# Patient Record
Sex: Male | Born: 1990
Health system: Southern US, Community
[De-identification: ages and names within clinical notes are randomized; demographics above are authoritative.]

## PROBLEM LIST (undated history)

## (undated) DIAGNOSIS — G8929 Other chronic pain: Secondary | ICD-10-CM

## (undated) DIAGNOSIS — F32A Depression, unspecified: Secondary | ICD-10-CM

## (undated) DIAGNOSIS — D696 Thrombocytopenia, unspecified: Secondary | ICD-10-CM

## (undated) DIAGNOSIS — G43909 Migraine, unspecified, not intractable, without status migrainosus: Secondary | ICD-10-CM

## (undated) DIAGNOSIS — D72819 Decreased white blood cell count, unspecified: Secondary | ICD-10-CM

## (undated) DIAGNOSIS — I10 Essential (primary) hypertension: Secondary | ICD-10-CM

## (undated) DIAGNOSIS — F419 Anxiety disorder, unspecified: Secondary | ICD-10-CM

## (undated) DIAGNOSIS — J45909 Unspecified asthma, uncomplicated: Secondary | ICD-10-CM

## (undated) DIAGNOSIS — IMO0002 Reserved for concepts with insufficient information to code with codable children: Secondary | ICD-10-CM

## (undated) DIAGNOSIS — M329 Systemic lupus erythematosus, unspecified: Secondary | ICD-10-CM

## (undated) DIAGNOSIS — M549 Dorsalgia, unspecified: Secondary | ICD-10-CM

## (undated) DIAGNOSIS — M069 Rheumatoid arthritis, unspecified: Secondary | ICD-10-CM

## (undated) DIAGNOSIS — R2990 Unspecified symptoms and signs involving the nervous system: Secondary | ICD-10-CM

## (undated) DIAGNOSIS — F329 Major depressive disorder, single episode, unspecified: Secondary | ICD-10-CM

## (undated) HISTORY — DX: Thrombocytopenia, unspecified: D69.6

## (undated) HISTORY — DX: Decreased white blood cell count, unspecified: D72.819

---
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
## 2013-09-04 ENCOUNTER — Telehealth: Payer: Self-pay | Admitting: Hematology & Oncology

## 2013-09-04 NOTE — Telephone Encounter (Signed)
I tried to call NEW PATIENT today to remind them of their appointment with Dr. Myna Hidalgo. Also, advised them to bring all medication bottles and insurance card information.  However, the number listed on file is disconnected at this time.

## 2013-09-07 ENCOUNTER — Encounter: Payer: Self-pay | Admitting: Hematology & Oncology

## 2013-09-07 ENCOUNTER — Ambulatory Visit (HOSPITAL_BASED_OUTPATIENT_CLINIC_OR_DEPARTMENT_OTHER): Payer: BC Managed Care – PPO | Admitting: Hematology & Oncology

## 2013-09-07 ENCOUNTER — Ambulatory Visit (HOSPITAL_BASED_OUTPATIENT_CLINIC_OR_DEPARTMENT_OTHER): Payer: BC Managed Care – PPO

## 2013-09-07 ENCOUNTER — Other Ambulatory Visit (HOSPITAL_BASED_OUTPATIENT_CLINIC_OR_DEPARTMENT_OTHER): Payer: BC Managed Care – PPO | Admitting: Lab

## 2013-09-07 VITALS — BP 126/64 | HR 61 | Temp 98.4°F | Resp 16 | Ht 64.0 in | Wt 191.0 lb

## 2013-09-07 DIAGNOSIS — D696 Thrombocytopenia, unspecified: Secondary | ICD-10-CM

## 2013-09-07 DIAGNOSIS — M329 Systemic lupus erythematosus, unspecified: Secondary | ICD-10-CM

## 2013-09-07 DIAGNOSIS — D72819 Decreased white blood cell count, unspecified: Secondary | ICD-10-CM

## 2013-09-07 HISTORY — DX: Decreased white blood cell count, unspecified: D72.819

## 2013-09-07 HISTORY — DX: Thrombocytopenia, unspecified: D69.6

## 2013-09-07 LAB — CBC WITH DIFFERENTIAL (CANCER CENTER ONLY)
BASO#: 0 10*3/uL (ref 0.0–0.2)
BASO%: 0 % (ref 0.0–2.0)
EOS%: 0.4 % (ref 0.0–7.0)
Eosinophils Absolute: 0 10*3/uL (ref 0.0–0.5)
HCT: 42.4 % (ref 38.7–49.9)
HGB: 15.3 g/dL (ref 13.0–17.1)
LYMPH#: 0.8 10*3/uL — ABNORMAL LOW (ref 0.9–3.3)
LYMPH%: 34.8 % (ref 14.0–48.0)
MCH: 30.8 pg (ref 28.0–33.4)
MCHC: 36.1 g/dL — ABNORMAL HIGH (ref 32.0–35.9)
MCV: 85 fL (ref 82–98)
MONO#: 0.2 10*3/uL (ref 0.1–0.9)
MONO%: 10.4 % (ref 0.0–13.0)
NEUT#: 1.3 10*3/uL — ABNORMAL LOW (ref 1.5–6.5)
NEUT%: 54.4 % (ref 40.0–80.0)
RBC: 4.97 10*6/uL (ref 4.20–5.70)
RDW: 11.4 % (ref 11.1–15.7)
WBC: 2.3 10*3/uL — AB (ref 4.0–10.0)

## 2013-09-07 LAB — CHCC SATELLITE - SMEAR

## 2013-09-07 LAB — TECHNOLOGIST REVIEW CHCC SATELLITE

## 2013-09-07 NOTE — Progress Notes (Signed)
Referral MD  Reason for Referral: Leukopenia and thrombocytopenia   Chief Complaint  Patient presents with  . NEW PATIENT  : I am here to see if I have lupus  HPI: [name] is a very nice 23 year old Afro-American gentleman. He apparently has a diagnosis of lupus. He is on Plaquenil and prednisone. He's been on these for about a year.  He has been noted to have some leukopenia and thrombocytopenia. He is followed by [facility]. He had some lab work done back in October and his white cell count was 2.4. Hemoglobin 14.2 and platelet count was 82. MCV was 86. He had a normal  white cell differential.  I don't have any additional lab work on him.  However, he was referred to [place] because of the blood counts and need out of [place] out of these.  He's had a problem infections. He's had no bleeding. He's had no bruises. He's had no change in medications.  He has been tested for HIV.  There is no on his blood disorder problems in the family. He does not know his father. He says mother was a foster child.  He's had no rashes. I think that the e lupus may have been diagnosed with he did have a facial rash.  Is no weight loss or weight gain. Is not a vegetarian. He's had no swollen lymph glands. He's had no cough. He does smoke. He does drink. He's had no leg swelling.   No past medical history on file.:  No past surgical history on file.:  Current outpatient prescriptions:HYDROXYCHLOROQUINE SULFATE PO, Take 200 mg by mouth every morning. 2 tabs in am = 400 mg, Disp: , Rfl: ;  prednisoLONE 5 MG TABS tablet, Take 5 mg by mouth daily., Disp: , Rfl: :  :  Allergies  Allergen Reactions  . Codeine Nausea And Vomiting    Pt thinks he is allergic to codeine--  :  No family history on file.:  History   Social History  . Marital Status: Single    Spouse Name: N/A    Number of Children: N/A  . Years of Education: N/A   Occupational History  . Not on file.    Social History Main Topics  . Smoking status: Current Every Day Smoker -- 2.00 packs/day for 3 years    Types: Cigars, Cigarettes    Start date: 04/09/2010  . Smokeless tobacco: Never Used     Comment: smokes 2 cigars daily  . Alcohol Use: Not on file  . Drug Use: Not on file  . Sexual Activity: Not on file   Other Topics Concern  . Not on file   Social History Narrative  . No narrative on file  :  Pertinent items are noted in HPI.  Exam: @IPVITALS @  well-developed and well-nourished Afro-American gentleman. His vital signs show a temperature of 98.4. Pulse 61. Blood pressure 126/64. Weight is 191 pounds. Head and neck exam shows Dr. oral lesions. Is no scleral icterus. There is no adenopathy in the neck. Lungs are clear. Cardiac exam regular in rhythm with no murmurs rubs or bruits. Abdomen is soft. Has good bowel sounds. There is no fluid wave. No palpable abdominal mass. There is no palpable liver or spleen tip. Back exam no tenderness over the spine ribs or hips. Extremities shows no clubbing cyanosis or edema. His good range of motion of his joints. Skin exam no rashes ecchymoses or petechia. Neurological exam shows no focal neurological deficits.  Recent Labs  09/07/13 1459  WBC 2.3*  HGB 15.3  HCT 42.4  PLT 113 Platelet count consistent in citrate*   No results found for this basename: NA, K, CL, CO2, GLUCOSE, BUN, CREATININE, CALCIUM,  in the last 72 hours  Blood smear review: Normochromic and normocytic red blood cells. There are no nucleated red cells. I see no rouleau formation. He has no target cells. I see no teardrop cells. There are no schistocytes or spherocytes. White cells are decreased in number. He has mature polys. I see no hypersegmented polys. There is no blasts. I see a couple atypical lymphocytes. Plans are mildly decreased in number. He has several large platelets. Platelets are well- granulated  Pathology: No data     Assessment and Plan: Mr.  Dan Adams is a very nice 23 year old African gentleman. He has lupus. He's on Plaquenil and prednisone.  His blood smear and is a pretty much unremarkable. He does have a leukopenia. I suspect that the leukopenia probably is more so related to his ethnicity. 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 This is a true hematologic disorder seen in about 20-25% of African Americans. It is not any type of precursor for any additional problems.  The thrombocytopenia and certainly could be from a collagen vascular disorder. By his blood smear, [place] think that he has a destructive or consumptive process. He has some large platelets. They are normal in appearance.  For now, he is a symptomatic. I don't see that his blood counts are going to be a factor. I don't believe that he would ever be syndromatic from the low white cells or low platelets.  He did not get a bone marrow test. I would not do any type of radiological studies. His exam premixes on reviewing.  I do want to see him back in about 3 months or so. I think if his blood counts are okay at that point on, and then we can probably let him go from the clinic.  I spent a good 45 minutes with him. I reviewed his lab work with him. I answered all his questions.

## 2013-12-09 ENCOUNTER — Other Ambulatory Visit: Payer: BC Managed Care – PPO | Admitting: Lab

## 2013-12-09 ENCOUNTER — Ambulatory Visit: Payer: BC Managed Care – PPO | Admitting: Hematology & Oncology

## 2014-08-09 ENCOUNTER — Telehealth: Payer: Self-pay | Admitting: Internal Medicine

## 2014-08-09 NOTE — Telephone Encounter (Signed)
OK. Thx

## 2014-08-09 NOTE — Telephone Encounter (Signed)
Patient's mother is a patient of yours and she would like to schedule her son with you as a new patient. He has been diagnosed with Lupus and needs a PCP.

## 2014-08-13 ENCOUNTER — Ambulatory Visit (INDEPENDENT_AMBULATORY_CARE_PROVIDER_SITE_OTHER): Payer: 59 | Admitting: Internal Medicine

## 2014-08-13 ENCOUNTER — Encounter: Payer: Self-pay | Admitting: Internal Medicine

## 2014-08-13 ENCOUNTER — Other Ambulatory Visit (INDEPENDENT_AMBULATORY_CARE_PROVIDER_SITE_OTHER): Payer: 59

## 2014-08-13 VITALS — BP 130/82 | HR 109 | Temp 98.9°F | Ht 64.0 in | Wt 186.0 lb

## 2014-08-13 DIAGNOSIS — E538 Deficiency of other specified B group vitamins: Secondary | ICD-10-CM

## 2014-08-13 DIAGNOSIS — M329 Systemic lupus erythematosus, unspecified: Secondary | ICD-10-CM

## 2014-08-13 DIAGNOSIS — E559 Vitamin D deficiency, unspecified: Secondary | ICD-10-CM | POA: Diagnosis not present

## 2014-08-13 LAB — URINALYSIS, ROUTINE W REFLEX MICROSCOPIC
Bilirubin Urine: NEGATIVE
Ketones, ur: NEGATIVE
Leukocytes, UA: NEGATIVE
Nitrite: NEGATIVE
PH: 6 (ref 5.0–8.0)
Specific Gravity, Urine: >=1.03 — AB (ref 1.000–1.030)
TOTAL PROTEIN, URINE-UPE24: 30 — AB
URINE GLUCOSE: NEGATIVE
Urobilinogen, UA: 0.2 (ref 0.0–1.0)

## 2014-08-13 LAB — HEPATIC FUNCTION PANEL
ALK PHOS: 45 U/L (ref 39–117)
ALT: 21 U/L (ref 0–53)
AST: 20 U/L (ref 0–37)
Albumin: 3.9 g/dL (ref 3.5–5.2)
Bilirubin, Direct: 0.1 mg/dL (ref 0.0–0.3)
TOTAL PROTEIN: 7.5 g/dL (ref 6.0–8.3)
Total Bilirubin: 0.6 mg/dL (ref 0.2–1.2)

## 2014-08-13 LAB — CBC WITH DIFFERENTIAL/PLATELET
BASOS PCT: 0.2 % (ref 0.0–3.0)
Basophils Absolute: 0 10*3/uL (ref 0.0–0.1)
EOS ABS: 0 10*3/uL (ref 0.0–0.7)
Eosinophils Relative: 0.1 % (ref 0.0–5.0)
HEMATOCRIT: 40 % (ref 39.0–52.0)
HEMOGLOBIN: 13.8 g/dL (ref 13.0–17.0)
LYMPHS ABS: 0.3 10*3/uL — AB (ref 0.7–4.0)
Lymphocytes Relative: 11.3 % — ABNORMAL LOW (ref 12.0–46.0)
MCHC: 34.5 g/dL (ref 30.0–36.0)
MCV: 85.5 fl (ref 78.0–100.0)
MONO ABS: 0.2 10*3/uL (ref 0.1–1.0)
Monocytes Relative: 5.7 % (ref 3.0–12.0)
Neutro Abs: 2.5 10*3/uL (ref 1.4–7.7)
Neutrophils Relative %: 82.7 % — ABNORMAL HIGH (ref 43.0–77.0)
Platelets: 263 10*3/uL (ref 150.0–400.0)
RBC: 4.67 Mil/uL (ref 4.22–5.81)
RDW: 12.8 % (ref 11.5–15.5)
WBC: 3 10*3/uL — ABNORMAL LOW (ref 4.0–10.5)

## 2014-08-13 LAB — VITAMIN D 25 HYDROXY (VIT D DEFICIENCY, FRACTURES): VITD: 13.3 ng/mL — ABNORMAL LOW (ref 30.00–100.00)

## 2014-08-13 LAB — BASIC METABOLIC PANEL
BUN: 18 mg/dL (ref 6–23)
CO2: 26 meq/L (ref 19–32)
CREATININE: 1.02 mg/dL (ref 0.40–1.50)
Calcium: 9.3 mg/dL (ref 8.4–10.5)
Chloride: 104 mEq/L (ref 96–112)
GFR: 114.62 mL/min (ref >60.00)
GLUCOSE: 102 mg/dL — AB (ref 70–99)
Potassium: 3.6 mEq/L (ref 3.5–5.1)
SODIUM: 139 meq/L (ref 135–145)

## 2014-08-13 LAB — VITAMIN B12: Vitamin B-12: 163 pg/mL — ABNORMAL LOW (ref 211–911)

## 2014-08-13 LAB — SEDIMENTATION RATE: Sed Rate: 41 mm/hr — ABNORMAL HIGH (ref 0–22)

## 2014-08-13 LAB — TSH: TSH: 1.13 u[IU]/mL (ref 0.35–4.50)

## 2014-08-13 NOTE — Progress Notes (Signed)
Pre visit review using our clinic review tool, if applicable. No additional management support is needed unless otherwise documented below in the visit note. 

## 2014-08-13 NOTE — Assessment & Plan Note (Addendum)
Lupus was dx'd in 2014. His rheumatologist is Dr Sharmon Revere in [facility]  Labs CXR EKG

## 2014-08-13 NOTE — Progress Notes (Signed)
Subjective:    Patient ID: Dan Adams, male    DOB: 05/21/1990, 24 y.o.   MRN: [id number]  HPI  New pt C/o "I have lupus" - needs a PCP (mom - Valerie)  Hx: Pt was loosing hair and had a face rash. He had skin bx by his dermatologist - lupus was dx'd in 2014. Pt developed itchy skin rash 2 wks ago on B arms - he needs an IV med;  He needs a PCP to monitor side effect. His rheumatologist is Dr Sharmon Revere in [facility]  C/o blurry vision x 1 wk - saw an eye dr - Dr Mardene Sayer in [place] - repeat exam on 5/25  Review of Systems  Constitutional: Positive for fatigue. Negative for chills, appetite change and unexpected weight change.  HENT: Negative for congestion, nosebleeds, sneezing, sore throat, trouble swallowing and voice change.   Eyes: Negative for itching and visual disturbance.  Respiratory: Negative for cough.   Cardiovascular: Negative for chest pain, palpitations and leg swelling.  Gastrointestinal: Negative for nausea, diarrhea, blood in stool and abdominal distention.  Genitourinary: Negative for frequency and hematuria.  Musculoskeletal: Positive for back pain, joint swelling and arthralgias. Negative for gait problem and neck pain.  Skin: Positive for rash.  Neurological: Negative for dizziness, tremors, speech difficulty and weakness.  Psychiatric/Behavioral: Negative for suicidal ideas, sleep disturbance, dysphoric mood and agitation. The patient is not nervous/anxious.    BP 130/82 mmHg  Pulse 109  Temp(Src) 98.9 F (37.2 C) (Oral)  Ht 5\' 4"  (1.626 m)  Wt 186 lb (84.369 kg)  BMI 31.91 kg/m2  SpO2 98%     Objective:   Physical Exam  Constitutional: He is oriented to person, place, and time. He appears well-developed. No distress.  NAD Obese a little  HENT:  Mouth/Throat: Oropharynx is clear and moist.  Eyes: Conjunctivae are normal. Pupils are equal, round, and reactive to light.  Neck: Normal range of motion. No JVD present. No thyromegaly present.    Cardiovascular: Normal rate, regular rhythm, normal heart sounds and intact distal pulses.  Exam reveals no gallop and no friction rub.   No murmur heard. Pulmonary/Chest: Effort normal and breath sounds normal. No respiratory distress. He has no wheezes. He has no rales. He exhibits no tenderness.  Abdominal: Soft. Bowel sounds are normal. He exhibits no distension and no mass. There is no tenderness. There is no rebound and no guarding.  Musculoskeletal: Normal range of motion. He exhibits tenderness. He exhibits no edema.  Lymphadenopathy:    He has no cervical adenopathy.  Neurological: He is alert and oriented to person, place, and time. He has normal reflexes. No cranial nerve deficit. He exhibits normal muscle tone. He displays a negative Romberg sign. Coordination and gait normal.  Skin: Skin is warm and dry. Rash noted.  Psychiatric: He has a normal mood and affect. His behavior is normal. Judgment and thought content normal.  There is papular-pustular rash on arms and forearms Multi-joints are tender LS is tender Nails discolored   Lab Results  Component Value Date   WBC 3.0* 08/13/2014   HGB 13.8 08/13/2014   HCT 40.0 08/13/2014   PLT 263.0 08/13/2014   GLUCOSE 102* 08/13/2014   ALT 21 08/13/2014   AST 20 08/13/2014   NA 139 08/13/2014   K 3.6 08/13/2014   CL 104 08/13/2014   CREATININE 1.02 08/13/2014   BUN 18 08/13/2014   CO2 26 08/13/2014   TSH 1.13 08/13/2014   Vit B12 163  Vit D 13.30  A complex case I spent >45 minutes with the patient and more than 50% of time was spent in counseling and coordination of care      Assessment & Plan:

## 2014-08-16 ENCOUNTER — Other Ambulatory Visit: Payer: Self-pay | Admitting: Internal Medicine

## 2014-08-16 MED ORDER — VITAMIN D 1000 UNITS PO TABS: 1000.0000 [IU] | ORAL_TABLET | Freq: Every day | ORAL | Status: DC

## 2014-08-16 MED ORDER — ERGOCALCIFEROL 1.25 MG (50000 UT) PO CAPS: 50000.0000 [IU] | ORAL_CAPSULE | ORAL | Status: DC

## 2014-08-16 MED ORDER — VITAMIN B-12 1000 MCG SL SUBL: 1.0000 | SUBLINGUAL_TABLET | Freq: Every day | SUBLINGUAL | Status: DC

## 2014-08-17 ENCOUNTER — Encounter: Payer: Self-pay | Admitting: Internal Medicine

## 2014-08-17 DIAGNOSIS — E559 Vitamin D deficiency, unspecified: Secondary | ICD-10-CM | POA: Insufficient documentation

## 2014-08-17 DIAGNOSIS — E538 Deficiency of other specified B group vitamins: Secondary | ICD-10-CM | POA: Insufficient documentation

## 2014-08-17 NOTE — Assessment & Plan Note (Signed)
5/16 new Start Vit B12 SL

## 2014-09-27 ENCOUNTER — Ambulatory Visit: Payer: 59 | Admitting: Internal Medicine

## 2014-10-13 ENCOUNTER — Ambulatory Visit (INDEPENDENT_AMBULATORY_CARE_PROVIDER_SITE_OTHER): Payer: 59 | Admitting: Internal Medicine

## 2014-10-13 ENCOUNTER — Encounter (INDEPENDENT_AMBULATORY_CARE_PROVIDER_SITE_OTHER): Payer: Self-pay

## 2014-10-13 ENCOUNTER — Encounter: Payer: Self-pay | Admitting: Internal Medicine

## 2014-10-13 VITALS — BP 152/94 | HR 83 | Temp 98.1°F | Resp 16 | Ht 65.0 in | Wt 191.1 lb

## 2014-10-13 DIAGNOSIS — E559 Vitamin D deficiency, unspecified: Secondary | ICD-10-CM | POA: Diagnosis not present

## 2014-10-13 DIAGNOSIS — E538 Deficiency of other specified B group vitamins: Secondary | ICD-10-CM

## 2014-10-13 DIAGNOSIS — I159 Secondary hypertension, unspecified: Secondary | ICD-10-CM | POA: Diagnosis not present

## 2014-10-13 MED ORDER — VITAMIN D3 50 MCG (2000 UT) PO CAPS: 2000.0000 [IU] | ORAL_CAPSULE | Freq: Every day | ORAL | Status: DC

## 2014-10-13 MED ORDER — LOSARTAN POTASSIUM 100 MG PO TABS: 100.0000 mg | ORAL_TABLET | Freq: Every day | ORAL | Status: DC

## 2014-10-13 MED ORDER — VITAMIN B-12 1000 MCG SL SUBL: 1.0000 | SUBLINGUAL_TABLET | Freq: Every day | SUBLINGUAL | Status: DC

## 2014-10-13 NOTE — Patient Instructions (Signed)

## 2014-10-13 NOTE — Progress Notes (Signed)
Subjective:  Patient ID: Dan Adams, male    DOB: 09/22/1990  Age: 24 y.o. MRN: [id number]  CC: No chief complaint on file.   HPI Durrell Sheley presents for elev SBP 160 earlier today while having a "lupus infusion". Pt was given Clonidine. Pt ran out of Vitamins...  Outpatient Prescriptions Prior to Visit  Medication Sig Dispense Refill  . HYDROXYCHLOROQUINE SULFATE PO Take 200 mg by mouth every morning. 2 tabs in am = 400 mg    . prednisoLONE 5 MG TABS tablet Take 5 mg by mouth daily.    . ergocalciferol (VITAMIN D2) 50000 UNITS capsule Take 1 capsule (50,000 Units total) by mouth once a week. (Patient not taking: Reported on 10/13/2014) 6 capsule 0  . cholecalciferol (VITAMIN D) 1000 UNITS tablet Take 1 tablet (1,000 Units total) by mouth daily. (Patient not taking: Reported on 10/13/2014) 100 tablet 3  . Cyanocobalamin (VITAMIN B-12) 1000 MCG SUBL Place 1 tablet (1,000 mcg total) under the tongue daily. (Patient not taking: Reported on 10/13/2014) 100 tablet 3   No facility-administered medications prior to visit.    ROS Review of Systems  Constitutional: Positive for fatigue. Negative for appetite change and unexpected weight change.  HENT: Negative for congestion, nosebleeds, sneezing, sore throat and trouble swallowing.   Eyes: Negative for itching and visual disturbance.  Respiratory: Negative for cough.   Cardiovascular: Negative for chest pain, palpitations and leg swelling.  Gastrointestinal: Negative for nausea, diarrhea, blood in stool and abdominal distention.  Genitourinary: Negative for frequency and hematuria.  Musculoskeletal: Negative for back pain, joint swelling, gait problem and neck pain.  Skin: Negative for rash.  Neurological: Negative for dizziness, tremors, speech difficulty and weakness.  Psychiatric/Behavioral: Negative for suicidal ideas, sleep disturbance, dysphoric mood and agitation. The patient is not nervous/anxious.     Objective:  BP  152/94 mmHg  Pulse 83  Temp(Src) 98.1 F (36.7 C) (Oral)  Resp 16  Ht 5\' 5"  (1.651 m)  Wt 191 lb 1.3 oz (86.673 kg)  BMI 31.80 kg/m2  SpO2 99%  BP Readings from Last 3 Encounters:  10/13/14 152/94  08/13/14 130/82  09/07/13 126/64    Wt Readings from Last 3 Encounters:  10/13/14 191 lb 1.3 oz (86.673 kg)  08/13/14 186 lb (84.369 kg)  09/07/13 191 lb (86.637 kg)    Physical Exam  Constitutional: He is oriented to person, place, and time. He appears well-developed. No distress.  Obese NAD  HENT:  Mouth/Throat: Oropharynx is clear and moist.  Eyes: Conjunctivae are normal. Pupils are equal, round, and reactive to light.  Neck: Normal range of motion. No JVD present. No thyromegaly present.  Cardiovascular: Normal rate, regular rhythm, normal heart sounds and intact distal pulses.  Exam reveals no gallop and no friction rub.   No murmur heard. Pulmonary/Chest: Effort normal and breath sounds normal. No respiratory distress. He has no wheezes. He has no rales. He exhibits no tenderness.  Abdominal: Soft. Bowel sounds are normal. He exhibits no distension and no mass. There is no tenderness. There is no rebound and no guarding.  Musculoskeletal: Normal range of motion. He exhibits no edema or tenderness.  Lymphadenopathy:    He has no cervical adenopathy.  Neurological: He is alert and oriented to person, place, and time. He has normal reflexes. No cranial nerve deficit. He exhibits normal muscle tone. He displays a negative Romberg sign. Coordination and gait normal.  Skin: Skin is warm and dry. No rash noted.  Psychiatric: He has  a normal mood and affect. His behavior is normal. Judgment and thought content normal.    Lab Results  Component Value Date   WBC 3.0* 08/13/2014   HGB 13.8 08/13/2014   HCT 40.0 08/13/2014   PLT 263.0 08/13/2014   GLUCOSE 102* 08/13/2014   ALT 21 08/13/2014   AST 20 08/13/2014   NA 139 08/13/2014   K 3.6 08/13/2014   CL 104 08/13/2014    CREATININE 1.02 08/13/2014   BUN 18 08/13/2014   CO2 26 08/13/2014   TSH 1.13 08/13/2014    No results found.  Assessment & Plan:   Diagnoses and all orders for this visit:  Vitamin B12 deficiency  Vitamin D deficiency  Hypertension, secondary  Other orders -     losartan (COZAAR) 100 MG tablet; Take 1 tablet (100 mg total) by mouth daily. -     Cyanocobalamin (VITAMIN B-12) 1000 MCG SUBL; Place 1 tablet (1,000 mcg total) under the tongue daily. -     Cholecalciferol (VITAMIN D3) 2000 UNITS capsule; Take 1 capsule (2,000 Units total) by mouth daily.  I have discontinued Mr. Macneal's cholecalciferol. I am also having him start on losartan and Vitamin D3. Additionally, I am having him maintain his prednisoLONE, HYDROXYCHLOROQUINE SULFATE PO, ergocalciferol, and Vitamin B-12.  Meds ordered this encounter  Medications  . losartan (COZAAR) 100 MG tablet    Sig: Take 1 tablet (100 mg total) by mouth daily.    Dispense:  30 tablet    Refill:  11  . Cyanocobalamin (VITAMIN B-12) 1000 MCG SUBL    Sig: Place 1 tablet (1,000 mcg total) under the tongue daily.    Dispense:  100 tablet    Refill:  3  . Cholecalciferol (VITAMIN D3) 2000 UNITS capsule    Sig: Take 1 capsule (2,000 Units total) by mouth daily.    Dispense:  100 capsule    Refill:  3     Follow-up: No Follow-up on file.  Sonda Primes, MD

## 2014-10-13 NOTE — Assessment & Plan Note (Signed)
Losartan 100 mg/d Labs in 2 wks

## 2014-10-13 NOTE — Assessment & Plan Note (Signed)
over-the-counter Vit D 2000 iu daily. Risks associated with treatment noncompliance were discussed. Compliance was encouraged.

## 2014-10-13 NOTE — Progress Notes (Signed)
Pre visit review using our clinic review tool, if applicable. No additional management support is needed unless otherwise documented below in the visit note. 

## 2014-10-13 NOTE — Assessment & Plan Note (Signed)
Risks associated with treatment noncompliance were discussed. Compliance was encouraged. 

## 2014-10-15 ENCOUNTER — Ambulatory Visit: Payer: 59 | Admitting: Internal Medicine

## 2014-10-27 ENCOUNTER — Ambulatory Visit (INDEPENDENT_AMBULATORY_CARE_PROVIDER_SITE_OTHER): Payer: 59 | Admitting: Internal Medicine

## 2014-10-27 ENCOUNTER — Encounter: Payer: Self-pay | Admitting: Internal Medicine

## 2014-10-27 ENCOUNTER — Other Ambulatory Visit (INDEPENDENT_AMBULATORY_CARE_PROVIDER_SITE_OTHER): Payer: 59

## 2014-10-27 VITALS — BP 164/92 | HR 95 | Temp 99.0°F | Resp 16 | Ht 64.0 in | Wt 187.0 lb

## 2014-10-27 DIAGNOSIS — M329 Systemic lupus erythematosus, unspecified: Secondary | ICD-10-CM

## 2014-10-27 DIAGNOSIS — E538 Deficiency of other specified B group vitamins: Secondary | ICD-10-CM

## 2014-10-27 DIAGNOSIS — I159 Secondary hypertension, unspecified: Secondary | ICD-10-CM

## 2014-10-27 DIAGNOSIS — E559 Vitamin D deficiency, unspecified: Secondary | ICD-10-CM

## 2014-10-27 LAB — URINALYSIS, ROUTINE W REFLEX MICROSCOPIC
BILIRUBIN URINE: NEGATIVE
Ketones, ur: NEGATIVE
LEUKOCYTES UA: NEGATIVE
Nitrite: NEGATIVE
Total Protein, Urine: 100 — AB
URINE GLUCOSE: NEGATIVE
UROBILINOGEN UA: 0.2 (ref 0.0–1.0)
pH: 6 (ref 5.0–8.0)

## 2014-10-27 MED ORDER — AMLODIPINE BESYLATE 5 MG PO TABS: 5.0000 mg | ORAL_TABLET | Freq: Every day | ORAL | Status: DC

## 2014-10-27 MED ORDER — VITAMIN B-12 1000 MCG SL SUBL: 1.0000 | SUBLINGUAL_TABLET | Freq: Every day | SUBLINGUAL | Status: DC

## 2014-10-27 MED ORDER — VITAMIN D3 50 MCG (2000 UT) PO CAPS: 2000.0000 [IU] | ORAL_CAPSULE | Freq: Every day | ORAL | Status: DC

## 2014-10-27 NOTE — Assessment & Plan Note (Signed)
Re- start Vit B12 °

## 2014-10-27 NOTE — Progress Notes (Signed)
Pre visit review using our clinic review tool, if applicable. No additional management support is needed unless otherwise documented below in the visit note. 

## 2014-10-27 NOTE — Assessment & Plan Note (Signed)
Tapering off Prednisone

## 2014-10-27 NOTE — Assessment & Plan Note (Signed)
Re-start Vit D 

## 2014-10-27 NOTE — Progress Notes (Signed)
Subjective:  Patient ID: Dan Adams, male    DOB: May 18, 1990  Age: 24 y.o. MRN: [id number]  CC: No chief complaint on file.   HPI Durrell Benecke presents for HTN. Prednisone is making BP go up. He is on 1 a day - planning to stop it per Rheumatology. C/o side effects w/Losartan - HA.  Pt is taking Zpac for bronchitis. He is concerned about having a UTI Pt is not taking Vit D, B12 for ?reason  Outpatient Prescriptions Prior to Visit  Medication Sig Dispense Refill  . HYDROXYCHLOROQUINE SULFATE PO Take 200 mg by mouth every morning. 2 tabs in am = 400 mg    . losartan (COZAAR) 100 MG tablet Take 1 tablet (100 mg total) by mouth daily. 30 tablet 11  . prednisoLONE 5 MG TABS tablet Take 5 mg by mouth daily.    . ergocalciferol (VITAMIN D2) 50000 UNITS capsule Take 1 capsule (50,000 Units total) by mouth once a week. (Patient not taking: Reported on 10/13/2014) 6 capsule 0  . Cholecalciferol (VITAMIN D3) 2000 UNITS capsule Take 1 capsule (2,000 Units total) by mouth daily. (Patient not taking: Reported on 10/27/2014) 100 capsule 3  . Cyanocobalamin (VITAMIN B-12) 1000 MCG SUBL Place 1 tablet (1,000 mcg total) under the tongue daily. (Patient not taking: Reported on 10/27/2014) 100 tablet 3   No facility-administered medications prior to visit.    ROS Review of Systems  Constitutional: Negative for appetite change, fatigue and unexpected weight change.  HENT: Negative for congestion, nosebleeds, sneezing, sore throat and trouble swallowing.   Eyes: Negative for itching and visual disturbance.  Respiratory: Positive for cough.   Cardiovascular: Negative for chest pain, palpitations and leg swelling.  Gastrointestinal: Negative for nausea, diarrhea, blood in stool and abdominal distention.  Genitourinary: Negative for frequency and hematuria.  Musculoskeletal: Negative for back pain, joint swelling, gait problem and neck pain.  Skin: Negative for rash.  Neurological: Negative for  dizziness, tremors, speech difficulty and weakness.  Psychiatric/Behavioral: Negative for sleep disturbance, dysphoric mood and agitation. The patient is not nervous/anxious.     Objective:  BP 164/92 mmHg  Pulse 95  Temp(Src) 99 F (37.2 C) (Oral)  Resp 16  Ht 5\' 4"  (1.626 m)  Wt 187 lb (84.823 kg)  BMI 32.08 kg/m2  SpO2 98%  BP Readings from Last 3 Encounters:  10/27/14 164/92  10/13/14 152/94  08/13/14 130/82    Wt Readings from Last 3 Encounters:  10/27/14 187 lb (84.823 kg)  10/13/14 191 lb 1.3 oz (86.673 kg)  08/13/14 186 lb (84.369 kg)    Physical Exam  Constitutional: He is oriented to person, place, and time. He appears well-developed. No distress.  NAD  HENT:  Mouth/Throat: Oropharynx is clear and moist. No oropharyngeal exudate.  Eyes: Conjunctivae are normal. Pupils are equal, round, and reactive to light.  Neck: Normal range of motion. No JVD present. No thyromegaly present.  Cardiovascular: Normal rate, regular rhythm, normal heart sounds and intact distal pulses.  Exam reveals no gallop and no friction rub.   No murmur heard. Pulmonary/Chest: Effort normal and breath sounds normal. No respiratory distress. He has no wheezes. He has no rales. He exhibits no tenderness.  Abdominal: Soft. Bowel sounds are normal. He exhibits no distension and no mass. There is no tenderness. There is no rebound and no guarding.  Musculoskeletal: Normal range of motion. He exhibits no edema or tenderness.  Lymphadenopathy:    He has no cervical adenopathy.  Neurological: He is  alert and oriented to person, place, and time. He has normal reflexes. No cranial nerve deficit. He exhibits normal muscle tone. He displays a negative Romberg sign. Coordination and gait normal.  Skin: Skin is warm and dry. Rash noted.  Psychiatric: He has a normal mood and affect. His behavior is normal. Judgment and thought content normal.  dry skin  Lab Results  Component Value Date   WBC 3.0*  08/13/2014   HGB 13.8 08/13/2014   HCT 40.0 08/13/2014   PLT 263.0 08/13/2014   GLUCOSE 102* 08/13/2014   ALT 21 08/13/2014   AST 20 08/13/2014   NA 139 08/13/2014   K 3.6 08/13/2014   CL 104 08/13/2014   CREATININE 1.02 08/13/2014   BUN 18 08/13/2014   CO2 26 08/13/2014   TSH 1.13 08/13/2014    No results found.  Assessment & Plan:   Diagnoses and all orders for this visit:  Hypertension, secondary Orders: -     Urinalysis; Future  Lupus (systemic lupus erythematosus)  Vitamin D deficiency  Vitamin B12 deficiency  Other orders -     amLODipine (NORVASC) 5 MG tablet; Take 1 tablet (5 mg total) by mouth daily. -     Cyanocobalamin (VITAMIN B-12) 1000 MCG SUBL; Place 1 tablet (1,000 mcg total) under the tongue daily. -     Cholecalciferol (VITAMIN D3) 2000 UNITS capsule; Take 1 capsule (2,000 Units total) by mouth daily.  I have discontinued Mr. Frieling's prednisoLONE and losartan. I am also having him start on amLODipine. Additionally, I am having him maintain his HYDROXYCHLOROQUINE SULFATE PO, ergocalciferol, azithromycin, HYDROcodone-homatropine, Vitamin B-12, and Vitamin D3.  Meds ordered this encounter  Medications  . azithromycin (ZITHROMAX Z-PAK) 250 MG tablet    Sig: Take 250 mg by mouth.  Marland Kitchen HYDROcodone-homatropine (HYCODAN) 5-1.5 MG/5ML syrup    Sig: Take 5 mLs by mouth every 4 (four) hours as needed.   Marland Kitchen amLODipine (NORVASC) 5 MG tablet    Sig: Take 1 tablet (5 mg total) by mouth daily.    Dispense:  30 tablet    Refill:  11  . Cyanocobalamin (VITAMIN B-12) 1000 MCG SUBL    Sig: Place 1 tablet (1,000 mcg total) under the tongue daily.    Dispense:  100 tablet    Refill:  3  . Cholecalciferol (VITAMIN D3) 2000 UNITS capsule    Sig: Take 1 capsule (2,000 Units total) by mouth daily.    Dispense:  100 capsule    Refill:  3     Follow-up: Return in about 2 months (around 12/28/2014) for a follow-up visit.  Sonda Primes, MD

## 2014-10-27 NOTE — Assessment & Plan Note (Signed)
D/c losartan Start Norvasc UA

## 2014-11-02 ENCOUNTER — Ambulatory Visit: Payer: 59 | Admitting: Internal Medicine

## 2014-12-26 ENCOUNTER — Emergency Department (HOSPITAL_BASED_OUTPATIENT_CLINIC_OR_DEPARTMENT_OTHER)
Admission: EM | Admit: 2014-12-26 | Discharge: 2014-12-26 | Disposition: A | Discharge status: Home / Self Care | Payer: 59 | Attending: Emergency Medicine | Admitting: Emergency Medicine

## 2014-12-26 ENCOUNTER — Encounter (HOSPITAL_BASED_OUTPATIENT_CLINIC_OR_DEPARTMENT_OTHER): Payer: Self-pay | Admitting: Emergency Medicine

## 2014-12-26 DIAGNOSIS — Z862 Personal history of diseases of the blood and blood-forming organs and certain disorders involving the immune mechanism: Secondary | ICD-10-CM | POA: Diagnosis not present

## 2014-12-26 DIAGNOSIS — R21 Rash and other nonspecific skin eruption: Secondary | ICD-10-CM | POA: Diagnosis not present

## 2014-12-26 DIAGNOSIS — I10 Essential (primary) hypertension: Secondary | ICD-10-CM | POA: Diagnosis not present

## 2014-12-26 DIAGNOSIS — Z87891 Personal history of nicotine dependence: Secondary | ICD-10-CM | POA: Diagnosis not present

## 2014-12-26 DIAGNOSIS — M329 Systemic lupus erythematosus, unspecified: Secondary | ICD-10-CM

## 2014-12-26 DIAGNOSIS — K121 Other forms of stomatitis: Secondary | ICD-10-CM | POA: Insufficient documentation

## 2014-12-26 DIAGNOSIS — Z79899 Other long term (current) drug therapy: Secondary | ICD-10-CM | POA: Insufficient documentation

## 2014-12-26 DIAGNOSIS — Z7952 Long term (current) use of systemic steroids: Secondary | ICD-10-CM | POA: Diagnosis not present

## 2014-12-26 HISTORY — DX: Reserved for concepts with insufficient information to code with codable children: IMO0002

## 2014-12-26 HISTORY — DX: Essential (primary) hypertension: I10

## 2014-12-26 HISTORY — DX: Systemic lupus erythematosus, unspecified: M32.9

## 2014-12-26 LAB — CBC WITH DIFFERENTIAL/PLATELET
Band Neutrophils: 3 %
Basophils Absolute: 0 10*3/uL (ref 0.0–0.1)
Basophils Relative: 0 %
Blasts: 0 %
Eosinophils Absolute: 0 10*3/uL (ref 0.0–0.7)
Eosinophils Relative: 0 %
HCT: 37.9 % — ABNORMAL LOW (ref 39.0–52.0)
Hemoglobin: 12.5 g/dL — ABNORMAL LOW (ref 13.0–17.0)
Lymphocytes Relative: 6 %
Lymphs Abs: 0.2 10*3/uL — ABNORMAL LOW (ref 0.7–4.0)
MCH: 28.2 pg (ref 26.0–34.0)
MCHC: 33 g/dL (ref 30.0–36.0)
MCV: 85.6 fL (ref 78.0–100.0)
Metamyelocytes Relative: 1 %
Monocytes Absolute: 0.1 10*3/uL (ref 0.1–1.0)
Monocytes Relative: 2 %
Myelocytes: 2 %
Neutro Abs: 2.8 10*3/uL (ref 1.7–7.7)
Neutrophils Relative %: 86 %
Platelets: 188 10*3/uL (ref 150–400)
Promyelocytes Absolute: 0 %
RBC: 4.43 MIL/uL (ref 4.22–5.81)
RDW: 11.9 % (ref 11.5–15.5)
WBC: 3.1 10*3/uL — ABNORMAL LOW (ref 4.0–10.5)
nRBC: 0 /100 WBC

## 2014-12-26 LAB — BASIC METABOLIC PANEL
ANION GAP: 10 (ref 5–15)
BUN: 18 mg/dL (ref 6–20)
CHLORIDE: 101 mmol/L (ref 101–111)
CO2: 26 mmol/L (ref 22–32)
Calcium: 8.9 mg/dL (ref 8.9–10.3)
Creatinine, Ser: 0.9 mg/dL (ref 0.61–1.24)
GFR calc Af Amer: >60 mL/min (ref >60)
GFR calc non Af Amer: >60 mL/min (ref >60)
GLUCOSE: 104 mg/dL — AB (ref 65–99)
POTASSIUM: 4.1 mmol/L (ref 3.5–5.1)
Sodium: 137 mmol/L (ref 135–145)

## 2014-12-26 LAB — CK: Total CK: 148 U/L (ref 49–397)

## 2014-12-26 LAB — SEDIMENTATION RATE: Sed Rate: 58 mm/hr — ABNORMAL HIGH (ref 0–16)

## 2014-12-26 MED ORDER — PREDNISONE 50 MG PO TABS: 50.0000 mg | ORAL_TABLET | Freq: Every day | ORAL | Status: DC

## 2014-12-26 NOTE — ED Notes (Signed)
Pt reports weight loss of 30 lb in last 3 weeks

## 2014-12-26 NOTE — ED Provider Notes (Signed)
CSN: [id number]     Arrival date & time 12/26/14  1321 History   First MD Initiated Contact with Patient 12/26/14 1403     Chief Complaint  Patient presents with  . Rash     (Consider location/radiation/quality/duration/timing/severity/associated sxs/prior Treatment) HPI Dan Adams is a 24 y.o. male with history of lupus, thrombocytopenia, leukopenia, hypertension, presents to emergency department complaining of swelling to the right face, paresthesia to the right arm, rash. Patient states that he has poorly controlled lupus, his followed by rheumatologist and primary care doctor. He's currently taking hydroxychloroquine and prednisone. Patient states he developed worsening rash to his skin and his lips about a week and a half ago. He was seen by his rheumatologist and was told to discontinue his medications. He was given some ointment to put on his lips but states it did not help. Mother states she has been putting some bacitracin to the lips. States it is improving. The reason they came to the ER is because patient woke up from a nap and they noticed he had swelling to the right cheek and tingling to the right arm. Patient states symptoms have now resolved. Episode started 2 hours ago. He currently is asymptomatic other than this rash that he has had for several weeks. Patient denies any fever or chills. He states he has had no appetite and lost 40 pounds in last 2 weeks. His mother states that she is looking for other rheumatologist and has done some online research and is looking into fetal stem cell transplant. I explained to her I am not familiar with those treatments.   Past Medical History  Diagnosis Date  . Leukopenia 09/07/2013  . Thrombocytopenia, unspecified 09/07/2013  . Lupus   . Hypertension    Past Surgical History  Procedure Laterality Date  . Tonsillectomy and adenoidectomy  2007   History reviewed. No pertinent family history. Social History  Substance Use Topics  .  Smoking status: Former Smoker -- 2.00 packs/day for 3 years    Types: Cigars, Cigarettes    Start date: 04/09/2010  . Smokeless tobacco: Never Used     Comment: smokes 2 cigars daily  . Alcohol Use: No    Review of Systems  Constitutional: Negative for fever and chills.  HENT: Positive for facial swelling.   Respiratory: Negative for cough, chest tightness and shortness of breath.   Cardiovascular: Negative for chest pain, palpitations and leg swelling.  Gastrointestinal: Negative for nausea, vomiting, abdominal pain, diarrhea and abdominal distention.  Musculoskeletal: Negative for myalgias, arthralgias, neck pain and neck stiffness.  Skin: Positive for rash.  Allergic/Immunologic: Negative for immunocompromised state.  Neurological: Positive for numbness. Negative for dizziness, weakness, light-headedness and headaches.  All other systems reviewed and are negative.     Allergies  Codeine; Losartan; and Promethazine  Home Medications   Prior to Admission medications   Medication Sig Start Date End Date Taking? Authorizing Provider  prednisoLONE 5 MG TABS tablet Take by mouth.   Yes Historical Provider, MD  amLODipine (NORVASC) 5 MG tablet Take 1 tablet (5 mg total) by mouth daily. 10/27/14   Aleksei Plotnikov V, MD  Cholecalciferol (VITAMIN D3) 2000 UNITS capsule Take 1 capsule (2,000 Units total) by mouth daily. 10/27/14   Aleksei Plotnikov V, MD  Cyanocobalamin (VITAMIN B-12) 1000 MCG SUBL Place 1 tablet (1,000 mcg total) under the tongue daily. 10/27/14   Aleksei Plotnikov V, MD  ergocalciferol (VITAMIN D2) 50000 UNITS capsule Take 1 capsule (50,000 Units total) by mouth  once a week. Patient not taking: Reported on 10/13/2014 08/16/14   Tresa Garter, MD  HYDROXYCHLOROQUINE SULFATE PO Take 200 mg by mouth every morning. 2 tabs in am = 400 mg    Historical Provider, MD   BP 158/99 mmHg  Pulse 92  Temp(Src) 98.6 F (37 C) (Oral)  Resp 18  Ht 5\' 5"  (1.651 m)  Wt 163 lb  (73.936 kg)  BMI 27.12 kg/m2  SpO2 100% Physical Exam  Constitutional: He is oriented to person, place, and time. He appears well-developed and well-nourished. No distress.  HENT:  Head: Normocephalic and atraumatic.  Right Ear: External ear normal.  Left Ear: External ear normal.  Nose: Nose normal.  Mouth/Throat: Oropharynx is clear and moist.  Bloody ulcerations to bilateral lips, ulcers noted to the right cheek. No facial swelling.  Eyes: Conjunctivae and EOM are normal. Pupils are equal, round, and reactive to light.  Neck: Neck supple.  Cardiovascular: Normal rate, regular rhythm and normal heart sounds.   Pulmonary/Chest: Effort normal. No respiratory distress. He has no wheezes. He has no rales.  Abdominal: Soft. Bowel sounds are normal. He exhibits no distension. There is no tenderness. There is no rebound.  Musculoskeletal: He exhibits no edema.  Full range of motion of all extremities.  Neurological: He is alert and oriented to person, place, and time.  Phone 5 and equal strength in bilateral upper lobe series. Sensation is intact in all dermatomes of the bilateral upper extremities. Pt is able to make fists, spread all fingers, oppose thumb to every other finger  Skin: Skin is warm and dry.  Diffuse erythematous papular rash.  Nursing note and vitals reviewed.   ED Course  Procedures (including critical care time) Labs Review Labs Reviewed  CBC WITH DIFFERENTIAL/PLATELET - Abnormal; Notable for the following:    WBC 3.1 (*)    Hemoglobin 12.5 (*)    HCT 37.9 (*)    Lymphs Abs 0.2 (*)    All other components within normal limits  BASIC METABOLIC PANEL - Abnormal; Notable for the following:    Glucose, Bld 104 (*)    All other components within normal limits  SEDIMENTATION RATE - Abnormal; Notable for the following:    Sed Rate 58 (*)    All other components within normal limits  CK    Imaging Review No results found. I have personally reviewed and evaluated  these images and lab results as part of my medical decision-making.   EKG Interpretation None      MDM   Final diagnoses:  Rash  Lupus  Stomatitis    Patient with right facial swelling and right arm paresthesias that started 2 hours prior to coming in, resolved upon presentation. Patient also complaining of rash that is part of his lupus exacerbation. Patient is followed by rheumatologist, however mother expressed that she may go somewhere else for second opinion. At this time patient does not appear to be toxic, he is afebrile, he is in no distress, he has no complaints other than rash and blisters to his lips which is a recurrent symptom for him. There has not been any new medications. I do not think this is a reaction to medications or Johnson's. Patient states he has had similar blisters in the past. He has no respiratory problems. His neurological exam is unremarkable. I discussed with Dr. Viviann Spare who has seen patient as well. We will get labs including sedimentation rate and CK level.  5:31 PM Patient's labs  show a slightly low white count of 3.1, he has history of the same. His sedimentation rate is slightly elevated at 58. Otherwise labs are unremarkable. I will start him on higher dose of prednisone burst, for 5 days, then continue regular prednisone dosage. Also advised to follow-up with her rheumatologist. Mother voiced understanding, patient was understanding as well.  Filed Vitals:   12/26/14 1327 12/26/14 1730  BP: 158/99 161/102  Pulse: 92 84  Temp: 98.6 F (37 C)   TempSrc: Oral   Resp: 18 18  Height: 5\' 5"  (1.651 m)   Weight: 163 lb (73.936 kg)   SpO2: 100% 99%        Jaynie Crumble, PA-C 12/26/14 1732  Rolland Porter, MD 12/29/14 1512

## 2014-12-26 NOTE — ED Notes (Signed)
Pt reports itching to right arm, face tingling and swelling, pt with hx of lupus, multiple raised areas to bilateral arms with hive like areas, pt states they have been present for days

## 2014-12-26 NOTE — ED Provider Notes (Signed)
Patient seen and evaluated. Nonspecific ulcerations. Cracked and bleeding on the lips. Submucosal intraoral. No demonstrable weakness. No demonstrable swelling. No neurological findings or symptoms. Plan will be CPK ,sedimentation rate, lab evaluation.  Rolland Porter, MD 12/26/14 1520

## 2014-12-26 NOTE — Discharge Instructions (Signed)
Take high-dose prednisone for the next 5 days as prescribed. Continue your regular prednisone dose afterwards. Follow-up with rheumatologist  Lupus Lupus (also called systemic lupus erythematosus, SLE) is a disorder of the body's natural defense system (immune system). In lupus, the immune system attacks various areas of the body (autoimmune disease). CAUSES The cause is unknown. However, lupus runs in families. Certain genes can make you more likely to develop lupus. It is 10 times more common in women than in men. Lupus is also more common in African Americans and Asians. Other factors also play a role, such as viruses (Epstein-Barr virus, EBV), stress, hormones, cigarette smoke, and certain drugs. SYMPTOMS Lupus can affect many parts of the body, including the joints, skin, kidneys, lungs, heart, nervous system, and blood vessels. The signs and symptoms of lupus differ from person to person. The disease can range from mild to life-threatening. Typical features of lupus include:  Butterfly-shaped rash over the face.  Arthritis involving one or more joints.  Kidney disease.  Fever, weight loss, hair loss, fatigue.  Poor circulation in the fingers and toes (Raynaud's disease).  Chest pain when taking deep breaths. Abdominal pain may also occur.  Skin rash in areas exposed to the sun.  Sores in the mouth and nose. DIAGNOSIS Diagnosing lupus can take a long time and is often difficult. An exam and an accurate account of your symptoms and health problems is very important. Blood tests are necessary, though no single test can confirm or rule out lupus. Most people with lupus test positive for antinuclear antibodies (ANA) on a blood test. Additional blood tests, a urine test (urinalysis), and sometimes a kidney or skin tissue sample (biopsy) can help to confirm or rule out lupus. TREATMENT There is no cure for lupus. Your caregiver will develop a treatment plan based on your age, sex, health,  symptoms, and lifestyle. The goals are to prevent flares, to treat them when they do occur, and to minimize organ damage and complications. How the disease may affect each person varies widely. Most people with lupus can live normal lives, but this disorder must be carefully monitored. Treatment must be adjusted as necessary to prevent serious complications. Medicines used for treatment:  Nonsteroidal anti-inflammatory drugs (NSAIDs) decrease inflammation and can help with chest pain, joint pain, and fevers. Examples include ibuprofen and naproxen.  Antimalarial drugs were designed to treat malaria. They also treat fatigue, joint pain, skin rashes, and inflammation of the lungs in patients with lupus.  Corticosteroids are powerful hormones that rapidly suppress inflammation. The lowest dose with the highest benefit will be chosen. They can be given by cream, pills, injections, and through the vein (intravenously).  Immunosuppressive drugs block the making of immune cells. They may be used for kidney or nerve disease. HOME CARE INSTRUCTIONS  Exercise. Low-impact activities can usually help keep joints flexible without being too strenuous.  Rest after periods of exercise.  Avoid excessive sun exposure.  Follow proper nutrition and take supplements as recommended by your caregiver.  Stress management can be helpful. SEEK MEDICAL CARE IF:  You have increased fatigue.  You develop pain.  You develop a rash.  You have an oral temperature above 102 F (38.9 C).  You develop abdominal discomfort.  You develop a headache.  You experience dizziness. FOR MORE INFORMATION National Institute of Neurological Disorders and Stroke: [employer] of Rheumatology: www.rheumatology.[facility] of Arthritis and Musculoskeletal and Skin Diseases: www.niams.http://www.myers.net/ Document Released: 03/16/2002 Document Revised: 06/18/2011 Document Reviewed: 07/07/2009  [employer]®  Patient Information ©2015 [employer]. This information is not intended to replace advice given to you by your health care provider. Make sure you discuss any questions you have with your health care provider. ° °

## 2014-12-28 ENCOUNTER — Ambulatory Visit (INDEPENDENT_AMBULATORY_CARE_PROVIDER_SITE_OTHER): Payer: 59 | Admitting: Internal Medicine

## 2014-12-28 ENCOUNTER — Encounter: Payer: Self-pay | Admitting: Internal Medicine

## 2014-12-28 VITALS — BP 150/98 | HR 88 | Wt 166.0 lb

## 2014-12-28 DIAGNOSIS — Z9119 Patient's noncompliance with other medical treatment and regimen: Secondary | ICD-10-CM | POA: Diagnosis not present

## 2014-12-28 DIAGNOSIS — E559 Vitamin D deficiency, unspecified: Secondary | ICD-10-CM

## 2014-12-28 DIAGNOSIS — M329 Systemic lupus erythematosus, unspecified: Secondary | ICD-10-CM

## 2014-12-28 DIAGNOSIS — Z91199 Patient's noncompliance with other medical treatment and regimen due to unspecified reason: Secondary | ICD-10-CM | POA: Insufficient documentation

## 2014-12-28 DIAGNOSIS — K121 Other forms of stomatitis: Secondary | ICD-10-CM | POA: Insufficient documentation

## 2014-12-28 DIAGNOSIS — I159 Secondary hypertension, unspecified: Secondary | ICD-10-CM

## 2014-12-28 MED ORDER — PREDNISONE 10 MG PO TABS: ORAL_TABLET | ORAL | Status: DC

## 2014-12-28 MED ORDER — AMLODIPINE BESYLATE 5 MG PO TABS: 5.0000 mg | ORAL_TABLET | Freq: Every day | ORAL | Status: DC

## 2014-12-28 MED ORDER — ACYCLOVIR 400 MG PO TABS: 400.0000 mg | ORAL_TABLET | Freq: Three times a day (TID) | ORAL | Status: DC

## 2014-12-28 MED ORDER — TRIAMCINOLONE ACETONIDE 0.1 % EX OINT: 1.0000 "application " | TOPICAL_OINTMENT | Freq: Two times a day (BID) | CUTANEOUS | Status: DC

## 2014-12-28 MED ORDER — VITAMIN D3 50 MCG (2000 UT) PO CAPS: 2000.0000 [IU] | ORAL_CAPSULE | Freq: Every day | ORAL | Status: DC

## 2014-12-28 MED ORDER — VITAMIN B-12 1000 MCG SL SUBL: 1.0000 | SUBLINGUAL_TABLET | Freq: Every day | SUBLINGUAL | Status: DC

## 2014-12-28 NOTE — Assessment & Plan Note (Addendum)
9/16 B multiple ?lupus related vs other. Empiric acyclovir Kenalog oint qid

## 2014-12-28 NOTE — Progress Notes (Signed)
Subjective:  Patient ID: Dan Adams, male    DOB: 02-Apr-1991  Age: 24 y.o. MRN: [id number]  CC: No chief complaint on file.   HPI Dan Adams presents for ER visit f/u for paresthesia on 9/18: "Dan Adams is a 24 y.o. male with history of lupus, thrombocytopenia, leukopenia, hypertension, presents to emergency department complaining of swelling to the right face, paresthesia to the right arm, rash. Patient states that he has poorly controlled lupus, his followed by rheumatologist and primary care doctor. He's currently taking hydroxychloroquine and prednisone. Patient states he developed worsening rash to his skin and his lips about a week and a half ago. He was seen by his rheumatologist and was told to discontinue his medications. He was given some ointment to put on his lips but states it did not help. Mother states she has been putting some bacitracin to the lips. States it is improving. The reason they came to the ER is because patient woke up from a nap and they noticed he had swelling to the right cheek and tingling to the right arm. Patient states symptoms have now resolved. Episode started 2 hours ago. He currently is asymptomatic other than this rash that he has had for several weeks. Patient denies any fever or chills. He states he has had no appetite and lost 40 pounds in last 2 weeks. His mother states that she is looking for other rheumatologist and has done some online research and is looking into fetal stem cell transplant. I explained to her I am not familiar with those treatments." " Rash  Lupus  Stomatitis    Patient with right facial swelling and right arm paresthesias that started 2 hours prior to coming in, resolved upon presentation. Patient also complaining of rash that is part of his lupus exacerbation. Patient is followed by rheumatologist, however mother expressed that she may go somewhere else for second opinion. At this time patient does not appear to  be toxic, he is afebrile, he is in no distress, he has no complaints other than rash and blisters to his lips which is a recurrent symptom for him. There has not been any new medications. I do not think this is a reaction to medications or Dan Adams. Patient states he has had similar blisters in the past. He has no respiratory problems. His neurological exam is unremarkable. I discussed with Dr. Fayrene Fearing who has seen patient as well. We will get labs including sedimentation rate and CK level.  5:31 PM Patient's labs show a slightly low white count of 3.1, he has history of the same. His sedimentation rate is slightly elevated at 58. Otherwise labs are unremarkable. I will start him on higher dose of prednisone burst, for 5 days, then continue regular prednisone dosage. Also advised to follow-up with her rheumatologist. Mother voiced understanding, patient was understanding as well.''     Dan Adams ran out of Norvasc 1 mo ago,  Not taking B12. C/o mouth sores x 1 week - B lips  Outpatient Prescriptions Prior to Visit  Medication Sig Dispense Refill  . HYDROXYCHLOROQUINE SULFATE PO Take 200 mg by mouth every morning. 2 tabs in am = 400 mg    . amLODipine (NORVASC) 5 MG tablet Take 1 tablet (5 mg total) by mouth daily. 30 tablet 11  . Cholecalciferol (VITAMIN D3) 2000 UNITS capsule Take 1 capsule (2,000 Units total) by mouth daily. 100 capsule 3  . Cyanocobalamin (VITAMIN B-12) 1000 MCG SUBL Place 1 tablet (1,000 mcg total)  under the tongue daily. 100 tablet 3  . ergocalciferol (VITAMIN D2) 50000 UNITS capsule Take 1 capsule (50,000 Units total) by mouth once a week. 6 capsule 0  . prednisoLONE 5 MG TABS tablet Take by mouth.    . predniSONE (DELTASONE) 50 MG tablet Take 1 tablet (50 mg total) by mouth daily. 5 tablet 0   No facility-administered medications prior to visit.    ROS Review of Systems  Constitutional: Negative for appetite change, fatigue and unexpected weight change.  HENT:  Negative for congestion, nosebleeds, sneezing, sore throat and trouble swallowing.   Eyes: Negative for itching and visual disturbance.  Respiratory: Negative for cough.   Cardiovascular: Negative for chest pain, palpitations and leg swelling.  Gastrointestinal: Negative for nausea, diarrhea, blood in stool and abdominal distention.  Genitourinary: Negative for frequency and hematuria.  Musculoskeletal: Negative for back pain, joint swelling, gait problem and neck pain.  Skin: Positive for rash.  Neurological: Positive for numbness. Negative for dizziness, tremors, speech difficulty and weakness.  Psychiatric/Behavioral: Negative for sleep disturbance, dysphoric mood and agitation. The patient is not nervous/anxious.     Objective:  BP 150/98 mmHg  Pulse 88  Wt 166 lb (75.297 kg)  SpO2 98%  BP Readings from Last 3 Encounters:  12/28/14 150/98  12/26/14 161/102  10/27/14 164/92    Wt Readings from Last 3 Encounters:  12/28/14 166 lb (75.297 kg)  12/26/14 163 lb (73.936 kg)  10/27/14 187 lb (84.823 kg)    Physical Exam  Constitutional: He is oriented to person, place, and time. He appears well-developed. No distress.  NAD  HENT:  Mouth/Throat: Oropharyngeal exudate present.  Eyes: Conjunctivae are normal. Pupils are equal, round, and reactive to light.  Neck: Normal range of motion. No JVD present. No thyromegaly present.  Cardiovascular: Normal rate, regular rhythm, normal heart sounds and intact distal pulses.  Exam reveals no gallop and no friction rub.   No murmur heard. Pulmonary/Chest: Effort normal and breath sounds normal. No respiratory distress. He has no wheezes. He has no rales. He exhibits no tenderness.  Abdominal: Soft. Bowel sounds are normal. He exhibits no distension and no mass. There is no tenderness. There is no rebound and no guarding.  Musculoskeletal: Normal range of motion. He exhibits no edema or tenderness.  Lymphadenopathy:    He has no cervical  adenopathy.  Neurological: He is alert and oriented to person, place, and time. He has normal reflexes. No cranial nerve deficit. He exhibits normal muscle tone. He displays a negative Romberg sign. Coordination and gait normal.  Skin: Skin is warm and dry. No rash noted.  Psychiatric: He has a normal mood and affect. His behavior is normal. Judgment and thought content normal.  bloody crusts on B inner lips and B inner cheeks erosions MS OK  Lab Results  Component Value Date   WBC 3.1* 12/26/2014   HGB 12.5* 12/26/2014   HCT 37.9* 12/26/2014   PLT 188 12/26/2014   GLUCOSE 104* 12/26/2014   ALT 21 08/13/2014   AST 20 08/13/2014   NA 137 12/26/2014   K 4.1 12/26/2014   CL 101 12/26/2014   CREATININE 0.90 12/26/2014   BUN 18 12/26/2014   CO2 26 12/26/2014   TSH 1.13 08/13/2014   A complex case  No results found.  Assessment & Plan:   Diagnoses and all orders for this visit:  Mouth ulcers  Non compliance with medical treatment  Lupus (systemic lupus erythematosus) -     Ambulatory  referral to Rheumatology  Vitamin D deficiency  Hypertension, secondary  Other orders -     predniSONE (DELTASONE) 10 MG tablet; Prednisone 10 mg: take 4 tabs a day x 3 days; then 3 tabs a day x 4 days; then 2 tabs a day x 4 days, then 1 tab a day. Take pc. -     triamcinolone ointment (KENALOG) 0.1 %; Apply 1 application topically 2 (two) times daily. To lips qid -     Cyanocobalamin (VITAMIN B-12) 1000 MCG SUBL; Place 1 tablet (1,000 mcg total) under the tongue daily. -     Cholecalciferol (VITAMIN D3) 2000 UNITS capsule; Take 1 capsule (2,000 Units total) by mouth daily. -     amLODipine (NORVASC) 5 MG tablet; Take 1 tablet (5 mg total) by mouth daily. -     acyclovir (ZOVIRAX) 400 MG tablet; Take 1 tablet (400 mg total) by mouth 3 (three) times daily.  I have discontinued Dan Adams's ergocalciferol, prednisoLONE, and predniSONE. I am also having him start on predniSONE, triamcinolone  ointment, and acyclovir. Additionally, I am having him maintain his HYDROXYCHLOROQUINE SULFATE PO, Vitamin B-12, Vitamin D3, and amLODipine.  Meds ordered this encounter  Medications  . predniSONE (DELTASONE) 10 MG tablet    Sig: Prednisone 10 mg: take 4 tabs a day x 3 days; then 3 tabs a day x 4 days; then 2 tabs a day x 4 days, then 1 tab a day. Take pc.    Dispense:  120 tablet    Refill:  1  . triamcinolone ointment (KENALOG) 0.1 %    Sig: Apply 1 application topically 2 (two) times daily. To lips qid    Dispense:  60 g    Refill:  1  . Cyanocobalamin (VITAMIN B-12) 1000 MCG SUBL    Sig: Place 1 tablet (1,000 mcg total) under the tongue daily.    Dispense:  100 tablet    Refill:  3  . Cholecalciferol (VITAMIN D3) 2000 UNITS capsule    Sig: Take 1 capsule (2,000 Units total) by mouth daily.    Dispense:  100 capsule    Refill:  3  . amLODipine (NORVASC) 5 MG tablet    Sig: Take 1 tablet (5 mg total) by mouth daily.    Dispense:  30 tablet    Refill:  11  . acyclovir (ZOVIRAX) 400 MG tablet    Sig: Take 1 tablet (400 mg total) by mouth 3 (three) times daily.    Dispense:  21 tablet    Refill:  3     Follow-up: Return in about 4 weeks (around 01/25/2015) for a follow-up visit.  Sonda Primes, MD

## 2014-12-28 NOTE — Progress Notes (Signed)
Pre visit review using our clinic review tool, if applicable. No additional management support is needed unless otherwise documented below in the visit note. 

## 2014-12-28 NOTE — Assessment & Plan Note (Addendum)
Pt asked to be refered to Dan Adams Will taper Predn from 50 mg/d to 10 mg/d pc

## 2014-12-28 NOTE — Assessment & Plan Note (Signed)
Re-start Amlodipine Risks associated with treatment noncompliance were discussed. Compliance was encouraged. 

## 2014-12-28 NOTE — Assessment & Plan Note (Signed)
Dan Adams ran out of Norvasc 1 mo ago,  Not taking B12. Risks associated with treatment noncompliance were discussed. Compliance was encouraged.

## 2014-12-28 NOTE — Assessment & Plan Note (Signed)
Vit D 

## 2015-01-20 ENCOUNTER — Ambulatory Visit: Payer: 59 | Admitting: Internal Medicine

## 2015-01-20 DIAGNOSIS — Z0289 Encounter for other administrative examinations: Secondary | ICD-10-CM

## 2015-01-28 ENCOUNTER — Ambulatory Visit: Payer: 59 | Admitting: Internal Medicine

## 2015-01-28 DIAGNOSIS — Z0289 Encounter for other administrative examinations: Secondary | ICD-10-CM

## 2015-02-03 ENCOUNTER — Ambulatory Visit (INDEPENDENT_AMBULATORY_CARE_PROVIDER_SITE_OTHER): Payer: 59 | Admitting: Internal Medicine

## 2015-02-03 ENCOUNTER — Encounter: Payer: Self-pay | Admitting: Internal Medicine

## 2015-02-03 VITALS — BP 140/88 | HR 95 | Wt 163.0 lb

## 2015-02-03 DIAGNOSIS — Z9119 Patient's noncompliance with other medical treatment and regimen: Secondary | ICD-10-CM | POA: Diagnosis not present

## 2015-02-03 DIAGNOSIS — E559 Vitamin D deficiency, unspecified: Secondary | ICD-10-CM

## 2015-02-03 DIAGNOSIS — E538 Deficiency of other specified B group vitamins: Secondary | ICD-10-CM

## 2015-02-03 DIAGNOSIS — I159 Secondary hypertension, unspecified: Secondary | ICD-10-CM | POA: Diagnosis not present

## 2015-02-03 DIAGNOSIS — M329 Systemic lupus erythematosus, unspecified: Secondary | ICD-10-CM

## 2015-02-03 DIAGNOSIS — Z91199 Patient's noncompliance with other medical treatment and regimen due to unspecified reason: Secondary | ICD-10-CM

## 2015-02-03 MED ORDER — PREDNISONE 10 MG PO TABS: ORAL_TABLET | ORAL | Status: DC

## 2015-02-03 MED ORDER — LOSARTAN POTASSIUM 100 MG PO TABS: 100.0000 mg | ORAL_TABLET | Freq: Every day | ORAL | Status: DC

## 2015-02-03 NOTE — Assessment & Plan Note (Signed)
Re-start B12 

## 2015-02-03 NOTE — Patient Instructions (Signed)
PEASE TAKE YOUR MEDS AS DIRECTED!!!!

## 2015-02-03 NOTE — Progress Notes (Signed)
Subjective:  Patient ID: Dan Adams, male    DOB: 05-27-1990  Age: 23 y.o. MRN: [id number]  CC: No chief complaint on file.   HPI Dan Adams presents for ER in [place] visit last Fi - he had jitters, dizziness, BP was 200/100. He was not taking his BP meds...  Outpatient Prescriptions Prior to Visit  Medication Sig Dispense Refill  . Cholecalciferol (VITAMIN D3) 2000 UNITS capsule Take 1 capsule (2,000 Units total) by mouth daily. 100 capsule 3  . Cyanocobalamin (VITAMIN B-12) 1000 MCG SUBL Place 1 tablet (1,000 mcg total) under the tongue daily. 100 tablet 3  . HYDROXYCHLOROQUINE SULFATE PO Take 200 mg by mouth every morning. 2 tabs in am = 400 mg    . triamcinolone ointment (KENALOG) 0.1 % Apply 1 application topically 2 (two) times daily. To lips qid 60 g 1  . acyclovir (ZOVIRAX) 400 MG tablet Take 1 tablet (400 mg total) by mouth 3 (three) times daily. 21 tablet 3  . amLODipine (NORVASC) 5 MG tablet Take 1 tablet (5 mg total) by mouth daily. 30 tablet 11  . predniSONE (DELTASONE) 10 MG tablet Prednisone 10 mg: take 4 tabs a day x 3 days; then 3 tabs a day x 4 days; then 2 tabs a day x 4 days, then 1 tab a day. Take pc. 120 tablet 1   No facility-administered medications prior to visit.    ROS Review of Systems  Constitutional: Negative for appetite change, fatigue and unexpected weight change.  HENT: Negative for congestion, nosebleeds, sneezing, sore throat and trouble swallowing.   Eyes: Negative for itching and visual disturbance.  Respiratory: Negative for cough.   Cardiovascular: Negative for chest pain, palpitations and leg swelling.  Gastrointestinal: Negative for nausea, diarrhea, blood in stool and abdominal distention.  Genitourinary: Negative for frequency and hematuria.  Musculoskeletal: Positive for arthralgias. Negative for back pain, joint swelling, gait problem and neck pain.  Skin: Positive for rash.  Neurological: Positive for light-headedness  and headaches. Negative for dizziness, tremors, speech difficulty and weakness.  Psychiatric/Behavioral: Negative for sleep disturbance, dysphoric mood and agitation. The patient is not nervous/anxious.     Objective:  BP 140/88 mmHg  Pulse 95  Wt 163 lb (73.936 kg)  SpO2 98%  BP Readings from Last 3 Encounters:  02/03/15 140/88  12/28/14 150/98  12/26/14 161/102    Wt Readings from Last 3 Encounters:  02/03/15 163 lb (73.936 kg)  12/28/14 166 lb (75.297 kg)  12/26/14 163 lb (73.936 kg)    Physical Exam  Constitutional: He is oriented to person, place, and time. He appears well-developed. No distress.  NAD  HENT:  Mouth/Throat: Oropharynx is clear and moist.  Eyes: Conjunctivae are normal. Pupils are equal, round, and reactive to light.  Neck: Normal range of motion. No JVD present. No thyromegaly present.  Cardiovascular: Normal rate, regular rhythm, normal heart sounds and intact distal pulses.  Exam reveals no gallop and no friction rub.   No murmur heard. Pulmonary/Chest: Effort normal and breath sounds normal. No respiratory distress. He has no wheezes. He has no rales. He exhibits no tenderness.  Abdominal: Soft. Bowel sounds are normal. He exhibits no distension and no mass. There is no tenderness. There is no rebound and no guarding.  Musculoskeletal: Normal range of motion. He exhibits no edema or tenderness.  Lymphadenopathy:    He has no cervical adenopathy.  Neurological: He is alert and oriented to person, place, and time. He has normal reflexes. No  cranial nerve deficit. He exhibits normal muscle tone. He displays a negative Romberg sign. Coordination and gait normal.  Skin: Skin is warm and dry. No rash noted.  Psychiatric: He has a normal mood and affect. His behavior is normal. Judgment and thought content normal.    Lab Results  Component Value Date   WBC 3.1* 12/26/2014   HGB 12.5* 12/26/2014   HCT 37.9* 12/26/2014   PLT 188 12/26/2014   GLUCOSE 104*  12/26/2014   ALT 21 08/13/2014   AST 20 08/13/2014   NA 137 12/26/2014   K 4.1 12/26/2014   CL 101 12/26/2014   CREATININE 0.90 12/26/2014   BUN 18 12/26/2014   CO2 26 12/26/2014   TSH 1.13 08/13/2014    No results found.  Assessment & Plan:   Diagnoses and all orders for this visit:  Vitamin B12 deficiency  Non compliance with medical treatment  Vitamin D deficiency  Hypertension, secondary  Lupus (systemic lupus erythematosus) (HCC)  Other orders -     predniSONE (DELTASONE) 10 MG tablet; 5 mg po qd pc -     losartan (COZAAR) 100 MG tablet; Take 1 tablet (100 mg total) by mouth daily. -     POCT glucose (manual entry)  I have discontinued Mr. Solinger's amLODipine and acyclovir. I have also changed his predniSONE and losartan. Additionally, I am having him maintain his HYDROXYCHLOROQUINE SULFATE PO, triamcinolone ointment, Vitamin B-12, and Vitamin D3.  Meds ordered this encounter  Medications  . DISCONTD: losartan (COZAAR) 100 MG tablet    Sig: Take 1 tablet by mouth daily.  . predniSONE (DELTASONE) 10 MG tablet    Sig: 5 mg po qd pc    Dispense:  120 tablet    Refill:  1  . losartan (COZAAR) 100 MG tablet    Sig: Take 1 tablet (100 mg total) by mouth daily.    Dispense:  30 tablet    Refill:  11     Follow-up: Return in about 2 months (around 04/05/2015) for a follow-up visit.  Sonda Primes, MD

## 2015-02-03 NOTE — Assessment & Plan Note (Signed)
Risks associated with treatment noncompliance were discussed again. Compliance was encouraged.  

## 2015-02-03 NOTE — Assessment & Plan Note (Signed)
Risks associated with treatment noncompliance were discussed again. Compliance was encouraged.

## 2015-02-03 NOTE — Assessment & Plan Note (Signed)
Pneumovax Pt declined a flu shot

## 2015-02-03 NOTE — Progress Notes (Signed)
Pre visit review using our clinic review tool, if applicable. No additional management support is needed unless otherwise documented below in the visit note. 

## 2015-02-03 NOTE — Assessment & Plan Note (Signed)
Amlodipine, Losartan - no side effects. Re-started Risks associated with treatment noncompliance were discussed. Compliance was encouraged.

## 2015-02-04 LAB — GLUCOSE, POCT (MANUAL RESULT ENTRY): POC GLUCOSE: 127 mg/dL — AB (ref 70–99)

## 2015-02-10 ENCOUNTER — Emergency Department (HOSPITAL_COMMUNITY): Payer: 59

## 2015-02-10 ENCOUNTER — Observation Stay (HOSPITAL_COMMUNITY): Payer: 59

## 2015-02-10 ENCOUNTER — Telehealth: Payer: Self-pay | Admitting: Internal Medicine

## 2015-02-10 ENCOUNTER — Encounter (HOSPITAL_BASED_OUTPATIENT_CLINIC_OR_DEPARTMENT_OTHER): Payer: Self-pay | Admitting: Emergency Medicine

## 2015-02-10 ENCOUNTER — Emergency Department (HOSPITAL_BASED_OUTPATIENT_CLINIC_OR_DEPARTMENT_OTHER): Payer: 59

## 2015-02-10 ENCOUNTER — Ambulatory Visit: Payer: Self-pay | Admitting: Internal Medicine

## 2015-02-10 ENCOUNTER — Observation Stay (HOSPITAL_BASED_OUTPATIENT_CLINIC_OR_DEPARTMENT_OTHER)
Admission: EM | Admit: 2015-02-10 | Discharge: 2015-02-12 | Disposition: A | Discharge status: Home / Self Care | Payer: 59 | Attending: Internal Medicine | Admitting: Internal Medicine

## 2015-02-10 DIAGNOSIS — I159 Secondary hypertension, unspecified: Secondary | ICD-10-CM | POA: Diagnosis not present

## 2015-02-10 DIAGNOSIS — R4781 Slurred speech: Secondary | ICD-10-CM | POA: Diagnosis present

## 2015-02-10 DIAGNOSIS — D72819 Decreased white blood cell count, unspecified: Secondary | ICD-10-CM | POA: Insufficient documentation

## 2015-02-10 DIAGNOSIS — G459 Transient cerebral ischemic attack, unspecified: Secondary | ICD-10-CM | POA: Diagnosis present

## 2015-02-10 DIAGNOSIS — I10 Essential (primary) hypertension: Secondary | ICD-10-CM | POA: Insufficient documentation

## 2015-02-10 DIAGNOSIS — D696 Thrombocytopenia, unspecified: Secondary | ICD-10-CM | POA: Insufficient documentation

## 2015-02-10 DIAGNOSIS — I639 Cerebral infarction, unspecified: Secondary | ICD-10-CM

## 2015-02-10 DIAGNOSIS — M069 Rheumatoid arthritis, unspecified: Secondary | ICD-10-CM | POA: Diagnosis not present

## 2015-02-10 DIAGNOSIS — M329 Systemic lupus erythematosus, unspecified: Secondary | ICD-10-CM | POA: Diagnosis not present

## 2015-02-10 DIAGNOSIS — R2990 Unspecified symptoms and signs involving the nervous system: Secondary | ICD-10-CM | POA: Diagnosis not present

## 2015-02-10 DIAGNOSIS — Z79899 Other long term (current) drug therapy: Secondary | ICD-10-CM | POA: Insufficient documentation

## 2015-02-10 DIAGNOSIS — G458 Other transient cerebral ischemic attacks and related syndromes: Secondary | ICD-10-CM | POA: Diagnosis not present

## 2015-02-10 DIAGNOSIS — Z87891 Personal history of nicotine dependence: Secondary | ICD-10-CM | POA: Insufficient documentation

## 2015-02-10 DIAGNOSIS — E785 Hyperlipidemia, unspecified: Secondary | ICD-10-CM | POA: Diagnosis not present

## 2015-02-10 DIAGNOSIS — G988 Other disorders of nervous system: Secondary | ICD-10-CM | POA: Diagnosis not present

## 2015-02-10 HISTORY — DX: Unspecified asthma, uncomplicated: J45.909

## 2015-02-10 HISTORY — DX: Other chronic pain: G89.29

## 2015-02-10 HISTORY — DX: Depression, unspecified: F32.A

## 2015-02-10 HISTORY — DX: Rheumatoid arthritis, unspecified: M06.9

## 2015-02-10 HISTORY — DX: Unspecified symptoms and signs involving the nervous system: R29.90

## 2015-02-10 HISTORY — DX: Systemic lupus erythematosus, unspecified: M32.9

## 2015-02-10 HISTORY — DX: Dorsalgia, unspecified: M54.9

## 2015-02-10 HISTORY — DX: Anxiety disorder, unspecified: F41.9

## 2015-02-10 HISTORY — DX: Major depressive disorder, single episode, unspecified: F32.9

## 2015-02-10 HISTORY — DX: Migraine, unspecified, not intractable, without status migrainosus: G43.909

## 2015-02-10 LAB — URINALYSIS, ROUTINE W REFLEX MICROSCOPIC
Bilirubin Urine: NEGATIVE
GLUCOSE, UA: NEGATIVE mg/dL
KETONES UR: NEGATIVE mg/dL
LEUKOCYTES UA: NEGATIVE
Nitrite: NEGATIVE
PH: 8 (ref 5.0–8.0)
Protein, ur: NEGATIVE mg/dL
Specific Gravity, Urine: 1.006 (ref 1.005–1.030)
Urobilinogen, UA: 0.2 mg/dL (ref 0.0–1.0)

## 2015-02-10 LAB — BASIC METABOLIC PANEL
Anion gap: 11 (ref 5–15)
BUN: 8 mg/dL (ref 6–20)
CO2: 26 mmol/L (ref 22–32)
Calcium: 9 mg/dL (ref 8.9–10.3)
Chloride: 101 mmol/L (ref 101–111)
Creatinine, Ser: 1.03 mg/dL (ref 0.61–1.24)
GLUCOSE: 87 mg/dL (ref 65–99)
POTASSIUM: 4 mmol/L (ref 3.5–5.1)
SODIUM: 138 mmol/L (ref 135–145)

## 2015-02-10 LAB — CBC WITH DIFFERENTIAL/PLATELET
BASOS ABS: 0 10*3/uL (ref 0.0–0.1)
BASOS PCT: 0 %
EOS ABS: 0 10*3/uL (ref 0.0–0.7)
Eosinophils Relative: 0 %
HEMATOCRIT: 34.8 % — AB (ref 39.0–52.0)
HEMOGLOBIN: 11.1 g/dL — AB (ref 13.0–17.0)
Lymphocytes Relative: 23 %
Lymphs Abs: 0.5 10*3/uL — ABNORMAL LOW (ref 0.7–4.0)
MCH: 27.7 pg (ref 26.0–34.0)
MCHC: 31.9 g/dL (ref 30.0–36.0)
MCV: 86.8 fL (ref 78.0–100.0)
Monocytes Absolute: 0.2 10*3/uL (ref 0.1–1.0)
Monocytes Relative: 8 %
NEUTROS ABS: 1.6 10*3/uL — AB (ref 1.7–7.7)
NEUTROS PCT: 69 %
Platelets: 181 10*3/uL (ref 150–400)
RBC: 4.01 MIL/uL — ABNORMAL LOW (ref 4.22–5.81)
RDW: 13.2 % (ref 11.5–15.5)
WBC: 2.4 10*3/uL — ABNORMAL LOW (ref 4.0–10.5)

## 2015-02-10 LAB — LIPID PANEL
CHOLESTEROL: 158 mg/dL (ref 0–200)
HDL: 25 mg/dL — ABNORMAL LOW (ref >40)
LDL Cholesterol: 103 mg/dL — ABNORMAL HIGH (ref 0–99)
Total CHOL/HDL Ratio: 6.3 RATIO
Triglycerides: 148 mg/dL (ref <150)
VLDL: 30 mg/dL (ref 0–40)

## 2015-02-10 LAB — URINE MICROSCOPIC-ADD ON

## 2015-02-10 MED ORDER — PREDNISONE 5 MG PO TABS: 5.0000 mg | ORAL_TABLET | Freq: Every day | ORAL | Status: DC

## 2015-02-10 MED ORDER — ACETAMINOPHEN 650 MG RE SUPP: 650.0000 mg | RECTAL | Status: DC | PRN

## 2015-02-10 MED ORDER — HYDROXYCHLOROQUINE SULFATE 200 MG PO TABS
400.0000 mg | ORAL_TABLET | Freq: Every morning | ORAL | Status: DC
Start: 2015-02-10 — End: 2015-02-12
  Administered 2015-02-10 – 2015-02-12 (×3): 400 mg via ORAL
  Filled 2015-02-10 (×3): qty 2

## 2015-02-10 MED ORDER — ENSURE ENLIVE PO LIQD
237.0000 mL | Freq: Two times a day (BID) | ORAL | Status: DC
  Administered 2015-02-11 – 2015-02-12 (×3): 237 mL via ORAL
  Filled 2015-02-10 (×6): qty 237

## 2015-02-10 MED ORDER — HYDROXYCHLOROQUINE SULFATE 200 MG PO TABS
200.0000 mg | ORAL_TABLET | Freq: Every morning | ORAL | Status: DC
Start: 2015-02-11 — End: 2015-02-10

## 2015-02-10 MED ORDER — VITAMIN D 1000 UNITS PO TABS
2000.0000 [IU] | ORAL_TABLET | Freq: Every day | ORAL | Status: DC
  Administered 2015-02-10 – 2015-02-12 (×3): 2000 [IU] via ORAL
  Filled 2015-02-10 (×2): qty 2

## 2015-02-10 MED ORDER — IOHEXOL 350 MG/ML SOLN
50.0000 mL | Freq: Once | INTRAVENOUS | Status: AC | PRN
  Administered 2015-02-10: 50 mL via INTRAVENOUS

## 2015-02-10 MED ORDER — ENOXAPARIN SODIUM 30 MG/0.3ML ~~LOC~~ SOLN
30.0000 mg | SUBCUTANEOUS | Status: DC
  Administered 2015-02-10: 30 mg via SUBCUTANEOUS
  Filled 2015-02-10: qty 0.3

## 2015-02-10 MED ORDER — SENNOSIDES-DOCUSATE SODIUM 8.6-50 MG PO TABS: 1.0000 | ORAL_TABLET | Freq: Every evening | ORAL | Status: DC | PRN

## 2015-02-10 MED ORDER — HYDROXYCHLOROQUINE SULFATE 200 MG PO TABS: 400.0000 mg | ORAL_TABLET | Freq: Every morning | ORAL | Status: DC

## 2015-02-10 MED ORDER — STROKE: EARLY STAGES OF RECOVERY BOOK
Freq: Once | Status: AC
  Administered 2015-02-11: 03:00:00
  Filled 2015-02-10: qty 1

## 2015-02-10 MED ORDER — PREDNISONE 5 MG PO TABS
5.0000 mg | ORAL_TABLET | Freq: Every day | ORAL | Status: DC
Start: 2015-02-10 — End: 2015-02-12
  Administered 2015-02-10 – 2015-02-12 (×3): 5 mg via ORAL
  Filled 2015-02-10 (×3): qty 1

## 2015-02-10 MED ORDER — VITAMIN B-12 1000 MCG PO TABS
1000.0000 ug | ORAL_TABLET | Freq: Every day | ORAL | Status: DC
  Administered 2015-02-10 – 2015-02-12 (×3): 1000 ug via ORAL
  Filled 2015-02-10 (×2): qty 1

## 2015-02-10 MED ORDER — VITAMIN B-12 1000 MCG SL SUBL: 1.0000 | SUBLINGUAL_TABLET | Freq: Every day | SUBLINGUAL | Status: DC

## 2015-02-10 MED ORDER — ASPIRIN 300 MG RE SUPP: 300.0000 mg | Freq: Every day | RECTAL | Status: DC

## 2015-02-10 MED ORDER — ACETAMINOPHEN 325 MG PO TABS: 650.0000 mg | ORAL_TABLET | ORAL | Status: DC | PRN

## 2015-02-10 MED ORDER — ASPIRIN 325 MG PO TABS
325.0000 mg | ORAL_TABLET | Freq: Every day | ORAL | Status: DC
  Administered 2015-02-10: 325 mg via ORAL

## 2015-02-10 NOTE — Consult Note (Signed)
Neurology Consultation Reason for Consult: Numbness Referring Physician: Colin Rhein, M.  CC: Numbness  History is obtained from: Patient  HPI: Dan Adams is a 24 y.o. male with a history of lupus, hypertension who presents with 3 episodes over the past 24 hours of numbness/weakness. Yesterday he had right arm and leg and face numbness that lasted for approximately 20 minutes and he went to [facility] and was discharged home. Today, he has had 2 episodes concerning. He initially had difficulty holding things with his left hand and slight slurred speech that lasted for approximately an hour. This resolved, however then later he had recurrence of symptoms lasting for approximately 20 minutes but there are more severe. He had slurred speech and weakness/numbness of his arm and leg.  He has had worsening headaches over the past 1.5 weeks. He does have a history of headaches, but the ones over the past 1.5 weeks have been increasing in severity. He does not have any headache today, and did not have a headache with these events.   LKW: Yesterday tpa given?: no, resolve symptoms  ROS: A 14 point ROS was performed and is negative except as noted in the HPI.   Past Medical History  Diagnosis Date  . Leukopenia 09/07/2013  . Thrombocytopenia, unspecified ([place]) 09/07/2013  . Lupus ([place])   . Hypertension      Family history: No history of clotting disorders   Social History:  reports that he has quit smoking. His smoking use included Cigars and Cigarettes. He started smoking about 4 years ago. He has a 6 pack-year smoking history. He has never used smokeless tobacco. He reports that he does not drink alcohol or use illicit drugs.   Exam: Current vital signs: BP 159/98 mmHg  Pulse 84  Temp(Src) 98.3 F (36.8 C) (Oral)  Resp 18  Ht 5' 5"  (1.651 m)  Wt 73.936 kg (163 lb)  BMI 27.12 kg/m2  SpO2 99% Vital signs in last 24 hours: Temp:  [98.3 F (36.8 C)] 98.3 F (36.8 C) (11/03  1011) Pulse Rate:  [80-99] 84 (11/03 1445) Resp:  [16-18] 18 (11/03 1051) BP: (148-166)/(98-104) 159/98 mmHg (11/03 1445) SpO2:  [98 %-100 %] 99 % (11/03 1445) Weight:  [73.936 kg (163 lb)] 73.936 kg (163 lb) (11/03 1011)   Physical Exam  Constitutional: Appears well-developed and well-nourished.  Psych: Affect appropriate to situation Eyes: No scleral injection HENT: No OP obstrucion Head: Normocephalic.  Cardiovascular: Normal rate and regular rhythm.  Respiratory: Effort normal and breath sounds normal to anterior ascultation GI: Soft.  No distension. There is no tenderness.  Skin: Malar rash  Neuro: Mental Status: Patient is awake, alert, oriented to person, place, month, year, and situation. Patient is able to give a clear and coherent history. No signs of aphasia or neglect Cranial Nerves: II: Visual Fields are full. Pupils are equal, round, and reactive to light.   III,IV, VI: EOMI without ptosis or diploplia.  V: Facial sensation is symmetric to temperature VII: Facial movement is symmetric.  VIII: hearing is intact to voice X: Uvula elevates symmetrically XI: Shoulder shrug is symmetric. XII: tongue is midline without atrophy or fasciculations.  Motor: Tone is normal. Bulk is normal. 5/5 strength was present in all four extremities.  Sensory: Sensation is symmetric to light touch and temperature in the arms and legs. Deep Tendon Reflexes: 2+ and symmetric in the biceps and patellae.  Plantars: Toes are downgoing bilaterally.  Cerebellar: FNF intact bilaterally    I have reviewed  labs in epic and the results pertinent to this consultation are: ESR from 9/18 - 58   I have reviewed the images obtained: MRI brain-negative  Impression: 24 year old male now with 3 episodes of transient neurological symptoms localizable to the brain. Given his history of lupus, I do think that more workup needs to be done to evaluate for possible hypercoagulable states or  potential sources for emboli. Also possible would be CNS vasculitis related to his lupus. Finally, the possibility of complex migraine would be in consideration but especially with his lack of headache today after having 2 episodes I think that other possibilities need to be considered further workup is needed.  Of note a CTA may be slightly more sensitive than MRA looking for median vessel vasculitis therefore I would prefer this to MRA.  Recommendations: 1. HgbA1c, fasting lipid panel 2. CT angiogram head and neck if his creatinine is within normal limits  3. Frequent neuro checks 4. Echocardiogram 5. Carotid dopplers would not be needed if the CTA is done  6. Prophylactic therapy-Antiplatelet med: Aspirin - dose 367m PO or 3077mPR 7. Risk factor modification 8. Telemetry monitoring 9. I will check anti-cardiolipin antibodies   McRoland RackMD [facility] [phone] 7pm- 7am, please page neurology on call as [place]

## 2015-02-10 NOTE — ED Notes (Signed)
Patient going to [facility] ED by POV

## 2015-02-10 NOTE — Telephone Encounter (Signed)
Patient Name: Dan Adams DOB: [birth year] Initial Comment Caller states son needs to be seen; left thigh was numb; could not talk at first; BP is good; Nurse Assessment Nurse: Vickey Sages, RN, [place] Date/Time (Eastern Time): 02/10/2015 9:34:40 AM Confirm and document reason for call. If symptomatic, describe symptoms. ---Caller states son needs to be seen; left thigh was numb; could not talk at first; BP is good; - SYMPTOMS RESOLVED NOW Has the patient traveled out of the country within the last 30 days? ---Not Applicable Does the patient have any new or worsening symptoms? ---Yes Will a triage be completed? ---Yes Related visit to physician within the last 2 weeks? ---No Does the PT have any chronic conditions? (i.e. diabetes, asthma, etc.) ---Yes List chronic conditions. ---Lupus Guidelines Guideline Title Affirmed Question Affirmed Notes Neurologic Deficit [1] Weakness (i.e., paralysis, loss of muscle strength) of the face, arm / hand, or leg / foot on one side of the body AND [2] sudden onset AND [3] brief (now gone) Final Disposition User Go to ED Now (or PCP triage) Vickey Sages, RN, [place] Referrals REFERRED TO PCP OFFICE Disagree/Comply: Comply

## 2015-02-10 NOTE — ED Provider Notes (Signed)
CSN: [id number]     Arrival date & time 02/10/15  1001 History   First MD Initiated Contact with Patient 02/10/15 1045     Chief Complaint  Patient presents with  . Numbness  . Dysphagia     (Consider location/radiation/quality/duration/timing/severity/associated sxs/prior Treatment) HPI Patient reported he had tingling of the entire right side of his body, from head to toe onset yesterday 4:30 PM. He was seen at an urgent care center, subsequently transferred to [facility] where he had an evaluation in the emergency department, was treated and released. This morning he awakened with blurred vision in his left eye which lasted for 1 hour and resolved spontaneously. At 8:30 AM he had slurred speech which lasted for 1 hour. Speech has resolved and patient is presently asymptomatic. No other associated symptoms. No treatment prior to coming here. Patient did not take his blood pressure medication (losartan ) this morning  Past Medical History  Diagnosis Date  . Leukopenia 09/07/2013  . Thrombocytopenia, unspecified (HCC) 09/07/2013  . Lupus (HCC)   . Hypertension    Past Surgical History  Procedure Laterality Date  . Tonsillectomy and adenoidectomy  2007   No family history on file. Social History  Substance Use Topics  . Smoking status: Former Smoker -- 2.00 packs/day for 3 years    Types: Cigars, Cigarettes    Start date: 04/09/2010  . Smokeless tobacco: Never Used     Comment: smokes 2 cigars daily  . Alcohol Use: No    Review of Systems  Eyes: Positive for visual disturbance.  Skin: Positive for rash.       Rash from lupus  Allergic/Immunologic: Positive for immunocompromised state.       Chronically on prednisone  Neurological: Positive for speech difficulty and numbness.  All other systems reviewed and are negative.    Results for orders placed or performed in visit on 02/03/15  POCT glucose (manual entry)  Result Value Ref Range   POC Glucose 127 (A) 70 - 99  mg/dl   Ct Head Wo Contrast  02/10/2015  CLINICAL DATA:  Weakness and numbness on left side which began this morning. History of lupus EXAM: CT HEAD WITHOUT CONTRAST TECHNIQUE: Contiguous axial images were obtained from the base of the skull through the vertex without intravenous contrast. COMPARISON:  None. FINDINGS: No acute cortical infarct, hemorrhage, or mass lesion ispresent. Ventricles are of normal size. No significant extra-axial fluid collection is present. The paranasal sinuses andmastoid air cells are clear. The osseous skull is intact. IMPRESSION: Negative exam. Electronically Signed   By: Signa Kell M.D.   On: 02/10/2015 10:34    Allergies  Codeine and Promethazine  Home Medications   Prior to Admission medications   Medication Sig Start Date End Date Taking? Authorizing Provider  Cholecalciferol (VITAMIN D3) 2000 UNITS capsule Take 1 capsule (2,000 Units total) by mouth daily. 12/28/14   Aleksei Plotnikov V, MD  Cyanocobalamin (VITAMIN B-12) 1000 MCG SUBL Place 1 tablet (1,000 mcg total) under the tongue daily. 12/28/14   Aleksei Plotnikov V, MD  HYDROXYCHLOROQUINE SULFATE PO Take 200 mg by mouth every morning. 2 tabs in am = 400 mg    Historical Provider, MD  losartan (COZAAR) 100 MG tablet Take 1 tablet (100 mg total) by mouth daily. 02/03/15   Aleksei Plotnikov V, MD  predniSONE (DELTASONE) 10 MG tablet 5 mg po qd pc 02/03/15   Aleksei Plotnikov V, MD   BP 161/102 mmHg  Pulse 99  Temp(Src) 98.3  F (36.8 C) (Oral)  Resp 16  Ht 5\' 5"  (1.651 m)  Wt 163 lb (73.936 kg)  BMI 27.12 kg/m2  SpO2 100% Physical Exam  Constitutional: He is oriented to person, place, and time. He appears well-developed and well-nourished.  HENT:  Head: Normocephalic and atraumatic.  Eyes: Conjunctivae are normal. Pupils are equal, round, and reactive to light.  Neck: Neck supple. No tracheal deviation present. No thyromegaly present.  Cardiovascular: Normal rate and regular rhythm.   No  murmur heard. Pulmonary/Chest: Effort normal and breath sounds normal.  Abdominal: Soft. Bowel sounds are normal. He exhibits no distension. There is no tenderness.  Musculoskeletal: Normal range of motion. He exhibits no edema or tenderness.  Neurological: He is alert and oriented to person, place, and time. No cranial nerve deficit. Coordination normal.  Gait normal Romberg normal pronator drift normal finger to nose normal motor strength 5 over 5 overall. DTRs symmetric bilaterally at knee jerk and ankle jerk and biceps toes downward going bilaterally  Skin: Skin is warm and dry. Rash noted.  Butterfly rash on face typical of lupus  Psychiatric: He has a normal mood and affect.  Nursing note and vitals reviewed.   ED Course  Procedures (including critical care time) Labs Review Labs Reviewed - No data to display  Imaging Review Ct Head Wo Contrast  02/10/2015  CLINICAL DATA:  Weakness and numbness on left side which began this morning. History of lupus EXAM: CT HEAD WITHOUT CONTRAST TECHNIQUE: Contiguous axial images were obtained from the base of the skull through the vertex without intravenous contrast. COMPARISON:  None. FINDINGS: No acute cortical infarct, hemorrhage, or mass lesion ispresent. Ventricles are of normal size. No significant extra-axial fluid collection is present. The paranasal sinuses andmastoid air cells are clear. The osseous skull is intact. IMPRESSION: Negative exam. Electronically Signed   By: 13/06/2014 M.D.   On: 02/10/2015 10:34   I have personally reviewed and evaluated these images and lab results as part of my medical decision-making.   EKG Interpretation None      labwork form yesterday at [facility] reviewed MDM  Patient  Should be evaluated with MRI of brain. Consider neurology consult I spoken with Dr[facility]anour [facility], emergency department who will accept patient in transfer. Diagnosis neurologic disorder, not otherwise specified Final diagnoses:   None        [facility], MD 02/10/15 1121

## 2015-02-10 NOTE — H&P (Signed)
[facility] History and Physical  Dan Adams DHR:[id number] DOB: 1990/08/15 DOA: 02/10/2015  Referring physician: Mirian Mo PCP: Sonda Primes, MD   Chief Complaint: intermittent numbness/tingling  HPI: Dan Adams is a very pleasant 24 y.o. male with a past medical history that includes lupus, hypertension, rheumatoid arthritis, thrombocytopenia, Leukopenia presents with the chief complaint of 3 episodes over the past 24 hours of numbness/weakness. Was evaluated by neurology in the emergency department who recommended admission for TIA workup.  Information is obtained from the patient and his mother who is at the bedside. He reports that yesterday he had right arm and leg and right sided face numbness with some tingling. He reports that lasted about 20 minutes. He went to urgent care Center and it was discovered at that time that his blood pressure was uncontrolled so he was transported to [facility]. He reports they got his blood pressure under control and he was discharged to home. His mother reports this morning he awakened and he had slurred speech with left-handed weakness and this lasted about an hour. Associated symptoms include intermittent headache that is worse than his usual headaches with intermittent blurred vision in his left eye. He denies and unsteady gait or any falling. He denies difficulty swallowing. He denies chest pain or palpitation shortness of breath. He denies any recent fever chills or sick contacts.  Workup in the emergency department reveals basic metabolic panel that is unremarkable. Complete blood count significant for WBCs of 2.4 hemoglobin 11.1. Urinalysis unremarkable. ET of the head unremarkable, RI the brain unremarkable. EKG was sinus rhythm.  In the emergency department he is afebrile somewhat hypertensive and hemodynamically stable not hypoxic  Review of Systems:  10 point review of systems complete and all systems are negative except  as indicated in the history of present illness  Past Medical History  Diagnosis Date  . Leukopenia 09/07/2013  . Thrombocytopenia, unspecified (HCC) 09/07/2013  . Lupus (HCC)   . Hypertension   . RA (rheumatoid arthritis) [facility])    Past Surgical History  Procedure Laterality Date  . Tonsillectomy and adenoidectomy  2007   Social History:  reports that he has quit smoking. His smoking use included Cigars and Cigarettes. He started smoking about 4 years ago. He has a 6 pack-year smoking history. He has never used smokeless tobacco. He reports that he does not drink alcohol or use illicit drugs. Lives at home with his mother he is currently unemployed and not in school he is independent with ADLs Allergies  Allergen Reactions  . Codeine Nausea And Vomiting    Pt thinks he is allergic to codeine--  . Promethazine     No family history on file. mother is alive and in bedside. Remarkable past medical history. No siblings  Prior to Admission medications   Medication Sig Start Date End Date Taking? Authorizing Provider  Cholecalciferol (VITAMIN D3) 2000 UNITS capsule Take 1 capsule (2,000 Units total) by mouth daily. 12/28/14  Yes Aleksei Plotnikov V, MD  Cyanocobalamin (VITAMIN B-12) 1000 MCG SUBL Place 1 tablet (1,000 mcg total) under the tongue daily. 12/28/14  Yes Aleksei Plotnikov V, MD  HYDROXYCHLOROQUINE SULFATE PO Take 200 mg by mouth every morning. 2 tabs in am = 400 mg   Yes Historical Provider, MD  losartan (COZAAR) 100 MG tablet Take 1 tablet (100 mg total) by mouth daily. 02/03/15  Yes Aleksei Plotnikov V, MD  predniSONE (DELTASONE) 10 MG tablet 5 mg po qd pc 02/03/15  Yes Aleksei Plotnikov V,  MD   Physical Exam: Filed Vitals:   02/10/15 1011 02/10/15 1051 02/10/15 1430 02/10/15 1445  BP: 161/102 148/104 166/98 159/98  Pulse: 99 80 84 84  Temp: 98.3 F (36.8 C)     TempSrc: Oral     Resp: 16 18    Height: 5\' 5"  (1.651 m)     Weight: 73.936 kg (163 lb)     SpO2: 100% 100% 98%  99%    Wt Readings from Last 3 Encounters:  02/10/15 73.936 kg (163 lb)  02/03/15 73.936 kg (163 lb)  12/28/14 75.297 kg (166 lb)    General:  Appears calm and comfortable  Eyes: PERRL, normal lids, irises & conjunctiva ENT: grossly normal hearing, lips & tongue Neck: no LAD, masses or thyromegaly Cardiovascular: RRR, no m/r/g. Trace LE edema.  Respiratory: CTA bilaterally, no w/r/r. Normal respiratory effort. Question slightly blue nailbeds Abdomen: soft, ntnd Ozzie bowel sounds no guarding or rebounding Skin: Somewhat flushed appearance cheek and nose for head Musculoskeletal: grossly normal tone BUE/BLE Psychiatric: grossly normal mood and affect, speech fluent and appropriate Neurologic: grossly non-focal. Speech clear facial symmetry cranial nerves II through XII grossly intact           Labs on Admission:  Basic Metabolic Panel: No results for input(s): NA, K, CL, CO2, GLUCOSE, BUN, CREATININE, CALCIUM, MG, PHOS in the last 168 hours. Liver Function Tests: No results for input(s): AST, ALT, ALKPHOS, BILITOT, PROT, ALBUMIN in the last 168 hours. No results for input(s): LIPASE, AMYLASE in the last 168 hours. No results for input(s): AMMONIA in the last 168 hours. CBC:  Recent Labs Lab 02/10/15 1635  WBC 2.4*  NEUTROABS 1.6*  HGB 11.1*  HCT 34.8*  MCV 86.8  PLT 181   Cardiac Enzymes: No results for input(s): CKTOTAL, CKMB, CKMBINDEX, TROPONINI in the last 168 hours.  BNP (last 3 results) No results for input(s): BNP in the last 8760 hours.  ProBNP (last 3 results) No results for input(s): PROBNP in the last 8760 hours.  CBG: No results for input(s): GLUCAP in the last 168 hours.  Radiological Exams on Admission: Ct Head Wo Contrast  02/10/2015  CLINICAL DATA:  Weakness and numbness on left side which began this morning. History of lupus EXAM: CT HEAD WITHOUT CONTRAST TECHNIQUE: Contiguous axial images were obtained from the base of the skull through the  vertex without intravenous contrast. COMPARISON:  None. FINDINGS: No acute cortical infarct, hemorrhage, or mass lesion ispresent. Ventricles are of normal size. No significant extra-axial fluid collection is present. The paranasal sinuses andmastoid air cells are clear. The osseous skull is intact. IMPRESSION: Negative exam. Electronically Signed   By: 13/06/2014 M.D.   On: 02/10/2015 10:34   Mr Brain Wo Contrast  02/10/2015  CLINICAL DATA:  24 year old male with acute onset of right side body tingling, paresthesia yesterday at 1630 hours. Treated and released at [facility]. Today new onset blurred vision in the left eye, slurred speech. Initial encounter. EXAM: MRI HEAD WITHOUT CONTRAST TECHNIQUE: Multiplanar, multiecho pulse sequences of the brain and surrounding structures were obtained without intravenous contrast. COMPARISON:  Head CT without contrast 1032 hours today. FINDINGS: Cerebral volume is normal. No restricted diffusion to suggest acute infarction. No midline shift, mass effect, evidence of mass lesion, ventriculomegaly, extra-axial collection or acute intracranial hemorrhage. Cervicomedullary junction and pituitary are within normal limits. Major intracranial vascular flow voids are within normal limits. [facility] and white matter signal is within normal limits throughout the brain.  No chronic cerebral blood products. Visible internal auditory structures appear normal. Mastoids are clear. Paranasal sinuses are clear. Orbit and scalp soft tissues appear normal. Negative visualized cervical spine. Visualized bone marrow signal is within normal limits. IMPRESSION: Normal noncontrast MRI appearance of the brain. Electronically Signed   By: Odessa Fleming M.D.   On: 02/10/2015 13:51    EKG: Independently reviewed as noted above  Assessment/Plan Principal Problem:   Stroke-like symptoms Active Problems:   Leukopenia   Lupus (systemic lupus erythematosus) (HCC)   Hypertension, secondary    TIA (transient ischemic attack)  #1. Strokelike symptoms in patient with a history of lupus and hypertension. Symptoms resolved at the time of my exam. He was evaluated by neurology in the emergency department who opined given his lupus further workup required including possible hypercoagulable state or potential sources of emboli. He also opines it's possible could be CNS vasculitis related to his lupus or less likely complex migraine. Neuro recommended admission for TIA workup. CT and MR as noted above will obtain an CT angio of the head and neck per neuro request, carotid Dopplers, 2-D echo, lipid panel and hemoglobin A1c. Will provide aspirin and obtain frequent neuro checks. Appreciate Dr. Alene Mires assistant. Anti-Cardiolite been antibodies pending.  #2 hypertension. Blood pressure on the higher end of normal. Home indications include losartan. I will hold this for now monitor closely  #3. Lupus. Home medications include hydroxychloroquine as well as prednisone. will continue.  Code Status: full DVT Prophylaxis: Family Communication: mother at bedside Disposition Plan: home when ready hopefully 24 hours  Time spent: 55 minutes  [facility] M [facility]

## 2015-02-10 NOTE — Progress Notes (Signed)
Report received from ED at 1730 and pt arrived to the unit via stretcher with belongings and family to the side at 1810. Pt A&O x4; VSS; telemetry applied and verified; pt oriented to the unit and room; pt educated on fall precaution and prevention; pt agrees to call when assistance needed. Neuro check wnl; skin dry and intact; no wound or pressure ulcer noted. Pt in bed comfortably with call light within reach and family at side. Reported off to oncoming RN. PLeotis Pain Lashona Schaaf Rn

## 2015-02-10 NOTE — ED Provider Notes (Signed)
Pt transferred POV from Med center.  Seen yesterday at urgent care for tingling and sent to [facility] with HTN.    MRI here unremarkable.  Pt with tingling of LUE and LLE with ? LLE weakness.  Consulted neurology who obtained additional history concerning for TIA.  Admitted in stable condition  Mirian Mo, MD 02/11/15 [phone]

## 2015-02-10 NOTE — ED Notes (Signed)
Pt given urinal for for specimen

## 2015-02-10 NOTE — ED Notes (Signed)
IV team at bedside 

## 2015-02-10 NOTE — ED Notes (Addendum)
Pt having slurred speech, blurry vision, difficulty swallowing, and left sided numbness since this 0830 this am.  Last known normal 1030 pm last night.  Pt has lupus.  Grips equal.  No facial droop.  Decreased feeling to face arm and legs.

## 2015-02-10 NOTE — ED Notes (Signed)
Patient transported to CT 

## 2015-02-10 NOTE — ED Notes (Signed)
Pt and his mother updated on his ct results, mom states he was seen at [facility] last night with "a ton of blood work that was all ok".

## 2015-02-11 ENCOUNTER — Observation Stay (HOSPITAL_BASED_OUTPATIENT_CLINIC_OR_DEPARTMENT_OTHER): Payer: 59

## 2015-02-11 DIAGNOSIS — R2990 Unspecified symptoms and signs involving the nervous system: Secondary | ICD-10-CM | POA: Diagnosis not present

## 2015-02-11 DIAGNOSIS — G459 Transient cerebral ischemic attack, unspecified: Secondary | ICD-10-CM | POA: Diagnosis not present

## 2015-02-11 LAB — HEMOGLOBIN A1C
HEMOGLOBIN A1C: 5.2 % (ref 4.8–5.6)
MEAN PLASMA GLUCOSE: 103 mg/dL

## 2015-02-11 LAB — VITAMIN B12: VITAMIN B 12: 210 pg/mL (ref 180–914)

## 2015-02-11 MED ORDER — ASPIRIN EC 81 MG PO TBEC
81.0000 mg | DELAYED_RELEASE_TABLET | Freq: Every day | ORAL | Status: DC
  Administered 2015-02-11 – 2015-02-12 (×2): 81 mg via ORAL
  Filled 2015-02-11 (×2): qty 1

## 2015-02-11 MED ORDER — LOSARTAN POTASSIUM 50 MG PO TABS
100.0000 mg | ORAL_TABLET | Freq: Every day | ORAL | Status: DC
  Administered 2015-02-11 – 2015-02-12 (×2): 100 mg via ORAL
  Filled 2015-02-11 (×2): qty 2

## 2015-02-11 MED ORDER — ENOXAPARIN SODIUM 40 MG/0.4ML ~~LOC~~ SOLN
40.0000 mg | SUBCUTANEOUS | Status: DC
  Administered 2015-02-11: 40 mg via SUBCUTANEOUS
  Filled 2015-02-11: qty 0.4

## 2015-02-11 MED ORDER — ATORVASTATIN CALCIUM 40 MG PO TABS
40.0000 mg | ORAL_TABLET | Freq: Every day | ORAL | Status: DC
  Administered 2015-02-11: 40 mg via ORAL
  Filled 2015-02-11: qty 1

## 2015-02-11 NOTE — Evaluation (Signed)
Occupational Therapy Evaluation Patient Details Name: Dan Adams MRN: [id number] DOB: [birth year] Today's Date: 02/11/2015    History of Present Illness Adm after several days of intermittent numbness and weakness of extremities (at times Rt leg and arm, then Lt); MRI negative  PMHx- migraines, lupus, recent loss of job, home, girlfriend   Clinical Impression   Pt reports sharp pain in B hands with extension of elbows, function is fully intact when pain subsides. Pt is performing ADL and ADL transfers independently. No further OT needs.    Follow Up Recommendations  No OT follow up    Equipment Recommendations  None recommended by OT    Recommendations for Other Services       Precautions / Restrictions Precautions Precautions: None      Mobility Bed Mobility Overal bed mobility: Independent                Transfers Overall transfer level: Independent Equipment used: None                  Balance                                            ADL Overall ADL's : Independent                                       General ADL Comments: No difficulty using B hands for fasteners, utensils, opening containers.     Vision     Perception     Praxis      Pertinent Vitals/Pain Pain Assessment: Faces Faces Pain Scale: Hurts a little bit Pain Location: feet--chronic Pain Descriptors / Indicators: Aching Pain Intervention(s): Limited activity within patient's tolerance;Monitored during session;Repositioned     Hand Dominance Right   Extremity/Trunk Assessment Upper Extremity Assessment Upper Extremity Assessment: Overall WFL for tasks assessed (reports sharp pain when extending elbow B)   Lower Extremity Assessment Lower Extremity Assessment: Defer to PT evaluation   Cervical / Trunk Assessment Cervical / Trunk Assessment: Normal   Communication Communication Communication: No difficulties   Cognition  Arousal/Alertness: Awake/alert Behavior During Therapy: WFL for tasks assessed/performed Overall Cognitive Status: Within Functional Limits for tasks assessed                     General Comments       Exercises       Shoulder Instructions      Home Living Family/patient expects to be discharged to:: Private residence Living Arrangements: Parent;Non-relatives/Friends;Other relatives                           Home Equipment: None          Prior Functioning/Environment Level of Independence: Independent             OT Diagnosis: Acute pain   OT Problem List:     OT Treatment/Interventions:      OT Goals(Current goals can be found in the care plan section)    OT Frequency:     Barriers to D/C:            Co-evaluation              End of Session    Activity Tolerance: Patient  tolerated treatment well Patient left: in bed;with call bell/phone within reach;with family/visitor present   Time: 1435-1451 OT Time Calculation (min): 16 min Charges:  OT General Charges $OT Visit: 1 Procedure OT Evaluation $Initial OT Evaluation Tier I: 1 Procedure G-Codes: OT G-codes **NOT FOR INPATIENT CLASS** Functional Assessment Tool Used: clinical judgement Functional Limitation: Self care Self Care Current Status (A1287): 0 percent impaired, limited or restricted Self Care Discharge Status (O6767): 0 percent impaired, limited or restricted  Evern Bio 02/11/2015, 2:52 PM  [phone]

## 2015-02-11 NOTE — Progress Notes (Signed)
Patient Demographics:    Dan Adams, is a 24 y.o. male, DOB - 12/26/1990, IRC:[id number]  Admit date - 02/10/2015   Admitting Physician Ozella Rocks, MD  Outpatient Primary MD for the patient is Sonda Primes, MD  LOS -    Chief Complaint  Patient presents with  . Numbness  . Dysphagia        Subjective:    Dan Adams today has, No headache, No chest pain, No abdominal pain - No Nausea, No new weakness tingling or numbness, No Cough - SOB.    Assessment  & Plan :     1. Bilateral neurological symptoms suspicious for embolic TIA. Neurology on board, symptoms currently resolved, so far MRI brain, CT angiogram of the head and neck nonacute. Echogram pending. PT, OT and speech evaluation pending. On aspirin for secondary prevention continue. Will place on statin.  Lab Results  Component Value Date   HGBA1C 5.2 02/10/2015    Lab Results  Component Value Date   CHOL 158 02/10/2015   HDL 25* 02/10/2015   LDLCALC 103* 02/10/2015   TRIG 148 02/10/2015   CHOLHDL 6.3 02/10/2015     2. History of lupus. Continue home medications unchanged. Includes Actonel along with prednisone. Outpatient ophthalmology follow-up.   3. Dyslipidemia. Placed on statin.   4. Central hypertension. Resume home ARB.   5. History of low B-12. On supplementation continue. Recheck B-12 level.    Code Status : Full  Family Communication  : Family bedside  Disposition Plan  : Home 1-2 days  Consults  :  Neurology  Procedures  :   CT, CTA head and neck, MRI brain. All nonacute  Echogram  DVT Prophylaxis  :  Lovenox   Lab Results  Component Value Date   PLT 181 02/10/2015    Inpatient Medications  Scheduled Meds: . aspirin EC  81 mg Oral Daily  . cholecalciferol  2,000 Units Oral  Daily  . enoxaparin (LOVENOX) injection  40 mg Subcutaneous Q24H  . feeding supplement (ENSURE ENLIVE)  237 mL Oral BID BM  . hydroxychloroquine  400 mg Oral q morning - 10a  . predniSONE  5 mg Oral Q breakfast  . vitamin B-12  1,000 mcg Oral Daily   Continuous Infusions:  PRN Meds:.acetaminophen **OR** acetaminophen, senna-docusate  Antibiotics  :     Anti-infectives    Start     Dose/Rate Route Frequency Ordered Stop   02/11/15 1000  hydroxychloroquine (PLAQUENIL) tablet 200 mg  Status:  Discontinued     200 mg Oral  Every morning - 10a 02/10/15 1818 02/10/15 1823   02/11/15 1000  hydroxychloroquine (PLAQUENIL) tablet 400 mg  Status:  Discontinued     400 mg Oral  Every morning - 10a 02/10/15 1823 02/10/15 2025   02/10/15 2100  hydroxychloroquine (PLAQUENIL) tablet 400 mg     400 mg Oral  Every morning - 10a 02/10/15 2025          Objective:   Filed Vitals:   02/11/15 0200 02/11/15 0400 02/11/15 0600 02/11/15 1000  BP: 158/101 134/89 152/95 157/86  Pulse: 78 74 81 96  Temp:   98.6 F (37 C) 98.9 F (37.2 C)  TempSrc:   Oral Oral  Resp:  18 16 18 18   Height:      Weight:      SpO2: 99% 99% 100% 100%    Wt Readings from Last 3 Encounters:  02/10/15 73.936 kg (163 lb)  02/03/15 73.936 kg (163 lb)  12/28/14 75.297 kg (166 lb)     Intake/Output Summary (Last 24 hours) at 02/11/15 1105 Last data filed at 02/11/15 0900  Gross per 24 hour  Intake    360 ml  Output    300 ml  Net     60 ml   Physical Exam  Awake Alert, Oriented X 3, No new F.N deficits, Normal affect [place].AT,PERRAL Supple Neck,No JVD, No cervical lymphadenopathy appriciated.  Symmetrical Chest wall movement, Good air movement bilaterally, CTAB RRR,No Gallops,Rubs or new Murmurs, No Parasternal Heave +ve B.Sounds, Abd Soft, No tenderness, No organomegaly appriciated, No rebound - guarding or rigidity. No Cyanosis, Clubbing or edema, No new Rash or bruise      Data Review:   Micro Results No  results found for this or any previous visit (from the past 240 hour(s)).  Radiology Reports Ct Angio Head W/cm &/or Wo Cm  02/10/2015  CLINICAL DATA:  Bilateral upper and lower extremity weakness, fever, and neck pain. Dizziness. EXAM: CT ANGIOGRAPHY HEAD AND NECK TECHNIQUE: Multidetector CT imaging of the head and neck was performed using the standard protocol during bolus administration of intravenous contrast. Multiplanar CT image reconstructions and MIPs were obtained to evaluate the vascular anatomy. Carotid stenosis measurements (when applicable) are obtained utilizing NASCET criteria, using the distal internal carotid diameter as the denominator. CONTRAST:  86mL OMNIPAQUE IOHEXOL 350 MG/ML SOLN COMPARISON:  MRI brain and CT head without contrast from the same day. FINDINGS: CT HEAD Brain: No acute infarct, hemorrhage, or mass lesion is present. The ventricles are of normal size. No significant extraaxial fluid collection is present. Calvarium and skull base: Within normal limits Paranasal sinuses: Clear Orbits: Negative CTA NECK Aortic arch: The left vertebral artery originates from the aortic arch. A 4 vessel arch is present. There is no significant atherosclerotic disease or stenosis. The upper mediastinum is unremarkable. Right carotid system: The right common carotid artery is within normal limits. The bifurcation is within normal limits. The cervical right ICA is normal. There is mild tortuosity of the high cervical right ICA just below the skullbase without significant stenosis. Left carotid system: The left common carotid artery is within normal limits. The bifurcation is unremarkable. More prominent tortuosity is noted in the distal cervical left ICA without significant stenosis or evidence for FMD. Vertebral arteries:The right vertebral artery originates from the subclavian artery. There is some tortuosity of the right vertebral artery proximally and also at C5-6 on the right. There is no focal  stenosis or evidence for vascular injury within either vertebral artery in the neck. Skeleton: Reversal of the normal cervical lordosis is likely positional. No focal lytic or blastic lesions are present. The mandible is intact. Other neck: No focal mucosal or submucosal lesions are present. The thyroid is within normal limits. Reactive type lymph nodes are evident bilaterally. The most prominent lymph nodes are some a DVA there nodes bilaterally and. The salivary glands are unremarkable. CTA HEAD Anterior circulation: The internal carotid arteries are within normal limits from the high cervical segments through the ICA termini. The A1 and M1 segments are normal. Anterior communicating artery is patent. The MCA bifurcations are intact. The ACA and MCA branch vessels are within normal limits. Posterior circulation: The PICA origins  are visualized and normal. The basilar artery is normal. A posterior cerebral arteries originate from the basilar tip. There is contribution from a left posterior communicating artery as well. The PCA branch vessels are within normal limits. Venous sinuses: The dural sinuses are patent. Straight sinus and deep cerebral veins are intact. Cortical veins are unremarkable. Anatomic variants: None Delayed phase: No pathologic enhancement is present. IMPRESSION: 1. Tortuosity of the distal cervical internal carotid arteries is advanced for age, left greater than right. There is no associated stenosis, vascular injury, or FMD. 2. Mild tortuosity in the vertebral arteries in the neck. 3. Normal CTA circle of Willis without evidence for significant proximal stenosis, aneurysm, or branch vessel occlusion. 4. Normal CT appearance of the brain. Electronically Signed   By: Marin Roberts M.D.   On: 02/10/2015 21:37   Dg Chest 2 View  02/10/2015  CLINICAL DATA:  Stroke symptoms for 1 day.  Hypertension. EXAM: CHEST  2 VIEW COMPARISON:  10/25/2014 FINDINGS: The heart size and mediastinal  contours are within normal limits. Both lungs are clear. The visualized skeletal structures are unremarkable. IMPRESSION: No active cardiopulmonary disease. Electronically Signed   By: Ellery Plunk M.D.   On: 02/10/2015 22:54   Ct Head Wo Contrast  02/10/2015  CLINICAL DATA:  Weakness and numbness on left side which began this morning. History of lupus EXAM: CT HEAD WITHOUT CONTRAST TECHNIQUE: Contiguous axial images were obtained from the base of the skull through the vertex without intravenous contrast. COMPARISON:  None. FINDINGS: No acute cortical infarct, hemorrhage, or mass lesion ispresent. Ventricles are of normal size. No significant extra-axial fluid collection is present. The paranasal sinuses andmastoid air cells are clear. The osseous skull is intact. IMPRESSION: Negative exam. Electronically Signed   By: Signa Kell M.D.   On: 02/10/2015 10:34   Ct Angio Neck W/cm &/or Wo/cm  02/10/2015  CLINICAL DATA:  Bilateral upper and lower extremity weakness, fever, and neck pain. Dizziness. EXAM: CT ANGIOGRAPHY HEAD AND NECK TECHNIQUE: Multidetector CT imaging of the head and neck was performed using the standard protocol during bolus administration of intravenous contrast. Multiplanar CT image reconstructions and MIPs were obtained to evaluate the vascular anatomy. Carotid stenosis measurements (when applicable) are obtained utilizing NASCET criteria, using the distal internal carotid diameter as the denominator. CONTRAST:  55mL OMNIPAQUE IOHEXOL 350 MG/ML SOLN COMPARISON:  MRI brain and CT head without contrast from the same day. FINDINGS: CT HEAD Brain: No acute infarct, hemorrhage, or mass lesion is present. The ventricles are of normal size. No significant extraaxial fluid collection is present. Calvarium and skull base: Within normal limits Paranasal sinuses: Clear Orbits: Negative CTA NECK Aortic arch: The left vertebral artery originates from the aortic arch. A 4 vessel arch is present.  There is no significant atherosclerotic disease or stenosis. The upper mediastinum is unremarkable. Right carotid system: The right common carotid artery is within normal limits. The bifurcation is within normal limits. The cervical right ICA is normal. There is mild tortuosity of the high cervical right ICA just below the skullbase without significant stenosis. Left carotid system: The left common carotid artery is within normal limits. The bifurcation is unremarkable. More prominent tortuosity is noted in the distal cervical left ICA without significant stenosis or evidence for FMD. Vertebral arteries:The right vertebral artery originates from the subclavian artery. There is some tortuosity of the right vertebral artery proximally and also at C5-6 on the right. There is no focal stenosis or evidence for vascular injury  within either vertebral artery in the neck. Skeleton: Reversal of the normal cervical lordosis is likely positional. No focal lytic or blastic lesions are present. The mandible is intact. Other neck: No focal mucosal or submucosal lesions are present. The thyroid is within normal limits. Reactive type lymph nodes are evident bilaterally. The most prominent lymph nodes are some a DVA there nodes bilaterally and. The salivary glands are unremarkable. CTA HEAD Anterior circulation: The internal carotid arteries are within normal limits from the high cervical segments through the ICA termini. The A1 and M1 segments are normal. Anterior communicating artery is patent. The MCA bifurcations are intact. The ACA and MCA branch vessels are within normal limits. Posterior circulation: The PICA origins are visualized and normal. The basilar artery is normal. A posterior cerebral arteries originate from the basilar tip. There is contribution from a left posterior communicating artery as well. The PCA branch vessels are within normal limits. Venous sinuses: The dural sinuses are patent. Straight sinus and deep  cerebral veins are intact. Cortical veins are unremarkable. Anatomic variants: None Delayed phase: No pathologic enhancement is present. IMPRESSION: 1. Tortuosity of the distal cervical internal carotid arteries is advanced for age, left greater than right. There is no associated stenosis, vascular injury, or FMD. 2. Mild tortuosity in the vertebral arteries in the neck. 3. Normal CTA circle of Willis without evidence for significant proximal stenosis, aneurysm, or branch vessel occlusion. 4. Normal CT appearance of the brain. Electronically Signed   By: Marin Roberts M.D.   On: 02/10/2015 21:37   Mr Brain Wo Contrast  02/10/2015  CLINICAL DATA:  24 year old male with acute onset of right side body tingling, paresthesia yesterday at 1630 hours. Treated and released at [facility]. Today new onset blurred vision in the left eye, slurred speech. Initial encounter. EXAM: MRI HEAD WITHOUT CONTRAST TECHNIQUE: Multiplanar, multiecho pulse sequences of the brain and surrounding structures were obtained without intravenous contrast. COMPARISON:  Head CT without contrast 1032 hours today. FINDINGS: Cerebral volume is normal. No restricted diffusion to suggest acute infarction. No midline shift, mass effect, evidence of mass lesion, ventriculomegaly, extra-axial collection or acute intracranial hemorrhage. Cervicomedullary junction and pituitary are within normal limits. Major intracranial vascular flow voids are within normal limits. Wallace Cullens and white matter signal is within normal limits throughout the brain. No chronic cerebral blood products. Visible internal auditory structures appear normal. Mastoids are clear. Paranasal sinuses are clear. Orbit and scalp soft tissues appear normal. Negative visualized cervical spine. Visualized bone marrow signal is within normal limits. IMPRESSION: Normal noncontrast MRI appearance of the brain. Electronically Signed   By: Odessa Fleming M.D.   On: 02/10/2015 13:51      CBC  Recent Labs Lab 02/10/15 1635  WBC 2.4*  HGB 11.1*  HCT 34.8*  PLT 181  MCV 86.8  MCH 27.7  MCHC 31.9  RDW 13.2  LYMPHSABS 0.5*  MONOABS 0.2  EOSABS 0.0  BASOSABS 0.0    Chemistries   Recent Labs Lab 02/10/15 1635  NA 138  K 4.0  CL 101  CO2 26  GLUCOSE 87  BUN 8  CREATININE 1.03  CALCIUM 9.0   ------------------------------------------------------------------------------------------------------------------ estimated creatinine clearance is 103.1 mL/min (by C-G formula based on Cr of 1.03). ------------------------------------------------------------------------------------------------------------------  Recent Labs  02/10/15 1910  HGBA1C 5.2   ------------------------------------------------------------------------------------------------------------------  Recent Labs  02/10/15 1635  CHOL 158  HDL 25*  LDLCALC 103*  TRIG 148  CHOLHDL 6.3   ------------------------------------------------------------------------------------------------------------------ No results for input(s): TSH,  T4TOTAL, T3FREE, THYROIDAB in the last 72 hours.  Invalid input(s): FREET3 ------------------------------------------------------------------------------------------------------------------ No results for input(s): VITAMINB12, FOLATE, FERRITIN, TIBC, IRON, RETICCTPCT in the last 72 hours.  Coagulation profile No results for input(s): INR, PROTIME in the last 168 hours.  No results for input(s): DDIMER in the last 72 hours.  Cardiac Enzymes No results for input(s): CKMB, TROPONINI, MYOGLOBIN in the last 168 hours.  Invalid input(s): CK ------------------------------------------------------------------------------------------------------------------ Invalid input(s): POCBNP   Time Spent in minutes  35   Kellar Westberg K M.D on 02/11/2015 at 11:05 AM  Between 7am to 7pm - Pager - [phone]  After 7pm go to [email or web address] - password [facility]  [facility] -  Office  [phone]

## 2015-02-11 NOTE — Progress Notes (Signed)
  Echocardiogram 2D Echocardiogram has been performed.  Dan Adams 02/11/2015, 9:28 AM

## 2015-02-11 NOTE — Evaluation (Signed)
Physical Therapy Evaluation and Discharge Patient Details Name: Dan Adams MRN: [id number] DOB: 1991-04-03 Today's Date: 02/11/2015   History of Present Illness  Adm after several days of intermittent numbness and weakness of extremities (at times Rt leg and arm, then Lt); MRI negative  PMHx- migraines, lupus, recent loss of job, home, girlfriend    Clinical Impression  Patient evaluated by Physical Therapy with no further acute PT needs identified. The patient has no further questions. He reports arms still feel "a bit different," and denies any changes in legs, walking or balance.  PT is signing off. Thank you for this referral.     Follow Up Recommendations No PT follow up    Equipment Recommendations  None recommended by PT    Recommendations for Other Services       Precautions / Restrictions Precautions Precautions: None      Mobility  Bed Mobility Overal bed mobility: Independent                Transfers Overall transfer level: Independent Equipment used: None                Ambulation/Gait Ambulation/Gait assistance: Independent Ambulation Distance (Feet): 250 Feet Assistive device: None Gait Pattern/deviations: WFL(Within Functional Limits)   Gait velocity interpretation: at or above normal speed for age/gender    Stairs Stairs: Yes Stairs assistance: Independent Stair Management: No rails;Forwards Number of Stairs: 5    Wheelchair Mobility    Modified Rankin (Stroke Patients Only) Modified Rankin (Stroke Patients Only) Pre-Morbid Rankin Score: No symptoms Modified Rankin: No significant disability     Balance Overall balance assessment: Independent                               Standardized Balance Assessment Standardized Balance Assessment : Berg Balance Test;Dynamic Gait Index Berg Balance Test Sit to Stand: Able to stand without using hands and stabilize independently Standing Unsupported: Able to stand  safely 2 minutes Sitting with Back Unsupported but Feet Supported on Floor or Stool: Able to sit safely and securely 2 minutes Stand to Sit: Sits safely with minimal use of hands Transfers: Able to transfer safely, minor use of hands Standing Unsupported with Eyes Closed: Able to stand 10 seconds safely Standing Ubsupported with Feet Together: Able to place feet together independently and stand 1 minute safely From Standing, Reach Forward with Outstretched Arm: Can reach confidently >25 cm (10") From Standing Position, Pick up Object from Floor: Able to pick up shoe safely and easily From Standing Position, Turn to Look Behind Over each Shoulder: Looks behind from both sides and weight shifts well Turn 360 Degrees: Able to turn 360 degrees safely in 4 seconds or less Standing Unsupported, Alternately Place Feet on Step/Stool: Able to stand independently and safely and complete 8 steps in 20 seconds Standing Unsupported, One Foot in Front: Able to place foot tandem independently and hold 30 seconds Standing on One Leg: Able to lift leg independently and hold > 10 seconds Total Score: 56 Dynamic Gait Index Level Surface: Normal Change in Gait Speed: Normal Gait with Horizontal Head Turns: Normal Gait with Vertical Head Turns: Normal Gait and Pivot Turn: Normal Step Over Obstacle: Normal Step Around Obstacles: Normal Steps: Normal Total Score: 24       Pertinent Vitals/Pain Pain Assessment: Faces Faces Pain Scale: Hurts a little bit Pain Location: feet and ankles due to lupus Pain Descriptors / Indicators: Aching Pain Intervention(s):  Limited activity within patient's tolerance;Monitored during session    Home Living Family/patient expects to be discharged to:: Private residence Living Arrangements: Parent;Non-relatives/Friends;Other relatives                    Prior Function Level of Independence: Independent               Hand Dominance         Extremity/Trunk Assessment   Upper Extremity Assessment: Defer to OT evaluation           Lower Extremity Assessment: RLE deficits/detail;LLE deficits/detail      Cervical / Trunk Assessment: Normal  Communication   Communication: No difficulties  Cognition Arousal/Alertness: Awake/alert Behavior During Therapy: WFL for tasks assessed/performed Overall Cognitive Status: Within Functional Limits for tasks assessed                      General Comments      Exercises        Assessment/Plan    PT Assessment Patent does not need any further PT services  PT Diagnosis Difficulty walking   PT Problem List    PT Treatment Interventions     PT Goals (Current goals can be found in the Care Plan section) Acute Rehab PT Goals PT Goal Formulation: All assessment and education complete, DC therapy    Frequency     Barriers to discharge        Co-evaluation               End of Session Equipment Utilized During Treatment: Gait belt Activity Tolerance: Patient tolerated treatment well Patient left: in chair;with call bell/phone within reach Nurse Communication: Mobility status    Functional Assessment Tool Used: Sharlene Motts Functional Limitation: Mobility: Walking and moving around Mobility: Walking and Moving Around Current Status [phone]): 0 percent impaired, limited or restricted Mobility: Walking and Moving Around Goal Status [phone]): 0 percent impaired, limited or restricted Mobility: Walking and Moving Around Discharge Status [phone]): 0 percent impaired, limited or restricted    Time: 0955-1009 PT Time Calculation (min) (ACUTE ONLY): 14 min   Charges:   PT Evaluation $Initial PT Evaluation Tier I: 1 Procedure     PT G Codes:   PT G-Codes **NOT FOR INPATIENT CLASS** Functional Assessment Tool Used: Berg Functional Limitation: Mobility: Walking and moving around Mobility: Walking and Moving Around Current Status [phone]): 0 percent impaired, limited  or restricted Mobility: Walking and Moving Around Goal Status [phone]): 0 percent impaired, limited or restricted Mobility: Walking and Moving Around Discharge Status [phone]): 0 percent impaired, limited or restricted    Meagan Ancona 02/11/2015, 10:18 AM Pager [phone]

## 2015-02-11 NOTE — Progress Notes (Signed)
*  PRELIMINARY RESULTS* Vascular Ultrasound Carotid Duplex (Doppler) has been completed.  Preliminary findings: No significant ICA stenosis. Antegrade vertebral flow.  Farrel Demark, RDMS, RVT  02/11/2015, 12:30 PM

## 2015-02-11 NOTE — Progress Notes (Signed)
Initial Nutrition Assessment  DOCUMENTATION CODES:   Non-severe (moderate) malnutrition in context of acute illness/injury  INTERVENTION:  Recommend checking phosphorus, magnesium, and thiamine labs due to concerns for possible refeeding syndrome occurring 1-2 weeks ago  Provide Ensure Enlive po BID, each supplement provides 350 kcal and 20 grams of protein  Encouraged adequate, healthful PO intake  NUTRITION DIAGNOSIS:   Malnutrition related to acute illness as evidenced by energy intake < 75% for > 7 days, mild depletion of muscle mass, percent weight loss.   GOAL:   Patient will meet greater than or equal to 90% of their needs   MONITOR:   PO intake, Labs, Weight trends  REASON FOR ASSESSMENT:   Malnutrition Screening Tool    ASSESSMENT:   24 y.o. male with a history of lupus, hypertension who presents with 3 episodes over the past 24 hours of numbness/weakness. Yesterday (02/09/2015) he had right arm and leg and face numbness that lasted for approximately 20 minutes and he went to [facility] and was discharged home. Today 02/10/2015, he has had 2 episodes. He initially had difficulty holding things with his left hand and slight slurred speech that lasted for approximately an hour. This resolved, however then later he had recurrence of symptoms lasting for approximately 20 minutes but there are more severe. He had slurred speech and weakness/numbness of his arm and leg. He has had worsening headaches over the past 1.5 weeks.   Patient states that he lost from 187 lbs down to 163 lbs in one month due to eating poorly. He had mouth ulcers as well as poor appetite/early satiety and was only eating a couple bites of food at each meal for one month. He states that he started eating better 2 weeks ago. He states he has since regained to 174 lbs. He has been eating 100% of most meals since admission.  Per weight history, pt lost from 191 lbs to 163 lbs within a 4 month period from  July to November- 15% weight loss is severe for time frame. He has some mild muscle wasting per physical exam. Based on pt's report and physical exam, pt meets nutrition-related criteria for acute moderate malnutrition. Concern for possible refeeding syndrome in the past 2 weeks due to minimal calorie intake for one month and rapid weight loss.   Labs: low hemoglobin  Diet Order:  Diet Heart Room service appropriate?: Yes; Fluid consistency:: Thin  Skin:  Reviewed, no issues  Last BM:  PTA  Height:   Ht Readings from Last 1 Encounters:  02/10/15 5\' 5"  (1.651 m)    Weight:   Wt Readings from Last 1 Encounters:  02/10/15 163 lb (73.936 kg)    Ideal Body Weight:  61.8 kg  BMI:  Body mass index is 27.12 kg/(m^2).  Estimated Nutritional Needs:   Kcal:  2000-2200  Protein:  70-85 grams/day  Fluid:  2.2 L/day  EDUCATION NEEDS:   No education needs identified at this time  13/03/16 RD, LDN Inpatient Clinical Dietitian Pager: [phone] After Hours Pager: [phone]

## 2015-02-11 NOTE — Progress Notes (Signed)
STROKE TEAM PROGRESS NOTE   HISTORY Dan Adams is a 24 y.o. male with a history of lupus, hypertension who presents with 3 episodes over the past 24 hours of numbness/weakness. Yesterday (02/09/2015) he had right arm and leg and face numbness that lasted for approximately 20 minutes and he went to [facility] and was discharged home. Today 02/10/2015, he has had 2 episodes. He initially had difficulty holding things with his left hand and slight slurred speech that lasted for approximately an hour. This resolved, however then later he had recurrence of symptoms lasting for approximately 20 minutes but there are more severe. He had slurred speech and weakness/numbness of his arm and leg. He has had worsening headaches over the past 1.5 weeks. He does have a history of headaches, but the ones over the past 1.5 weeks have been increasing in severity. He does not have any headache today, and did not have a headache with these events. He was LKW 02/09/2015. Patient was not administered TPA secondary to resolved symptoms. He was admitted for further evaluation and treatment.   SUBJECTIVE (INTERVAL HISTORY) His family is at the bedside.  Overall he feels his condition is stable. He reports numbness improved this am, reports a mild headache this am.He recounted his history with Dr. Pearlean Brownie.   OBJECTIVE Temp:  [98.3 F (36.8 C)-99.5 F (37.5 C)] 98.6 F (37 C) (11/04 0600) Pulse Rate:  [74-101] 81 (11/04 0600) Cardiac Rhythm:  [-] Normal sinus rhythm (11/03 2200) Resp:  [16-19] 18 (11/04 0600) BP: (134-166)/(89-104) 152/95 mmHg (11/04 0600) SpO2:  [98 %-100 %] 100 % (11/04 0600) Weight:  [73.936 kg (163 lb)] 73.936 kg (163 lb) (11/03 1011)  CBC:  Recent Labs Lab 02/10/15 1635  WBC 2.4*  NEUTROABS 1.6*  HGB 11.1*  HCT 34.8*  MCV 86.8  PLT 181    Basic Metabolic Panel:  Recent Labs Lab 02/10/15 1635  NA 138  K 4.0  CL 101  CO2 26  GLUCOSE 87  BUN 8  CREATININE 1.03  CALCIUM 9.0     Lipid Panel:    Component Value Date/Time   CHOL 158 02/10/2015 1635   TRIG 148 02/10/2015 1635   HDL 25* 02/10/2015 1635   CHOLHDL 6.3 02/10/2015 1635   VLDL 30 02/10/2015 1635   LDLCALC 103* 02/10/2015 1635   HgbA1c: No results found for: HGBA1C Urine Drug Screen: No results found for: LABOPIA, COCAINSCRNUR, LABBENZ, AMPHETMU, THCU, LABBARB    IMAGING  Dg Chest 2 View 02/10/2015  No active cardiopulmonary disease.   Ct Head Wo Contrast 02/10/2015   Negative exam.   Ct Angio Head and Neck W/cm &/or Wo/cm 02/10/2015  1. Tortuosity of the distal cervical internal carotid arteries is advanced for age, left greater than right. There is no associated stenosis, vascular injury, or FMD. 2. Mild tortuosity in the vertebral arteries in the neck. 3. Normal CTA circle of Willis without evidence for significant proximal stenosis, aneurysm, or branch vessel occlusion. 4. Normal CT appearance of the brain.   Mr Brain Wo Contrast 02/10/2015  Normal noncontrast MRI appearance of the brain.    PHYSICAL EXAM Young african [place] male not in distress. . Afebrile. Head is nontraumatic. Neck is supple without bruit.    Cardiac exam no murmur or gallop. Lungs are clear to auscultation. Distal pulses are well felt. Neurological Exam ;  Awake  Alert oriented x 3. Normal speech and language.eye movements full without nystagmus.fundi were not visualized. Vision acuity and fields appear normal.  Hearing is normal. Palatal movements are normal. Face symmetric. Tongue midline. Normal strength, tone, reflexes and coordination. Normal sensation. Gait deferred. ASSESSMENT/PLAN [name] is a 24 y.o. male with history of lupus, migraines and hypertension presenting with 3 episodes of right arm and leg numbness accompanied by a headache. He did not receive IV t-PA due to resolved symptoms.   Stress/Anxiety  Recently lost his job, house and girlfriend  Reported feeling hot, dizzy during an  episode  Mother reports he is stressed with having lupus as well  Migraines  Typically has associated nausea and vomiting  Takes aleve and ibuprofen without relief  3-5 migraines/month  HA are debilitating, requiring him to go to bed  No family hx migraines  No Stroke  Resultant  Neuro deficits resolved  MRI  No acute stroke  CTA head & neck  No significant large vessel stenosis  Carotid Doppler  pending   2D Echo  pending   LDL 103  HgbA1c pending  Lovenox 30 mg sq daily for VTE prophylaxis  Diet Heart Room service appropriate?: Yes; Fluid consistency:: Thin  No antithrombotic prior to admission, now on aspirin 325 mg daily. Recommend aspirin 81 mg daily at discharge given lupus.  Therapy recommendations:  No needs  Disposition:  Return home  Hypertension  Stable  Hyperlipidemia  Home meds:  No statin  LDL 103  Other Stroke Risk Factors  Former Cigarette smoker, quit smoking   Migraines     Lupus  A stroke risk factor  Dx in 2014  No hx VTE  Check Antiphospholipid abx, cardiolipin abx  If testing positive, may need to look at anticoagulants. For now, treat with aspirin 81 mg daily  Hospital day #   Dan Adams [facility] See Amion for Pager information 02/11/2015 9:41 AM  I have personally examined this patient, reviewed notes, independently viewed imaging studies, participated in medical decision making and plan of care. I have made any additions or clarifications directly to the above note. Agree with note above. He presented with transient paresthesias on either side of the body and mild headache with significant underlying psychosocial stressors. Differential includes primary hemispheric TIAs versus atypical migraine or episodes related to underlying anxiety/stress. He remains at risk for neurological worsening, recurrent episodes and needs ongoing evaluation and this factor modification.  Delia Heady, MD Medical  Director [facility] Pager: [phone] 02/11/2015 6:38 PM    To contact Stroke Continuity provider, please refer to [email or web address]. After hours, contact General Neurology

## 2015-02-11 NOTE — Progress Notes (Signed)
SLP Cancellation Note  Patient Details Name: Dan Adams MRN: [id number] DOB: [birth year]   Cancelled treatment:       Reason Eval/Treat Not Completed: SLP screened, no needs identified, will sign off   Blenda Mounts Laurice 02/11/2015, 11:27 AM

## 2015-02-12 DIAGNOSIS — R2990 Unspecified symptoms and signs involving the nervous system: Secondary | ICD-10-CM

## 2015-02-12 LAB — BASIC METABOLIC PANEL WITH GFR
Anion gap: 8 (ref 5–15)
BUN: 14 mg/dL (ref 6–20)
CO2: 27 mmol/L (ref 22–32)
Calcium: 8.9 mg/dL (ref 8.9–10.3)
Chloride: 101 mmol/L (ref 101–111)
Creatinine, Ser: 0.96 mg/dL (ref 0.61–1.24)
GFR calc Af Amer: >60 mL/min
GFR calc non Af Amer: >60 mL/min
Glucose, Bld: 90 mg/dL (ref 65–99)
Potassium: 3.7 mmol/L (ref 3.5–5.1)
Sodium: 136 mmol/L (ref 135–145)

## 2015-02-12 LAB — PHOSPHORUS: Phosphorus: 4.8 mg/dL — ABNORMAL HIGH (ref 2.5–4.6)

## 2015-02-12 LAB — MAGNESIUM: Magnesium: 1.8 mg/dL (ref 1.7–2.4)

## 2015-02-12 MED ORDER — ASPIRIN 81 MG PO TBEC: 81.0000 mg | DELAYED_RELEASE_TABLET | Freq: Every day | ORAL | Status: AC

## 2015-02-12 MED ORDER — ATORVASTATIN CALCIUM 40 MG PO TABS: 40.0000 mg | ORAL_TABLET | Freq: Every day | ORAL | Status: DC

## 2015-02-12 MED ORDER — AMLODIPINE BESYLATE 10 MG PO TABS: 10.0000 mg | ORAL_TABLET | Freq: Every day | ORAL | Status: DC

## 2015-02-12 MED ORDER — AMLODIPINE BESYLATE 10 MG PO TABS
10.0000 mg | ORAL_TABLET | Freq: Every day | ORAL | Status: DC
  Administered 2015-02-12: 10 mg via ORAL
  Filled 2015-02-12: qty 1

## 2015-02-12 MED ORDER — CYANOCOBALAMIN 1000 MCG/ML IJ SOLN
1000.0000 ug | Freq: Once | INTRAMUSCULAR | Status: AC
  Administered 2015-02-12: 1000 ug via INTRAMUSCULAR
  Filled 2015-02-12: qty 1

## 2015-02-12 NOTE — Progress Notes (Signed)
2155 hrs,Pt BP 167/115, with prior readings of 164/107, 155/99 during day. On call notified, advised to monitor for any further elevation or changes with Pt and that she would notify day shift MD of concerns.

## 2015-02-12 NOTE — Progress Notes (Signed)
STROKE TEAM PROGRESS NOTE   HISTORY Santiel Topper is a 24 y.o. male with a history of lupus, hypertension who presents with 3 episodes over the past 24 hours of numbness/weakness. Yesterday he had right arm and leg and face numbness that lasted for approximately 20 minutes and he went to [facility] and was discharged home. Today, he has had 2 episodes concerning. He initially had difficulty holding things with his left hand and slight slurred speech that lasted for approximately an hour. This resolved, however then later he had recurrence of symptoms lasting for approximately 20 minutes but there are more severe. He had slurred speech and weakness/numbness of his arm and leg.  He has had worsening headaches over the past 1.5 weeks. He does have a history of headaches, but the ones over the past 1.5 weeks have been increasing in severity. He does not have any headache today, and did not have a headache with these events.   LKW: Yesterday tpa given?: no, resolve symptoms   SUBJECTIVE (INTERVAL HISTORY) His mother is at the bedside.  Overall he feels his condition is completely resolved. We discussed the importance of follow-up with neurology and compliance with medications.  At the time of my visit, RN was completing discharge procedures.  My visit was brief.  I answered all questions from both patient and mother.   OBJECTIVE Temp:  [98.3 F (36.8 C)-98.9 F (37.2 C)] 98.4 F (36.9 C) (11/05 0636) Pulse Rate:  [84-96] 89 (11/05 0636) Cardiac Rhythm:  [-] Normal sinus rhythm (11/04 1912) Resp:  [18-20] 20 (11/05 0636) BP: (154-167)/(86-115) 154/107 mmHg (11/05 0636) SpO2:  [100 %] 100 % (11/05 0636)  CBC:  Recent Labs Lab 02/10/15 1635  WBC 2.4*  NEUTROABS 1.6*  HGB 11.1*  HCT 34.8*  MCV 86.8  PLT 181    Basic Metabolic Panel:  Recent Labs Lab 02/10/15 1635 02/12/15 0255  NA 138 136  K 4.0 3.7  CL 101 101  CO2 26 27  GLUCOSE 87 90  BUN 8 14  CREATININE 1.03 0.96   CALCIUM 9.0 8.9  MG  --  1.8  PHOS  --  4.8*    Lipid Panel:    Component Value Date/Time   CHOL 158 02/10/2015 1635   TRIG 148 02/10/2015 1635   HDL 25* 02/10/2015 1635   CHOLHDL 6.3 02/10/2015 1635   VLDL 30 02/10/2015 1635   LDLCALC 103* 02/10/2015 1635   HgbA1c:  Lab Results  Component Value Date   HGBA1C 5.2 02/10/2015   Urine Drug Screen: No results found for: LABOPIA, COCAINSCRNUR, LABBENZ, AMPHETMU, THCU, LABBARB   IMAGING  Ct Angio Head and Neck W/cm &/or Wo Cm 02/10/2015   1. Tortuosity of the distal cervical internal carotid arteries is advanced for age, left greater than right. There is no associated stenosis, vascular injury, or FMD.  2. Mild tortuosity in the vertebral arteries in the neck.  3. Normal CTA circle of Willis without evidence for significant proximal stenosis, aneurysm, or branch vessel occlusion.  4. Normal CT appearance of the brain.    Dg Chest 2 View 02/10/2015  No active cardiopulmonary disease.    Ct Head Wo Contrast 02/10/2015   Negative exam.   Mr Brain Wo Contrast 02/10/2015   Normal noncontrast MRI appearance of the brain.    ASSESSMENT/PLAN Mr. Peirce Deveney is a 24 y.o. male with history of Systemic lupus, hypertension, migraine headaches, thrombocytopenia, rheumatoid arthritis, and anxiety presenting with transient slurred speech with transient bilateral  numbness and weakness.  He did not receive IV t-PA due to resolution of deficits.  Possible TIA:   Bilateral  Resultant resolution of deficits  MRI  normal  MRA  not performed  CTA of head and neck - Tortuosity but o/w unremarkable  Carotid Doppler - Preliminary findings: No significant ICA stenosis. Antegrade vertebral flow.  2D Echo - EF 60-65%. No cardiac source of emboli identified.  LDL 103  HgbA1c 5.2  VTE prophylaxis - Lovenox  Diet Heart Room service appropriate?: Yes; Fluid consistency:: Thin  No antithrombotic prior to admission, now on aspirin 81  mg daily  Patient counseled to be compliant with his antithrombotic medications  Ongoing aggressive stroke risk factor management  Therapy recommendations:  No follow-up therapy recommended   Disposition: - Pending  Hypertension  Elevated diastolic blood pressures  Permissive hypertension (OK if < 220/120) but gradually normalize in 5-7 days  Hyperlipidemia  Home meds: No lipid lowering medications prior to admission  LDL 103, goal < 70  Now on Lipitor 40 mg daily  Continue statin at discharge  Other Stroke Risk Factors  Cigarette smoker, quit smoking   Migraines   Other Active Problems  Mild anemia  Leukopenia  Hospital day #   Delton See PA-C [facility] Pager [phone] 02/12/2015, 8:49 AM  NEUROLOGY ATTENDING NOTE Patient was seen by me personally. I reviewed notes, independently viewed imaging studies, participated in medical decision making and plan of care. I have made additions or clarifications directly to the above note.  Documentation accurately reflects findings. The laboratory and radiographic studies were personally reviewed by me.  ROS completed by me personally and pertinent positives fully documented. Assessment and plan completed by me personally and fully documented above.  Condition is improved    I spent 30 minutes in discussion with patient and mother as well as chart review.  Patient and mother mother understand the risk of autoimmune disease and cerebrovascular disease.  If patient continues to have symptoms, he may need to undergo cerebral angiography to rule out vasculitis.  Agree with antiplatelet therapy and Neurology outpatient follow-up  SIGNED BY: Dr. Sula Soda     To contact Stroke Continuity provider, please refer to [email or web address]. After hours, contact General Neurology

## 2015-02-12 NOTE — Discharge Instructions (Signed)
Follow with Primary MD Sonda Primes, MD in 3-4 days   Get CBC, CMP, B12,  checked  by Primary MD next visit.    Activity: As tolerated with Full fall precautions use walker/cane & assistance as needed   Disposition Home    Diet: Heart Healthy    For Heart failure patients - Check your Weight same time everyday, if you gain over 2 pounds, or you develop in leg swelling, experience more shortness of breath or chest pain, call your Primary MD immediately. Follow Cardiac Low Salt Diet and 1.5 lit/day fluid restriction.   On your next visit with your primary care physician please Get Medicines reviewed and adjusted.   Please request your Prim.MD to go over all Hospital Tests and Procedure/Radiological results at the follow up, please get all Hospital records sent to your Prim MD by signing hospital release before you go home.   If you experience worsening of your admission symptoms, develop shortness of breath, life threatening emergency, suicidal or homicidal thoughts you must seek medical attention immediately by calling 911 or calling your MD immediately  if symptoms less severe.  You Must read complete instructions/literature along with all the possible adverse reactions/side effects for all the Medicines you take and that have been prescribed to you. Take any new Medicines after you have completely understood and accpet all the possible adverse reactions/side effects.   Do not drive, operating heavy machinery, perform activities at heights, swimming or participation in water activities or provide baby sitting services if your were admitted for syncope or siezures until you have seen by Primary MD or a Neurologist and advised to do so again.  Do not drive when taking Pain medications.    Do not take more than prescribed Pain, Sleep and Anxiety Medications  Special Instructions: If you have smoked or chewed Tobacco  in the last 2 yrs please stop smoking, stop any regular Alcohol  and  or any Recreational drug use.  Wear Seat belts while driving.   Please note  You were cared for by a hospitalist during your hospital stay. If you have any questions about your discharge medications or the care you received while you were in the hospital after you are discharged, you can call the unit and asked to speak with the hospitalist on call if the hospitalist that took care of you is not available. Once you are discharged, your primary care physician will handle any further medical issues. Please note that NO REFILLS for any discharge medications will be authorized once you are discharged, as it is imperative that you return to your primary care physician (or establish a relationship with a primary care physician if you do not have one) for your aftercare needs so that they can reassess your need for medications and monitor your lab values.

## 2015-02-12 NOTE — Progress Notes (Signed)
Discharge orders received, Pt for discharge home today. IV d/c'd. D/c instructions and RX given with verbalized understanding. Family at bedside to assist patient with discharge. Staff bought pt downstairs via wheelchair. 02/12/15 1306

## 2015-02-12 NOTE — Discharge Summary (Signed)
Dan Adams, is a 24 y.o. male  DOB Mar 16, 1991  MRN [id number].  Admission date:  02/10/2015  Admitting Physician  Ozella Rocks, MD  Discharge Date:  02/12/2015   Primary MD  Sonda Primes, MD  Recommendations for primary care physician for things to follow:   B 12 levels, Methylmelonic acid levels, hypercoagulable panel results in 3-5 days.   Check CBC BMP next visit. May benefit from IM B-12 shots twice a month.   Admission Diagnosis  Neurologic disorder [G98.8]   Discharge Diagnosis  Neurologic disorder [G98.8]    Principal Problem:   Stroke-like symptoms Active Problems:   Leukopenia   Lupus (systemic lupus erythematosus) (HCC)   Hypertension, secondary   TIA (transient ischemic attack)      Past Medical History  Diagnosis Date  . Leukopenia 09/07/2013  . Thrombocytopenia, unspecified (HCC) 09/07/2013  . Systemic lupus (HCC)   . Hypertension   . Childhood asthma   . Migraine     "@ least 2-3 times/wk now" (02/10/2015)  . RA (rheumatoid arthritis) (HCC)     "feet, fingers, knees; it's from my lupus" (02/10/2015)  . Chronic back pain   . Anxiety   . Depression   . Stroke-like symptoms 02/09/2015-02/10/2015    "right-left"    Past Surgical History  Procedure Laterality Date  . Tonsillectomy and adenoidectomy  2007       HPI  from the history and physical done on the day of admission:    Dan Adams is a very pleasant 24 y.o. male with a past medical history that includes lupus, hypertension, rheumatoid arthritis, thrombocytopenia, Leukopenia presents with the chief complaint of 3 episodes over the past 24 hours of numbness/weakness. Was evaluated by neurology in the emergency department who recommended admission for TIA workup.  Information is obtained from the patient and his  mother who is at the bedside. He reports that yesterday he had right arm and leg and right sided face numbness with some tingling. He reports that lasted about 20 minutes. He went to urgent care Center and it was discovered at that time that his blood pressure was uncontrolled so [facility]. He reports they got his blood pressure under control and he was discharged to home. His mother reports this morning he awakened and he had slurred speech with left-handed weakness and this lasted about an hour. Associated symptoms include intermittent headache that is worse than his usual headaches with intermittent blurred vision in his left eye. He denies and unsteady gait or any falling. He denies difficulty swallowing. He denies chest pain or palpitation shortness of breath. He denies any recent fever chills or sick contacts.  Workup in the emergency department reveals basic metabolic panel that is unremarkable. Complete blood count significant for WBCs of 2.4 hemoglobin 11.1. Urinalysis unremarkable. ET of the head unremarkable, RI the brain unremarkable. EKG was sinus rhythm.  In the emergency department he is afebrile somewhat hypertensive and hemodynamically stable not hypoxic    Hospital Course:  1. Bilateral neurological symptoms suspicious for embolic TIA. Neurology on board, symptoms currently resolved, so far MRI brain, CT angiogram of the head and neck nonacute. Echogram stable. On aspirin statin for secondary prevention. LDL was above goal, A1c was stable. He was seen by neurology and cleared for discharge today. Note hypercoagulable panel ordered by neurology is pending kindly follow. His B-12 level was borderline, I question if his diffuse bilateral symptoms are due to clinical B-12 deficiency. I have given him one shot of IM B-12, have ordered Methylmelonic acid levels, will request PCP to consider placing him on IM B-12 shots. Please follow hypercoagulable panel. He will  follow with neurology outpatient. Her energy symptom free.   2. History of lupus. Continue home medications unchanged. Includes Actonel along with prednisone. Outpatient ophthalmology follow-up.   3. Dyslipidemia. Placed on statin.   4. Central hypertension. Resume home ARB added Norvasc for better control.   5. History of low B-12. On supplementation continue. B12 level borderline, I have given him one shot of IM B-12, have ordered Methylmelonic acid levels, request PCP to monitor and consider placing him on IM B-12 shots instead of oral.     Discharge Condition: Stable  Follow UP  Follow-up Information    Follow up with Sonda Primes, MD. Schedule an appointment as soon as possible for a visit in 1 week.   Specialty:  Internal Medicine   Contact information:   [address] [place] [address] [phone]       Follow up with SETHI,PRAMOD, MD. Schedule an appointment as soon as possible for a visit in 1 week.   Specialties:  Neurology, Radiology   Contact information:   [address] [place] [address] [phone]        Consults obtained - Neuro  Diet and Activity recommendation: See Discharge Instructions below  Discharge Instructions       Discharge Instructions    Diet - low sodium heart healthy    Complete by:  As directed      Discharge instructions    Complete by:  As directed   Follow with Primary MD Sonda Primes, MD in 3-4 days   Get CBC, CMP, B12,  checked  by Primary MD next visit.    Activity: As tolerated with Full fall precautions use walker/cane & assistance as needed   Disposition Home    Diet: Heart Healthy    For Heart failure patients - Check your Weight same time everyday, if you gain over 2 pounds, or you develop in leg swelling, experience more shortness of breath or chest pain, call your Primary MD immediately. Follow Cardiac Low Salt Diet and 1.5 lit/day fluid restriction.   On your next visit with your  primary care physician please Get Medicines reviewed and adjusted.   Please request your Prim.MD to go over all Hospital Tests and Procedure/Radiological results at the follow up, please get all Hospital records sent to your Prim MD by signing hospital release before you go home.   If you experience worsening of your admission symptoms, develop shortness of breath, life threatening emergency, suicidal or homicidal thoughts you must seek medical attention immediately by calling 911 or calling your MD immediately  if symptoms less severe.  You Must read complete instructions/literature along with all the possible adverse reactions/side effects for all the Medicines you take and that have been prescribed to you. Take any new Medicines after you have completely understood and accpet all the possible adverse  reactions/side effects.   Do not drive, operating heavy machinery, perform activities at heights, swimming or participation in water activities or provide baby sitting services if your were admitted for syncope or siezures until you have seen by Primary MD or a Neurologist and advised to do so again.  Do not drive when taking Pain medications.    Do not take more than prescribed Pain, Sleep and Anxiety Medications  Special Instructions: If you have smoked or chewed Tobacco  in the last 2 yrs please stop smoking, stop any regular Alcohol  and or any Recreational drug use.  Wear Seat belts while driving.   Please note  You were cared for by a hospitalist during your hospital stay. If you have any questions about your discharge medications or the care you received while you were in the hospital after you are discharged, you can call the unit and asked to speak with the hospitalist on call if the hospitalist that took care of you is not available. Once you are discharged, your primary care physician will handle any further medical issues. Please note that NO REFILLS for any discharge medications  will be authorized once you are discharged, as it is imperative that you return to your primary care physician (or establish a relationship with a primary care physician if you do not have one) for your aftercare needs so that they can reassess your need for medications and monitor your lab values.     Increase activity slowly    Complete by:  As directed              Discharge Medications       Medication List    TAKE these medications        amLODipine 10 MG tablet  Commonly known as:  NORVASC  Take 1 tablet (10 mg total) by mouth daily.     aspirin 81 MG EC tablet  Take 1 tablet (81 mg total) by mouth daily.     atorvastatin 40 MG tablet  Commonly known as:  LIPITOR  Take 1 tablet (40 mg total) by mouth daily at 6 PM.     HYDROXYCHLOROQUINE SULFATE PO  Take 200 mg by mouth every morning. 2 tabs in am = 400 mg     losartan 100 MG tablet  Commonly known as:  COZAAR  Take 1 tablet (100 mg total) by mouth daily.     predniSONE 10 MG tablet  Commonly known as:  DELTASONE  5 mg po qd pc     Vitamin B-12 1000 MCG Subl  Place 1 tablet (1,000 mcg total) under the tongue daily.     Vitamin D3 2000 UNITS capsule  Take 1 capsule (2,000 Units total) by mouth daily.        Major procedures and Radiology Reports - PLEASE review detailed and final reports for all details, in brief -   CT, CTA head and neck, MRI brain. All nonacute  Echogram  - Left ventricle: The cavity size was normal. Wall thickness wasnormal. Systolic function was normal. The estimated ejectionfraction was in the range of 60% to 65%. Wall motion was normal;there were no regional wall motion abnormalities. Dopplerparameters are consistent with abnormal left ventricularrelaxation (grade 1 diastolic dysfunction). - Mitral valve: There was mild regurgitation. - Atrial septum: No defect or patent foramen ovale was identified.   Carotids - Carotid Duplex (Doppler) has been completed. Preliminary  findings: No significant ICA stenosis. Antegrade vertebral flow.   Ct Angio Head W/cm &/  or Wo Cm  02/10/2015  CLINICAL DATA:  Bilateral upper and lower extremity weakness, fever, and neck pain. Dizziness. EXAM: CT ANGIOGRAPHY HEAD AND NECK TECHNIQUE: Multidetector CT imaging of the head and neck was performed using the standard protocol during bolus administration of intravenous contrast. Multiplanar CT image reconstructions and MIPs were obtained to evaluate the vascular anatomy. Carotid stenosis measurements (when applicable) are obtained utilizing NASCET criteria, using the distal internal carotid diameter as the denominator. CONTRAST:  70mL OMNIPAQUE IOHEXOL 350 MG/ML SOLN COMPARISON:  MRI brain and CT head without contrast from the same day. FINDINGS: CT HEAD Brain: No acute infarct, hemorrhage, or mass lesion is present. The ventricles are of normal size. No significant extraaxial fluid collection is present. Calvarium and skull base: Within normal limits Paranasal sinuses: Clear Orbits: Negative CTA NECK Aortic arch: The left vertebral artery originates from the aortic arch. A 4 vessel arch is present. There is no significant atherosclerotic disease or stenosis. The upper mediastinum is unremarkable. Right carotid system: The right common carotid artery is within normal limits. The bifurcation is within normal limits. The cervical right ICA is normal. There is mild tortuosity of the high cervical right ICA just below the skullbase without significant stenosis. Left carotid system: The left common carotid artery is within normal limits. The bifurcation is unremarkable. More prominent tortuosity is noted in the distal cervical left ICA without significant stenosis or evidence for FMD. Vertebral arteries:The right vertebral artery originates from the subclavian artery. There is some tortuosity of the right vertebral artery proximally and also at C5-6 on the right. There is no focal stenosis or evidence for  vascular injury within either vertebral artery in the neck. Skeleton: Reversal of the normal cervical lordosis is likely positional. No focal lytic or blastic lesions are present. The mandible is intact. Other neck: No focal mucosal or submucosal lesions are present. The thyroid is within normal limits. Reactive type lymph nodes are evident bilaterally. The most prominent lymph nodes are some a DVA there nodes bilaterally and. The salivary glands are unremarkable. CTA HEAD Anterior circulation: The internal carotid arteries are within normal limits from the high cervical segments through the ICA termini. The A1 and M1 segments are normal. Anterior communicating artery is patent. The MCA bifurcations are intact. The ACA and MCA branch vessels are within normal limits. Posterior circulation: The PICA origins are visualized and normal. The basilar artery is normal. A posterior cerebral arteries originate from the basilar tip. There is contribution from a left posterior communicating artery as well. The PCA branch vessels are within normal limits. Venous sinuses: The dural sinuses are patent. Straight sinus and deep cerebral veins are intact. Cortical veins are unremarkable. Anatomic variants: None Delayed phase: No pathologic enhancement is present. IMPRESSION: 1. Tortuosity of the distal cervical internal carotid arteries is advanced for age, left greater than right. There is no associated stenosis, vascular injury, or FMD. 2. Mild tortuosity in the vertebral arteries in the neck. 3. Normal CTA circle of Willis without evidence for significant proximal stenosis, aneurysm, or branch vessel occlusion. 4. Normal CT appearance of the brain. Electronically Signed   By: Marin Roberts M.D.   On: 02/10/2015 21:37   Dg Chest 2 View  02/10/2015  CLINICAL DATA:  Stroke symptoms for 1 day.  Hypertension. EXAM: CHEST  2 VIEW COMPARISON:  10/25/2014 FINDINGS: The heart size and mediastinal contours are within normal  limits. Both lungs are clear. The visualized skeletal structures are unremarkable. IMPRESSION: No active cardiopulmonary  disease. Electronically Signed   By: Ellery Plunk M.D.   On: 02/10/2015 22:54   Ct Head Wo Contrast  02/10/2015  CLINICAL DATA:  Weakness and numbness on left side which began this morning. History of lupus EXAM: CT HEAD WITHOUT CONTRAST TECHNIQUE: Contiguous axial images were obtained from the base of the skull through the vertex without intravenous contrast. COMPARISON:  None. FINDINGS: No acute cortical infarct, hemorrhage, or mass lesion ispresent. Ventricles are of normal size. No significant extra-axial fluid collection is present. The paranasal sinuses andmastoid air cells are clear. The osseous skull is intact. IMPRESSION: Negative exam. Electronically Signed   By: Signa Kell M.D.   On: 02/10/2015 10:34   Ct Angio Neck W/cm &/or Wo/cm  02/10/2015  CLINICAL DATA:  Bilateral upper and lower extremity weakness, fever, and neck pain. Dizziness. EXAM: CT ANGIOGRAPHY HEAD AND NECK TECHNIQUE: Multidetector CT imaging of the head and neck was performed using the standard protocol during bolus administration of intravenous contrast. Multiplanar CT image reconstructions and MIPs were obtained to evaluate the vascular anatomy. Carotid stenosis measurements (when applicable) are obtained utilizing NASCET criteria, using the distal internal carotid diameter as the denominator. CONTRAST:  25mL OMNIPAQUE IOHEXOL 350 MG/ML SOLN COMPARISON:  MRI brain and CT head without contrast from the same day. FINDINGS: CT HEAD Brain: No acute infarct, hemorrhage, or mass lesion is present. The ventricles are of normal size. No significant extraaxial fluid collection is present. Calvarium and skull base: Within normal limits Paranasal sinuses: Clear Orbits: Negative CTA NECK Aortic arch: The left vertebral artery originates from the aortic arch. A 4 vessel arch is present. There is no significant  atherosclerotic disease or stenosis. The upper mediastinum is unremarkable. Right carotid system: The right common carotid artery is within normal limits. The bifurcation is within normal limits. The cervical right ICA is normal. There is mild tortuosity of the high cervical right ICA just below the skullbase without significant stenosis. Left carotid system: The left common carotid artery is within normal limits. The bifurcation is unremarkable. More prominent tortuosity is noted in the distal cervical left ICA without significant stenosis or evidence for FMD. Vertebral arteries:The right vertebral artery originates from the subclavian artery. There is some tortuosity of the right vertebral artery proximally and also at C5-6 on the right. There is no focal stenosis or evidence for vascular injury within either vertebral artery in the neck. Skeleton: Reversal of the normal cervical lordosis is likely positional. No focal lytic or blastic lesions are present. The mandible is intact. Other neck: No focal mucosal or submucosal lesions are present. The thyroid is within normal limits. Reactive type lymph nodes are evident bilaterally. The most prominent lymph nodes are some a DVA there nodes bilaterally and. The salivary glands are unremarkable. CTA HEAD Anterior circulation: The internal carotid arteries are within normal limits from the high cervical segments through the ICA termini. The A1 and M1 segments are normal. Anterior communicating artery is patent. The MCA bifurcations are intact. The ACA and MCA branch vessels are within normal limits. Posterior circulation: The PICA origins are visualized and normal. The basilar artery is normal. A posterior cerebral arteries originate from the basilar tip. There is contribution from a left posterior communicating artery as well. The PCA branch vessels are within normal limits. Venous sinuses: The dural sinuses are patent. Straight sinus and deep cerebral veins are intact.  Cortical veins are unremarkable. Anatomic variants: None Delayed phase: No pathologic enhancement is present. IMPRESSION: 1. Tortuosity of  the distal cervical internal carotid arteries is advanced for age, left greater than right. There is no associated stenosis, vascular injury, or FMD. 2. Mild tortuosity in the vertebral arteries in the neck. 3. Normal CTA circle of Willis without evidence for significant proximal stenosis, aneurysm, or branch vessel occlusion. 4. Normal CT appearance of the brain. Electronically Signed   By: Marin Roberts M.D.   On: 02/10/2015 21:37   Mr Brain Wo Contrast  02/10/2015  CLINICAL DATA:  24 year old male with acute onset of right side body tingling, paresthesia yesterday at 1630 hours. Treated and released at [facility]. Today new onset blurred vision in the left eye, slurred speech. Initial encounter. EXAM: MRI HEAD WITHOUT CONTRAST TECHNIQUE: Multiplanar, multiecho pulse sequences of the brain and surrounding structures were obtained without intravenous contrast. COMPARISON:  Head CT without contrast 1032 hours today. FINDINGS: Cerebral volume is normal. No restricted diffusion to suggest acute infarction. No midline shift, mass effect, evidence of mass lesion, ventriculomegaly, extra-axial collection or acute intracranial hemorrhage. Cervicomedullary junction and pituitary are within normal limits. Major intracranial vascular flow voids are within normal limits. Wallace Cullens and white matter signal is within normal limits throughout the brain. No chronic cerebral blood products. Visible internal auditory structures appear normal. Mastoids are clear. Paranasal sinuses are clear. Orbit and scalp soft tissues appear normal. Negative visualized cervical spine. Visualized bone marrow signal is within normal limits. IMPRESSION: Normal noncontrast MRI appearance of the brain. Electronically Signed   By: Odessa Fleming M.D.   On: 02/10/2015 13:51    Micro Results      No  results found for this or any previous visit (from the past 240 hour(s)).     Today   Subjective    Dan Adams today has no headache,no chest abdominal pain,no new weakness tingling or numbness, feels much better wants to go home today.     Objective   Blood pressure 144/99, pulse 66, temperature 98.6 F (37 C), temperature source Oral, resp. rate 20, height  (1.651 m), weight 73.936 kg (163 lb), SpO2 100 %.   Intake/Output Summary (Last 24 hours) at 02/12/15 1146 Last data filed at 02/11/15 1300  Gross per 24 hour  Intake    360 ml  Output    200 ml  Net    160 ml    Exam Awake Alert, Oriented x 3, No new F.N deficits, Normal affect Dan Adams.AT,PERRAL Supple Neck,No JVD, No cervical lymphadenopathy appriciated.  Symmetrical Chest wall movement, Good air movement bilaterally, CTAB RRR,No Gallops,Rubs or new Murmurs, No Parasternal Heave +ve B.Sounds, Abd Soft, Non tender, No organomegaly appriciated, No rebound -guarding or rigidity. No Cyanosis, Clubbing or edema, No new Rash or bruise   Data Review   CBC w Diff: Lab Results  Component Value Date   WBC 2.4* 02/10/2015   WBC 2.3* 09/07/2013   HGB 11.1* 02/10/2015   HGB 15.3 09/07/2013   HCT 34.8* 02/10/2015   HCT 42.4 09/07/2013   PLT 181 02/10/2015   PLT 113 Platelet count consistent in citrate* 09/07/2013   LYMPHOPCT 23 02/10/2015   LYMPHOPCT 34.8 09/07/2013   BANDSPCT 3 12/26/2014   MONOPCT 8 02/10/2015   MONOPCT 10.4 09/07/2013   EOSPCT 0 02/10/2015   EOSPCT 0.4 09/07/2013   BASOPCT 0 02/10/2015   BASOPCT 0.0 09/07/2013    CMP: Lab Results  Component Value Date   NA 136 02/12/2015   K 3.7 02/12/2015   CL 101 02/12/2015   CO2 27 02/12/2015  BUN 14 02/12/2015   CREATININE 0.96 02/12/2015   PROT 7.5 08/13/2014   ALBUMIN 3.9 08/13/2014   BILITOT 0.6 08/13/2014   ALKPHOS 45 08/13/2014   AST 20 08/13/2014   ALT 21 08/13/2014  . Lab Results  Component Value Date   CHOL 158 02/10/2015    HDL 25* 02/10/2015   LDLCALC 103* 02/10/2015   TRIG 148 02/10/2015   CHOLHDL 6.3 02/10/2015    Lab Results  Component Value Date   HGBA1C 5.2 02/10/2015    Lab Results  Component Value Date   VITAMINB12 210 02/11/2015     Total Time in preparing paper work, data evaluation and todays exam - 35 minutes  Leroy Sea M.D on 02/12/2015 at 11:46 AM  [facility]   Office  [phone]

## 2015-02-16 LAB — METHYLMALONIC ACID, SERUM: Methylmalonic Acid, Quantitative: 132 nmol/L (ref 0–378)

## 2015-02-16 LAB — ANTIPHOSPHOLIPID SYNDROME EVAL, BLD
Anticardiolipin IgA: <9 APL U/mL (ref 0–11)
Anticardiolipin IgG: <9 GPL U/mL (ref 0–14)
Anticardiolipin IgM: <9 MPL U/mL (ref 0–12)
DRVVT: 40.9 s (ref 0.0–44.0)
PHOSPHATYDALSERINE, IGA: 3 {APS'U} (ref 0–20)
PHOSPHATYDALSERINE, IGG: 10 {GPS'U} (ref 0–11)
PHOSPHATYDALSERINE, IGM: 10 {MPS'U} (ref 0–25)
PTT LA: 31.5 s (ref 0.0–40.6)

## 2015-02-22 ENCOUNTER — Inpatient Hospital Stay: Payer: 59 | Admitting: Internal Medicine

## 2015-02-22 DIAGNOSIS — Z0289 Encounter for other administrative examinations: Secondary | ICD-10-CM

## 2015-02-28 ENCOUNTER — Telehealth: Payer: Self-pay | Admitting: Internal Medicine

## 2015-02-28 ENCOUNTER — Ambulatory Visit (INDEPENDENT_AMBULATORY_CARE_PROVIDER_SITE_OTHER): Payer: 59 | Admitting: Neurology

## 2015-02-28 ENCOUNTER — Encounter: Payer: Self-pay | Admitting: Neurology

## 2015-02-28 VITALS — BP 135/83 | HR 90 | Ht 65.0 in | Wt 159.2 lb

## 2015-02-28 DIAGNOSIS — G459 Transient cerebral ischemic attack, unspecified: Secondary | ICD-10-CM | POA: Diagnosis not present

## 2015-02-28 NOTE — Telephone Encounter (Signed)
Patient has appointment tomorrow 11/21 at [facility] Dr. Christell Constant at 2:45.  Patient needs [facility] referral.

## 2015-02-28 NOTE — Progress Notes (Signed)
[facility] [address] [place]. [place] [address] [phone]       OFFICE FOLLOW-UP NOTE  Dan Adams Date of Birth:  1990-05-22 Medical Record Number:  [id number]   HPI: Mr Dan Adams is a 72 year African-American male seen today for first office follow-up following hospital consultation for possible TIA.  Dan Adams is a 24 y.o. male with a history of lupus, hypertension who presents with 3 episodes over the past 24 hours prior to presentation of numbness/weakness. On11/05/2014 he had right arm and leg and face numbness that lasted for approximately 20 minutes and he went to [facility] and was discharged home. Next day 02/10/2015, he  had 2 episodes. He initially had difficulty holding things with his left hand and slight slurred speech that lasted for approximately an hour. This resolved, however then later he had recurrence of symptoms lasting for approximately 20 minutes but there are more severe. He had slurred speech and weakness/numbness of his arm and leg. He has had worsening headaches over the past 1.5 weeks. He does have a history of headaches, but the ones over the past 1.5 weeks have been increasing in severity. He does not have any headache today, and did not have a headache with these events. He was LKW 02/09/2015. Patient was not administered TPA secondary to resolved symptoms. He was admitted for further evaluation and treatment. CT scan of the head and CT angiogram of the head and neck were unremarkable. MRI scan of the brain showed no acute abnormality. Transthoracic echo showed normal ejection fraction. Carotid ultrasound showed no significant extracranial stenosis. Hemoglobin A1c was normal at 5.2. LDL cholesterol was borderline at 103 mg percent. Patient was started on aspirin 81 mg as well as statin. Patient states his done well since discharge and he is not had any recurrent numbness episodes at or any headaches. He has started applying for jobs  and does go to [employer] now. He did admit during his hospitalization that he had lost his job and was under a lot of psychological stress at the time of his symptoms. He does have a history of lupus and migraines but they seem well controlled. His blood pressure has also been well controlled.  ROS:   14 system review of systems is positive for fever and chills weight loss fatigue, hearing loss, ringing in the ears, trouble swallowing, rash, shortness of breath, cough, anemia, feeling hot and cold, joint pain, cramps, aching muscles, headache, weakness, insomnia, anxiety, not enough sleep, decreased energy, racing thoughts and all other systems negative  PMH:  Past Medical History  Diagnosis Date  . Leukopenia 09/07/2013  . Thrombocytopenia, unspecified (HCC) 09/07/2013  . Systemic lupus (HCC)   . Hypertension   . Childhood asthma   . Migraine     "@ least 2-3 times/wk now" (02/10/2015)  . RA (rheumatoid arthritis) (HCC)     "feet, fingers, knees; it's from my lupus" (02/10/2015)  . Chronic back pain   . Anxiety   . Depression   . Stroke-like symptoms 02/09/2015-02/10/2015    "right-left"    Social History:  Social History   Social History  . Marital Status: Single    Spouse Name: N/A  . Number of Children: N/A  . Years of Education: N/A   Occupational History  . Not on file.   Social History Main Topics  . Smoking status: Former Smoker -- 2.00 packs/day for 3 years    Types: Cigars, Cigarettes    Start date:  04/09/2010    Quit date: 01/06/2014  . Smokeless tobacco: Never Used     Comment: 02/10/2015 "quit smoking over 1 yr ago'  . Alcohol Use: No  . Drug Use: No  . Sexual Activity: Yes   Other Topics Concern  . Not on file   Social History Narrative    Medications:   Current Outpatient Prescriptions on File Prior to Visit  Medication Sig Dispense Refill  . amLODipine (NORVASC) 10 MG tablet Take 1 tablet (10 mg total) by mouth daily. 30 tablet 0  . aspirin EC 81 MG EC  tablet Take 1 tablet (81 mg total) by mouth daily. 30 tablet 0  . atorvastatin (LIPITOR) 40 MG tablet Take 1 tablet (40 mg total) by mouth daily at 6 PM. 30 tablet 0  . Cholecalciferol (VITAMIN D3) 2000 UNITS capsule Take 1 capsule (2,000 Units total) by mouth daily. 100 capsule 3  . Cyanocobalamin (VITAMIN B-12) 1000 MCG SUBL Place 1 tablet (1,000 mcg total) under the tongue daily. 100 tablet 3  . HYDROXYCHLOROQUINE SULFATE PO Take 200 mg by mouth every morning. 2 tabs in am = 400 mg    . losartan (COZAAR) 100 MG tablet Take 1 tablet (100 mg total) by mouth daily. 30 tablet 11  . predniSONE (DELTASONE) 10 MG tablet 5 mg po qd pc 120 tablet 1   No current facility-administered medications on file prior to visit.    Allergies:   Allergies  Allergen Reactions  . Codeine Nausea And Vomiting    Pt thinks he is allergic to codeine--  . Promethazine Nausea And Vomiting    Physical Exam General: well developed, well nourished, seated, in no evident distress Head: head normocephalic and atraumatic.  Neck: supple with no carotid or supraclavicular bruits Cardiovascular: regular rate and rhythm, no murmurs Musculoskeletal: no deformity Skin:  no rash/petichiae. Mild erythema of cheeks bilaterally Vascular:  Normal pulses all extremities Filed Vitals:   02/28/15 1000  BP: 135/83  Pulse: 90   Neurologic Exam Mental Status: Awake and fully alert. Oriented to place and time. Recent and remote memory intact. Attention span, concentration and fund of knowledge appropriate. Mood and affect appropriate.  Cranial Nerves: Fundoscopic exam reveals sharp disc margins. Pupils equal, briskly reactive to light. Extraocular movements full without nystagmus. Visual fields full to confrontation. Hearing diminished on the right side.. Facial sensation intact. Face, tongue, palate moves normally and symmetrically.  Motor: Normal bulk and tone. Normal strength in all tested extremity muscles. Sensory.: intact  to touch ,pinprick .position and vibratory sensation.  Coordination: Rapid alternating movements normal in all extremities. Finger-to-nose and heel-to-shin performed accurately bilaterally. Gait and Station: Arises from chair without difficulty. Stance is normal. Gait demonstrates normal stride length and balance . Able to heel, toe and tandem walk without difficulty.  Reflexes: 1+ and symmetric. Toes downgoing.   NIHSS  0 Modified Rankin  0   ASSESSMENT: 24 year old African-American male with 3 transient episodes of recurrent right arm and leg numbness secondary by headache possible atypical migraine versus anxiety/stress stress-related episode. Normal imaging studies and neurovascular workup. Long-standing history of lupus, hypertension and migraines all of which seem stable    PLAN: I had a long d/w patient and his sister about his recent TIA like episode, risk for recurrent stroke/TIAs, personally independently reviewed imaging studies and stroke evaluation results and answered questions.Continue aspirin 81 mg daily  for secondary stroke prevention and maintain strict control of hypertension with blood pressure goal below 130/90, and lipids with  LDL cholesterol goal below 100 mg/dL. I also advised the patient that perhaps underlying psychosocial factors and stress may have played a role in his symptoms and to increase participation in regular activities for stress relaxation-like exercise, meditation and yoga. He was advised to see his primary physician for his right ear wax and decreased hearing. Greater than 50% of time during this 25 minute visit was spent on counseling,explanation of diagnosis, planning of further management, discussion with patient and family and coordination of care No routine follow-up appointment is necessary with me.  Delia Heady, MD Note: This document was prepared with digital dictation and possible smart phrase technology. Any transcriptional errors that result  from this process are unintentional

## 2015-02-28 NOTE — Patient Instructions (Signed)
I had a long d/w patient about his recent TIA like episode, risk for recurrent stroke/TIAs, personally independently reviewed imaging studies and stroke evaluation results and answered questions.Continue aspirin 81 mg daily  for secondary stroke prevention and maintain strict control of hypertension with blood pressure goal below 130/90, and lipids with LDL cholesterol goal below 100 mg/dL. I also advised the patient that perhaps underlying psychosocial factors and stress may have played a role in his symptoms and to increase participation in regular activities for stress relaxation-like exercise, meditation and yoga. He was advised to see his primary physician for his right ear wax and decreased hearing. No routine follow-up appointment is necessary with me.

## 2015-03-01 NOTE — Telephone Encounter (Signed)
[employer] ref # [id number] valid 03/01/15-08/29/15 for 6 visits

## 2015-03-02 ENCOUNTER — Encounter: Payer: Self-pay | Admitting: Internal Medicine

## 2015-03-02 ENCOUNTER — Ambulatory Visit (INDEPENDENT_AMBULATORY_CARE_PROVIDER_SITE_OTHER): Payer: 59 | Admitting: Internal Medicine

## 2015-03-02 VITALS — BP 150/84 | HR 104 | Wt 157.0 lb

## 2015-03-02 DIAGNOSIS — Z9119 Patient's noncompliance with other medical treatment and regimen: Secondary | ICD-10-CM

## 2015-03-02 DIAGNOSIS — Z91199 Patient's noncompliance with other medical treatment and regimen due to unspecified reason: Secondary | ICD-10-CM

## 2015-03-02 DIAGNOSIS — E538 Deficiency of other specified B group vitamins: Secondary | ICD-10-CM

## 2015-03-02 DIAGNOSIS — E559 Vitamin D deficiency, unspecified: Secondary | ICD-10-CM

## 2015-03-02 DIAGNOSIS — F411 Generalized anxiety disorder: Secondary | ICD-10-CM | POA: Insufficient documentation

## 2015-03-02 DIAGNOSIS — R2990 Unspecified symptoms and signs involving the nervous system: Secondary | ICD-10-CM | POA: Diagnosis not present

## 2015-03-02 MED ORDER — CETAPHIL MOISTURIZING EX LOTN: 1.0000 "application " | TOPICAL_LOTION | Freq: Two times a day (BID) | CUTANEOUS | Status: DC

## 2015-03-02 MED ORDER — TRIAMCINOLONE ACETONIDE 0.5 % EX OINT: 1.0000 "application " | TOPICAL_OINTMENT | Freq: Two times a day (BID) | CUTANEOUS | Status: DC

## 2015-03-02 MED ORDER — CYANOCOBALAMIN 1000 MCG/ML IJ SOLN: 1000.0000 ug | INTRAMUSCULAR | Status: DC

## 2015-03-02 MED ORDER — ESCITALOPRAM OXALATE 5 MG PO TABS: 5.0000 mg | ORAL_TABLET | Freq: Every day | ORAL | Status: DC

## 2015-03-02 MED ORDER — ACYCLOVIR 400 MG PO TABS: 400.0000 mg | ORAL_TABLET | Freq: Three times a day (TID) | ORAL | Status: DC

## 2015-03-02 MED ORDER — CYANOCOBALAMIN 1000 MCG/ML IJ SOLN
1000.0000 ug | Freq: Once | INTRAMUSCULAR | Status: AC
  Administered 2015-03-02: 1000 ug via INTRAMUSCULAR

## 2015-03-02 MED ORDER — "SYRINGE/NEEDLE (DISP) 30G X 1/2"" 1 ML MISC": 1.0000 | Status: DC

## 2015-03-02 NOTE — Progress Notes (Signed)
Pre visit review using our clinic review tool, if applicable. No additional management support is needed unless otherwise documented below in the visit note. 

## 2015-03-02 NOTE — Progress Notes (Signed)
Subjective:  Patient ID: Dan Adams, male    DOB: 1990-06-22  Age: 24 y.o. MRN: [id number]  CC: No chief complaint on file.   HPI Rambo Janak presents for a TIA, B12 def, HTN, lupus f/u. Pt had lip rash again - mild. C/o anxiety   Outpatient Prescriptions Prior to Visit  Medication Sig Dispense Refill  . amLODipine (NORVASC) 10 MG tablet Take 1 tablet (10 mg total) by mouth daily. 30 tablet 0  . aspirin EC 81 MG EC tablet Take 1 tablet (81 mg total) by mouth daily. 30 tablet 0  . atorvastatin (LIPITOR) 40 MG tablet Take 1 tablet (40 mg total) by mouth daily at 6 PM. 30 tablet 0  . Cholecalciferol (VITAMIN D3) 2000 UNITS capsule Take 1 capsule (2,000 Units total) by mouth daily. 100 capsule 3  . HYDROXYCHLOROQUINE SULFATE PO Take 200 mg by mouth every morning. 2 tabs in am = 400 mg    . losartan (COZAAR) 100 MG tablet Take 1 tablet (100 mg total) by mouth daily. 30 tablet 11  . predniSONE (DELTASONE) 10 MG tablet 5 mg po qd pc 120 tablet 1  . Cyanocobalamin (VITAMIN B-12) 1000 MCG SUBL Place 1 tablet (1,000 mcg total) under the tongue daily. 100 tablet 3   No facility-administered medications prior to visit.    ROS Review of Systems  Constitutional: Negative for appetite change, fatigue and unexpected weight change.  HENT: Negative for congestion, nosebleeds, sneezing, sore throat and trouble swallowing.   Eyes: Negative for itching and visual disturbance.  Respiratory: Negative for cough.   Cardiovascular: Negative for chest pain, palpitations and leg swelling.  Gastrointestinal: Negative for nausea, diarrhea, blood in stool and abdominal distention.  Genitourinary: Negative for frequency and hematuria.  Musculoskeletal: Positive for arthralgias. Negative for back pain, joint swelling, gait problem and neck pain.  Skin: Positive for rash.  Neurological: Negative for dizziness, tremors, speech difficulty and weakness.  Psychiatric/Behavioral: Positive for decreased  concentration. Negative for suicidal ideas, sleep disturbance, dysphoric mood and agitation. The patient is not nervous/anxious.     Objective:  BP 150/84 mmHg  Pulse 104  Wt 157 lb (71.215 kg)  SpO2 97%  BP Readings from Last 3 Encounters:  03/02/15 150/84  02/28/15 135/83  02/12/15 144/99    Wt Readings from Last 3 Encounters:  03/02/15 157 lb (71.215 kg)  02/28/15 159 lb 3.2 oz (72.213 kg)  02/10/15 163 lb (73.936 kg)    Physical Exam  Constitutional: He is oriented to person, place, and time. He appears well-developed. No distress.  NAD  HENT:  Mouth/Throat: Oropharynx is clear and moist.  Eyes: Conjunctivae are normal. Pupils are equal, round, and reactive to light.  Neck: Normal range of motion. No JVD present. No thyromegaly present.  Cardiovascular: Normal rate, regular rhythm, normal heart sounds and intact distal pulses.  Exam reveals no gallop and no friction rub.   No murmur heard. Pulmonary/Chest: Effort normal and breath sounds normal. No respiratory distress. He has no wheezes. He has no rales. He exhibits no tenderness.  Abdominal: Soft. Bowel sounds are normal. He exhibits no distension and no mass. There is no tenderness. There is no rebound and no guarding.  Musculoskeletal: Normal range of motion. He exhibits no edema or tenderness.  Lymphadenopathy:    He has no cervical adenopathy.  Neurological: He is alert and oriented to person, place, and time. He has normal reflexes. No cranial nerve deficit. He exhibits normal muscle tone. He displays a negative  Romberg sign. Coordination and gait normal.  Skin: Skin is warm and dry. No rash noted.  Psychiatric: He has a normal mood and affect. His behavior is normal. Judgment and thought content normal.    Lab Results  Component Value Date   WBC 2.4* 02/10/2015   HGB 11.1* 02/10/2015   HCT 34.8* 02/10/2015   PLT 181 02/10/2015   GLUCOSE 90 02/12/2015   CHOL 158 02/10/2015   TRIG 148 02/10/2015   HDL 25*  02/10/2015   LDLCALC 103* 02/10/2015   ALT 21 08/13/2014   AST 20 08/13/2014   NA 136 02/12/2015   K 3.7 02/12/2015   CL 101 02/12/2015   CREATININE 0.96 02/12/2015   BUN 14 02/12/2015   CO2 27 02/12/2015   TSH 1.13 08/13/2014   HGBA1C 5.2 02/10/2015    Ct Angio Head W/cm &/or Wo Cm  02/10/2015  CLINICAL DATA:  Bilateral upper and lower extremity weakness, fever, and neck pain. Dizziness. EXAM: CT ANGIOGRAPHY HEAD AND NECK TECHNIQUE: Multidetector CT imaging of the head and neck was performed using the standard protocol during bolus administration of intravenous contrast. Multiplanar CT image reconstructions and MIPs were obtained to evaluate the vascular anatomy. Carotid stenosis measurements (when applicable) are obtained utilizing NASCET criteria, using the distal internal carotid diameter as the denominator. CONTRAST:  22mL OMNIPAQUE IOHEXOL 350 MG/ML SOLN COMPARISON:  MRI brain and CT head without contrast from the same day. FINDINGS: CT HEAD Brain: No acute infarct, hemorrhage, or mass lesion is present. The ventricles are of normal size. No significant extraaxial fluid collection is present. Calvarium and skull base: Within normal limits Paranasal sinuses: Clear Orbits: Negative CTA NECK Aortic arch: The left vertebral artery originates from the aortic arch. A 4 vessel arch is present. There is no significant atherosclerotic disease or stenosis. The upper mediastinum is unremarkable. Right carotid system: The right common carotid artery is within normal limits. The bifurcation is within normal limits. The cervical right ICA is normal. There is mild tortuosity of the high cervical right ICA just below the skullbase without significant stenosis. Left carotid system: The left common carotid artery is within normal limits. The bifurcation is unremarkable. More prominent tortuosity is noted in the distal cervical left ICA without significant stenosis or evidence for FMD. Vertebral arteries:The right  vertebral artery originates from the subclavian artery. There is some tortuosity of the right vertebral artery proximally and also at C5-6 on the right. There is no focal stenosis or evidence for vascular injury within either vertebral artery in the neck. Skeleton: Reversal of the normal cervical lordosis is likely positional. No focal lytic or blastic lesions are present. The mandible is intact. Other neck: No focal mucosal or submucosal lesions are present. The thyroid is within normal limits. Reactive type lymph nodes are evident bilaterally. The most prominent lymph nodes are some a DVA there nodes bilaterally and. The salivary glands are unremarkable. CTA HEAD Anterior circulation: The internal carotid arteries are within normal limits from the high cervical segments through the ICA termini. The A1 and M1 segments are normal. Anterior communicating artery is patent. The MCA bifurcations are intact. The ACA and MCA branch vessels are within normal limits. Posterior circulation: The PICA origins are visualized and normal. The basilar artery is normal. A posterior cerebral arteries originate from the basilar tip. There is contribution from a left posterior communicating artery as well. The PCA branch vessels are within normal limits. Venous sinuses: The dural sinuses are patent. Straight sinus and deep  cerebral veins are intact. Cortical veins are unremarkable. Anatomic variants: None Delayed phase: No pathologic enhancement is present. IMPRESSION: 1. Tortuosity of the distal cervical internal carotid arteries is advanced for age, left greater than right. There is no associated stenosis, vascular injury, or FMD. 2. Mild tortuosity in the vertebral arteries in the neck. 3. Normal CTA circle of Willis without evidence for significant proximal stenosis, aneurysm, or branch vessel occlusion. 4. Normal CT appearance of the brain. Electronically Signed   By: Marin Roberts M.D.   On: 02/10/2015 21:37   Dg Chest  2 View  02/10/2015  CLINICAL DATA:  Stroke symptoms for 1 day.  Hypertension. EXAM: CHEST  2 VIEW COMPARISON:  10/25/2014 FINDINGS: The heart size and mediastinal contours are within normal limits. Both lungs are clear. The visualized skeletal structures are unremarkable. IMPRESSION: No active cardiopulmonary disease. Electronically Signed   By: Ellery Plunk M.D.   On: 02/10/2015 22:54   Ct Head Wo Contrast  02/10/2015  CLINICAL DATA:  Weakness and numbness on left side which began this morning. History of lupus EXAM: CT HEAD WITHOUT CONTRAST TECHNIQUE: Contiguous axial images were obtained from the base of the skull through the vertex without intravenous contrast. COMPARISON:  None. FINDINGS: No acute cortical infarct, hemorrhage, or mass lesion ispresent. Ventricles are of normal size. No significant extra-axial fluid collection is present. The paranasal sinuses andmastoid air cells are clear. The osseous skull is intact. IMPRESSION: Negative exam. Electronically Signed   By: Signa Kell M.D.   On: 02/10/2015 10:34   Ct Angio Neck W/cm &/or Wo/cm  02/10/2015  CLINICAL DATA:  Bilateral upper and lower extremity weakness, fever, and neck pain. Dizziness. EXAM: CT ANGIOGRAPHY HEAD AND NECK TECHNIQUE: Multidetector CT imaging of the head and neck was performed using the standard protocol during bolus administration of intravenous contrast. Multiplanar CT image reconstructions and MIPs were obtained to evaluate the vascular anatomy. Carotid stenosis measurements (when applicable) are obtained utilizing NASCET criteria, using the distal internal carotid diameter as the denominator. CONTRAST:  47mL OMNIPAQUE IOHEXOL 350 MG/ML SOLN COMPARISON:  MRI brain and CT head without contrast from the same day. FINDINGS: CT HEAD Brain: No acute infarct, hemorrhage, or mass lesion is present. The ventricles are of normal size. No significant extraaxial fluid collection is present. Calvarium and skull base: Within  normal limits Paranasal sinuses: Clear Orbits: Negative CTA NECK Aortic arch: The left vertebral artery originates from the aortic arch. A 4 vessel arch is present. There is no significant atherosclerotic disease or stenosis. The upper mediastinum is unremarkable. Right carotid system: The right common carotid artery is within normal limits. The bifurcation is within normal limits. The cervical right ICA is normal. There is mild tortuosity of the high cervical right ICA just below the skullbase without significant stenosis. Left carotid system: The left common carotid artery is within normal limits. The bifurcation is unremarkable. More prominent tortuosity is noted in the distal cervical left ICA without significant stenosis or evidence for FMD. Vertebral arteries:The right vertebral artery originates from the subclavian artery. There is some tortuosity of the right vertebral artery proximally and also at C5-6 on the right. There is no focal stenosis or evidence for vascular injury within either vertebral artery in the neck. Skeleton: Reversal of the normal cervical lordosis is likely positional. No focal lytic or blastic lesions are present. The mandible is intact. Other neck: No focal mucosal or submucosal lesions are present. The thyroid is within normal limits. Reactive type lymph  nodes are evident bilaterally. The most prominent lymph nodes are some a DVA there nodes bilaterally and. The salivary glands are unremarkable. CTA HEAD Anterior circulation: The internal carotid arteries are within normal limits from the high cervical segments through the ICA termini. The A1 and M1 segments are normal. Anterior communicating artery is patent. The MCA bifurcations are intact. The ACA and MCA branch vessels are within normal limits. Posterior circulation: The PICA origins are visualized and normal. The basilar artery is normal. A posterior cerebral arteries originate from the basilar tip. There is contribution from a  left posterior communicating artery as well. The PCA branch vessels are within normal limits. Venous sinuses: The dural sinuses are patent. Straight sinus and deep cerebral veins are intact. Cortical veins are unremarkable. Anatomic variants: None Delayed phase: No pathologic enhancement is present. IMPRESSION: 1. Tortuosity of the distal cervical internal carotid arteries is advanced for age, left greater than right. There is no associated stenosis, vascular injury, or FMD. 2. Mild tortuosity in the vertebral arteries in the neck. 3. Normal CTA circle of Willis without evidence for significant proximal stenosis, aneurysm, or branch vessel occlusion. 4. Normal CT appearance of the brain. Electronically Signed   By: Marin Roberts M.D.   On: 02/10/2015 21:37   Mr Brain Wo Contrast  02/10/2015  CLINICAL DATA:  24 year old male with acute onset of right side body tingling, paresthesia yesterday at 1630 hours. Treated and released at [facility]. Today new onset blurred vision in the left eye, slurred speech. Initial encounter. EXAM: MRI HEAD WITHOUT CONTRAST TECHNIQUE: Multiplanar, multiecho pulse sequences of the brain and surrounding structures were obtained without intravenous contrast. COMPARISON:  Head CT without contrast 1032 hours today. FINDINGS: Cerebral volume is normal. No restricted diffusion to suggest acute infarction. No midline shift, mass effect, evidence of mass lesion, ventriculomegaly, extra-axial collection or acute intracranial hemorrhage. Cervicomedullary junction and pituitary are within normal limits. Major intracranial vascular flow voids are within normal limits. Wallace Cullens and white matter signal is within normal limits throughout the brain. No chronic cerebral blood products. Visible internal auditory structures appear normal. Mastoids are clear. Paranasal sinuses are clear. Orbit and scalp soft tissues appear normal. Negative visualized cervical spine. Visualized bone marrow  signal is within normal limits. IMPRESSION: Normal noncontrast MRI appearance of the brain. Electronically Signed   By: Odessa Fleming M.D.   On: 02/10/2015 13:51    Assessment & Plan:   Diagnoses and all orders for this visit:  Generalized anxiety disorder  Non compliance with medical treatment  Stroke-like symptoms  Vitamin D deficiency  Vitamin B12 deficiency -     cyanocobalamin ((VITAMIN B-12)) injection 1,000 mcg; Inject 1 mL (1,000 mcg total) into the muscle once.  Other orders -     cyanocobalamin (COBAL-1000) 1000 MCG/ML injection; Inject 1 mL (1,000 mcg total) into the muscle every 14 (fourteen) days. -     Syringe/Needle, Disp, (B-D ECLIPSE SYRINGE) 30G X 1/2" 1 ML MISC; 1 each by Does not apply route 1 day or 1 dose. For B12 injections -     escitalopram (LEXAPRO) 5 MG tablet; Take 1 tablet (5 mg total) by mouth daily. -     cetaphil (CETAPHIL) lotion; Apply 1 application topically 2 (two) times daily. -     triamcinolone ointment (KENALOG) 0.5 %; Apply 1 application topically 2 (two) times daily. -     acyclovir (ZOVIRAX) 400 MG tablet; Take 1 tablet (400 mg total) by mouth 3 (three) times daily.  I have discontinued Mr. Poynor's Vitamin B-12. I am also having him start on cyanocobalamin, Syringe/Needle (Disp), escitalopram, cetaphil, and triamcinolone ointment. Additionally, I am having him maintain his HYDROXYCHLOROQUINE SULFATE PO, Vitamin D3, predniSONE, losartan, aspirin, atorvastatin, amLODipine, and acyclovir. We administered cyanocobalamin.  Meds ordered this encounter  Medications  . cyanocobalamin (COBAL-1000) 1000 MCG/ML injection    Sig: Inject 1 mL (1,000 mcg total) into the muscle every 14 (fourteen) days.    Dispense:  10 mL    Refill:  6  . Syringe/Needle, Disp, (B-D ECLIPSE SYRINGE) 30G X 1/2" 1 ML MISC    Sig: 1 each by Does not apply route 1 day or 1 dose. For B12 injections    Dispense:  50 each    Refill:  11  . escitalopram (LEXAPRO) 5 MG tablet      Sig: Take 1 tablet (5 mg total) by mouth daily.    Dispense:  30 tablet    Refill:  5  . cetaphil (CETAPHIL) lotion    Sig: Apply 1 application topically 2 (two) times daily.    Dispense:  236 mL    Refill:  11  . triamcinolone ointment (KENALOG) 0.5 %    Sig: Apply 1 application topically 2 (two) times daily.    Dispense:  45 g    Refill:  3  . acyclovir (ZOVIRAX) 400 MG tablet    Sig: Take 1 tablet (400 mg total) by mouth 3 (three) times daily.    Dispense:  21 tablet    Refill:  3  . cyanocobalamin ((VITAMIN B-12)) injection 1,000 mcg    Sig:      Follow-up: Return in about 2 months (around 05/02/2015) for a follow-up visit.  Sonda Primes, MD

## 2015-03-02 NOTE — Assessment & Plan Note (Signed)
resolved 

## 2015-03-02 NOTE — Assessment & Plan Note (Signed)
Start Lexapro - low dose 

## 2015-03-02 NOTE — Assessment & Plan Note (Signed)
Vit D 

## 2015-03-02 NOTE — Assessment & Plan Note (Signed)
Discussed again 

## 2015-03-06 ENCOUNTER — Encounter: Payer: Self-pay | Admitting: Internal Medicine

## 2015-04-18 ENCOUNTER — Ambulatory Visit: Payer: 59 | Admitting: Internal Medicine

## 2015-04-22 ENCOUNTER — Telehealth: Payer: Self-pay | Admitting: Internal Medicine

## 2015-04-22 ENCOUNTER — Telehealth: Payer: Self-pay

## 2015-04-22 NOTE — Telephone Encounter (Signed)
Triage nurse called and stated that patient was having double vision, vomiting and SOB. Patient has a history of lupus and the mother wanted him to be seen with [facility] today. We advised her that we have no openings and that he would need to go to the ER. Patients mother declined then reinstated that she would take him to the ER. Aware to call us if she has any further concerns or questions.

## 2015-04-22 NOTE — Telephone Encounter (Signed)
Patient Name: Dan Adams DOB: 1990/09/28 Initial Comment Caller states 26 yr old son is vomiting and one eye crossing; last urinated an hour ago; lupus pt; Nurse Assessment Nurse: Charna Elizabeth, RN, Lynden Ang Date/Time (Eastern Time): 04/22/2015 11:40:11 AM Confirm and document reason for call. If symptomatic, describe symptoms. You must click the next button to save text entered. ---Mother states [name] has had about 3 episodes of vomiting since night and his vision has become blurred today with his eyes crossing this morning. He developed shortness of breath this morning. No injury in the past 3 days. No fever. Alert and responsive. Has the patient traveled out of the country within the last 30 days? ---No Does the patient have any new or worsening symptoms? ---Yes Will a triage be completed? ---Yes Related visit to physician within the last 2 weeks? ---No Does the PT have any chronic conditions? (i.e. diabetes, asthma, etc.) ---Yes List chronic conditions. ---Lupus Is this a behavioral health or substance abuse call? ---No Guidelines Guideline Title Affirmed Question Affirmed Notes Breathing Difficulty [1] MILD difficulty breathing (e.g., minimal/no SOB at rest, SOB with walking, pulse <100) AND [2] NEW-onset or WORSE than normal Vision Loss or Change Double vision Final Disposition User Go to ED Now (or PCP triage) Charna Elizabeth, RN, Lynden Ang Comments After triage completed the mother declined the Go to ER disposition. Reinforced the Go to ER disposition. Called the office backline and notified Lottie Dawson who will give further direction from MD. Referrals [facility] - ED Disagree/Comply: Disagree

## 2015-04-29 ENCOUNTER — Ambulatory Visit (INDEPENDENT_AMBULATORY_CARE_PROVIDER_SITE_OTHER): Payer: BLUE CROSS/BLUE SHIELD | Admitting: Internal Medicine

## 2015-04-29 ENCOUNTER — Encounter: Payer: Self-pay | Admitting: Internal Medicine

## 2015-04-29 ENCOUNTER — Other Ambulatory Visit (INDEPENDENT_AMBULATORY_CARE_PROVIDER_SITE_OTHER): Payer: BLUE CROSS/BLUE SHIELD

## 2015-04-29 VITALS — BP 110/70 | HR 102 | Temp 99.1°F | Wt 141.0 lb

## 2015-04-29 DIAGNOSIS — E559 Vitamin D deficiency, unspecified: Secondary | ICD-10-CM

## 2015-04-29 DIAGNOSIS — E538 Deficiency of other specified B group vitamins: Secondary | ICD-10-CM

## 2015-04-29 DIAGNOSIS — K219 Gastro-esophageal reflux disease without esophagitis: Secondary | ICD-10-CM

## 2015-04-29 DIAGNOSIS — F411 Generalized anxiety disorder: Secondary | ICD-10-CM

## 2015-04-29 DIAGNOSIS — K5900 Constipation, unspecified: Secondary | ICD-10-CM | POA: Diagnosis not present

## 2015-04-29 DIAGNOSIS — I159 Secondary hypertension, unspecified: Secondary | ICD-10-CM

## 2015-04-29 DIAGNOSIS — R509 Fever, unspecified: Secondary | ICD-10-CM

## 2015-04-29 DIAGNOSIS — D72819 Decreased white blood cell count, unspecified: Secondary | ICD-10-CM

## 2015-04-29 LAB — HEPATIC FUNCTION PANEL
ALK PHOS: 36 U/L — AB (ref 39–117)
ALT: 20 U/L (ref 0–53)
AST: 40 U/L — ABNORMAL HIGH (ref 0–37)
Albumin: 3.5 g/dL (ref 3.5–5.2)
BILIRUBIN DIRECT: 0.1 mg/dL (ref 0.0–0.3)
TOTAL PROTEIN: 7.7 g/dL (ref 6.0–8.3)
Total Bilirubin: 0.5 mg/dL (ref 0.2–1.2)

## 2015-04-29 LAB — CBC WITH DIFFERENTIAL/PLATELET
BASOS ABS: 0 10*3/uL (ref 0.0–0.1)
Basophils Relative: 0.9 % (ref 0.0–3.0)
EOS ABS: 0 10*3/uL (ref 0.0–0.7)
Eosinophils Relative: 0.1 % (ref 0.0–5.0)
HCT: 36 % — ABNORMAL LOW (ref 39.0–52.0)
Hemoglobin: 12.1 g/dL — ABNORMAL LOW (ref 13.0–17.0)
LYMPHS ABS: 0.5 10*3/uL — AB (ref 0.7–4.0)
Lymphocytes Relative: 32.7 % (ref 12.0–46.0)
MCHC: 33.6 g/dL (ref 30.0–36.0)
MCV: 85.4 fl (ref 78.0–100.0)
MONO ABS: 0.1 10*3/uL (ref 0.1–1.0)
Monocytes Relative: 7.1 % (ref 3.0–12.0)
NEUTROS PCT: 59.2 % (ref 43.0–77.0)
Neutro Abs: 0.9 10*3/uL — ABNORMAL LOW (ref 1.4–7.7)
Platelets: 206 10*3/uL (ref 150.0–400.0)
RBC: 4.21 Mil/uL — AB (ref 4.22–5.81)
RDW: 13 % (ref 11.5–15.5)
WBC: 1.5 10*3/uL — CL (ref 4.0–10.5)

## 2015-04-29 LAB — URINALYSIS, ROUTINE W REFLEX MICROSCOPIC
BILIRUBIN URINE: NEGATIVE
Ketones, ur: NEGATIVE
Leukocytes, UA: NEGATIVE
NITRITE: NEGATIVE
PH: 6 (ref 5.0–8.0)
Specific Gravity, Urine: 1.025 (ref 1.000–1.030)
TOTAL PROTEIN, URINE-UPE24: 100 — AB
Urine Glucose: NEGATIVE
Urobilinogen, UA: 0.2 (ref 0.0–1.0)

## 2015-04-29 LAB — BASIC METABOLIC PANEL
BUN: 21 mg/dL (ref 6–23)
CALCIUM: 9.3 mg/dL (ref 8.4–10.5)
CHLORIDE: 99 meq/L (ref 96–112)
CO2: 28 meq/L (ref 19–32)
Creatinine, Ser: 1.01 mg/dL (ref 0.40–1.50)
GFR: 115.27 mL/min (ref >60.00)
Glucose, Bld: 85 mg/dL (ref 70–99)
POTASSIUM: 3.8 meq/L (ref 3.5–5.1)
SODIUM: 137 meq/L (ref 135–145)

## 2015-04-29 LAB — VITAMIN D 25 HYDROXY (VIT D DEFICIENCY, FRACTURES): VITD: 25.52 ng/mL — ABNORMAL LOW (ref 30.00–100.00)

## 2015-04-29 LAB — LIPASE: Lipase: 13 U/L (ref 11.0–59.0)

## 2015-04-29 MED ORDER — LUBIPROSTONE 24 MCG PO CAPS: ORAL_CAPSULE | ORAL | Status: DC

## 2015-04-29 MED ORDER — CYANOCOBALAMIN 1000 MCG/ML IJ SOLN
1000.0000 ug | Freq: Once | INTRAMUSCULAR | Status: AC
  Administered 2015-04-29: 1000 ug via INTRAMUSCULAR

## 2015-04-29 MED ORDER — PROMETHAZINE HCL 12.5 MG PO TABS: 12.5000 mg | ORAL_TABLET | Freq: Four times a day (QID) | ORAL | Status: DC | PRN

## 2015-04-29 MED ORDER — RANITIDINE HCL 300 MG PO CAPS: 300.0000 mg | ORAL_CAPSULE | Freq: Every evening | ORAL | Status: DC

## 2015-04-29 NOTE — Assessment & Plan Note (Signed)
Start Amitiza prn

## 2015-04-29 NOTE — Assessment & Plan Note (Signed)
On B12 q 2 weeks 

## 2015-04-29 NOTE — Assessment & Plan Note (Signed)
Lexapro 

## 2015-04-29 NOTE — Assessment & Plan Note (Signed)
Vit D 

## 2015-04-29 NOTE — Progress Notes (Signed)
Subjective:  Patient ID: Dan Adams, male    DOB: 1991/03/28  Age: 25 y.o. MRN: [id number]  CC: No chief complaint on file.   HPI Terique Sheaffer presents for nausea, rashes, constipation, bleeding hemorrhoids. F/u lupus. C/o GERD, wt loss. C/o sinus congestion.  Outpatient Prescriptions Prior to Visit  Medication Sig Dispense Refill  . aspirin EC 81 MG EC tablet Take 1 tablet (81 mg total) by mouth daily. 30 tablet 0  . Cholecalciferol (VITAMIN D3) 2000 UNITS capsule Take 1 capsule (2,000 Units total) by mouth daily. 100 capsule 3  . cyanocobalamin (COBAL-1000) 1000 MCG/ML injection Inject 1 mL (1,000 mcg total) into the muscle every 14 (fourteen) days. 10 mL 6  . losartan (COZAAR) 100 MG tablet Take 1 tablet (100 mg total) by mouth daily. 30 tablet 11  . Syringe/Needle, Disp, (B-D ECLIPSE SYRINGE) 30G X 1/2" 1 ML MISC 1 each by Does not apply route 1 day or 1 dose. For B12 injections 50 each 11  . HYDROXYCHLOROQUINE SULFATE PO Take 200 mg by mouth every morning. 2 tabs in am = 400 mg    . predniSONE (DELTASONE) 10 MG tablet 5 mg po qd pc 120 tablet 1  . acyclovir (ZOVIRAX) 400 MG tablet Take 1 tablet (400 mg total) by mouth 3 (three) times daily. (Patient not taking: Reported on 04/29/2015) 21 tablet 3  . amLODipine (NORVASC) 10 MG tablet Take 1 tablet (10 mg total) by mouth daily. (Patient not taking: Reported on 04/29/2015) 30 tablet 0  . atorvastatin (LIPITOR) 40 MG tablet Take 1 tablet (40 mg total) by mouth daily at 6 PM. (Patient not taking: Reported on 04/29/2015) 30 tablet 0  . cetaphil (CETAPHIL) lotion Apply 1 application topically 2 (two) times daily. (Patient not taking: Reported on 04/29/2015) 236 mL 11  . escitalopram (LEXAPRO) 5 MG tablet Take 1 tablet (5 mg total) by mouth daily. (Patient not taking: Reported on 04/29/2015) 30 tablet 5  . triamcinolone ointment (KENALOG) 0.5 % Apply 1 application topically 2 (two) times daily. (Patient not taking: Reported on 04/29/2015)  45 g 3   No facility-administered medications prior to visit.    ROS Review of Systems  Constitutional: Positive for fatigue. Negative for appetite change and unexpected weight change.  HENT: Positive for dental problem, mouth sores and sore throat. Negative for congestion, nosebleeds, sneezing and trouble swallowing.   Eyes: Negative for itching and visual disturbance.  Respiratory: Negative for cough and shortness of breath.   Cardiovascular: Negative for chest pain, palpitations and leg swelling.  Gastrointestinal: Positive for vomiting, abdominal pain, constipation, anal bleeding and rectal pain. Negative for nausea, diarrhea, blood in stool and abdominal distention.  Genitourinary: Negative for dysuria, frequency and hematuria.  Musculoskeletal: Positive for arthralgias. Negative for back pain, joint swelling, gait problem and neck pain.  Skin: Positive for rash.  Neurological: Negative for dizziness, tremors, speech difficulty and weakness.  Psychiatric/Behavioral: Negative for suicidal ideas, sleep disturbance, dysphoric mood and agitation. The patient is not nervous/anxious.     Objective:  BP 110/70 mmHg  Pulse 102  Temp(Src) 99.1 F (37.3 C) (Oral)  Wt 141 lb (63.957 kg)  SpO2 99%  BP Readings from Last 3 Encounters:  04/29/15 110/70  03/02/15 150/84  02/28/15 135/83    Wt Readings from Last 3 Encounters:  04/29/15 141 lb (63.957 kg)  03/02/15 157 lb (71.215 kg)  02/28/15 159 lb 3.2 oz (72.213 kg)    Physical Exam  Constitutional: He is oriented to person,  place, and time. He appears well-developed. No distress.  NAD  HENT:  Mouth/Throat: Oropharynx is clear and moist.  Eyes: Conjunctivae are normal. Pupils are equal, round, and reactive to light.  Neck: Normal range of motion. No JVD present. No thyromegaly present.  Cardiovascular: Normal rate, regular rhythm, normal heart sounds and intact distal pulses.  Exam reveals no gallop and no friction rub.   No  murmur heard. Pulmonary/Chest: Effort normal and breath sounds normal. No respiratory distress. He has no wheezes. He has no rales. He exhibits no tenderness.  Abdominal: Soft. Bowel sounds are normal. He exhibits no distension and no mass. There is no tenderness. There is no rebound and no guarding.  Musculoskeletal: Normal range of motion. He exhibits no edema or tenderness.  Lymphadenopathy:    He has no cervical adenopathy.  Neurological: He is alert and oriented to person, place, and time. He has normal reflexes. No cranial nerve deficit. He exhibits normal muscle tone. He displays a negative Romberg sign. Coordination and gait normal.  Skin: Skin is warm and dry. Rash noted. There is erythema.  Psychiatric: He has a normal mood and affect. His behavior is normal. Judgment and thought content normal.  Nasal d/c, eryth throat Scaly rash Lab Results  Component Value Date   WBC 1.5 Repeated and verified X2.* 04/29/2015   HGB 12.1* 04/29/2015   HCT 36.0* 04/29/2015   PLT 206.0 04/29/2015   GLUCOSE 85 04/29/2015   CHOL 158 02/10/2015   TRIG 148 02/10/2015   HDL 25* 02/10/2015   LDLCALC 103* 02/10/2015   ALT 20 04/29/2015   AST 40* 04/29/2015   NA 137 04/29/2015   K 3.8 04/29/2015   CL 99 04/29/2015   CREATININE 1.01 04/29/2015   BUN 21 04/29/2015   CO2 28 04/29/2015   TSH 1.13 08/13/2014   HGBA1C 5.2 02/10/2015    Ct Angio Head W/cm &/or Wo Cm  02/10/2015  CLINICAL DATA:  Bilateral upper and lower extremity weakness, fever, and neck pain. Dizziness. EXAM: CT ANGIOGRAPHY HEAD AND NECK TECHNIQUE: Multidetector CT imaging of the head and neck was performed using the standard protocol during bolus administration of intravenous contrast. Multiplanar CT image reconstructions and MIPs were obtained to evaluate the vascular anatomy. Carotid stenosis measurements (when applicable) are obtained utilizing NASCET criteria, using the distal internal carotid diameter as the denominator.  CONTRAST:  71mL OMNIPAQUE IOHEXOL 350 MG/ML SOLN COMPARISON:  MRI brain and CT head without contrast from the same day. FINDINGS: CT HEAD Brain: No acute infarct, hemorrhage, or mass lesion is present. The ventricles are of normal size. No significant extraaxial fluid collection is present. Calvarium and skull base: Within normal limits Paranasal sinuses: Clear Orbits: Negative CTA NECK Aortic arch: The left vertebral artery originates from the aortic arch. A 4 vessel arch is present. There is no significant atherosclerotic disease or stenosis. The upper mediastinum is unremarkable. Right carotid system: The right common carotid artery is within normal limits. The bifurcation is within normal limits. The cervical right ICA is normal. There is mild tortuosity of the high cervical right ICA just below the skullbase without significant stenosis. Left carotid system: The left common carotid artery is within normal limits. The bifurcation is unremarkable. More prominent tortuosity is noted in the distal cervical left ICA without significant stenosis or evidence for FMD. Vertebral arteries:The right vertebral artery originates from the subclavian artery. There is some tortuosity of the right vertebral artery proximally and also at C5-6 on the right. There is  no focal stenosis or evidence for vascular injury within either vertebral artery in the neck. Skeleton: Reversal of the normal cervical lordosis is likely positional. No focal lytic or blastic lesions are present. The mandible is intact. Other neck: No focal mucosal or submucosal lesions are present. The thyroid is within normal limits. Reactive type lymph nodes are evident bilaterally. The most prominent lymph nodes are some a DVA there nodes bilaterally and. The salivary glands are unremarkable. CTA HEAD Anterior circulation: The internal carotid arteries are within normal limits from the high cervical segments through the ICA termini. The A1 and M1 segments are  normal. Anterior communicating artery is patent. The MCA bifurcations are intact. The ACA and MCA branch vessels are within normal limits. Posterior circulation: The PICA origins are visualized and normal. The basilar artery is normal. A posterior cerebral arteries originate from the basilar tip. There is contribution from a left posterior communicating artery as well. The PCA branch vessels are within normal limits. Venous sinuses: The dural sinuses are patent. Straight sinus and deep cerebral veins are intact. Cortical veins are unremarkable. Anatomic variants: None Delayed phase: No pathologic enhancement is present. IMPRESSION: 1. Tortuosity of the distal cervical internal carotid arteries is advanced for age, left greater than right. There is no associated stenosis, vascular injury, or FMD. 2. Mild tortuosity in the vertebral arteries in the neck. 3. Normal CTA circle of Willis without evidence for significant proximal stenosis, aneurysm, or branch vessel occlusion. 4. Normal CT appearance of the brain. Electronically Signed   By: Marin Roberts M.D.   On: 02/10/2015 21:37   Dg Chest 2 View  02/10/2015  CLINICAL DATA:  Stroke symptoms for 1 day.  Hypertension. EXAM: CHEST  2 VIEW COMPARISON:  10/25/2014 FINDINGS: The heart size and mediastinal contours are within normal limits. Both lungs are clear. The visualized skeletal structures are unremarkable. IMPRESSION: No active cardiopulmonary disease. Electronically Signed   By: Ellery Plunk M.D.   On: 02/10/2015 22:54   Ct Head Wo Contrast  02/10/2015  CLINICAL DATA:  Weakness and numbness on left side which began this morning. History of lupus EXAM: CT HEAD WITHOUT CONTRAST TECHNIQUE: Contiguous axial images were obtained from the base of the skull through the vertex without intravenous contrast. COMPARISON:  None. FINDINGS: No acute cortical infarct, hemorrhage, or mass lesion ispresent. Ventricles are of normal size. No significant extra-axial  fluid collection is present. The paranasal sinuses andmastoid air cells are clear. The osseous skull is intact. IMPRESSION: Negative exam. Electronically Signed   By: Signa Kell M.D.   On: 02/10/2015 10:34   Ct Angio Neck W/cm &/or Wo/cm  02/10/2015  CLINICAL DATA:  Bilateral upper and lower extremity weakness, fever, and neck pain. Dizziness. EXAM: CT ANGIOGRAPHY HEAD AND NECK TECHNIQUE: Multidetector CT imaging of the head and neck was performed using the standard protocol during bolus administration of intravenous contrast. Multiplanar CT image reconstructions and MIPs were obtained to evaluate the vascular anatomy. Carotid stenosis measurements (when applicable) are obtained utilizing NASCET criteria, using the distal internal carotid diameter as the denominator. CONTRAST:  44mL OMNIPAQUE IOHEXOL 350 MG/ML SOLN COMPARISON:  MRI brain and CT head without contrast from the same day. FINDINGS: CT HEAD Brain: No acute infarct, hemorrhage, or mass lesion is present. The ventricles are of normal size. No significant extraaxial fluid collection is present. Calvarium and skull base: Within normal limits Paranasal sinuses: Clear Orbits: Negative CTA NECK Aortic arch: The left vertebral artery originates from the aortic arch.  A 4 vessel arch is present. There is no significant atherosclerotic disease or stenosis. The upper mediastinum is unremarkable. Right carotid system: The right common carotid artery is within normal limits. The bifurcation is within normal limits. The cervical right ICA is normal. There is mild tortuosity of the high cervical right ICA just below the skullbase without significant stenosis. Left carotid system: The left common carotid artery is within normal limits. The bifurcation is unremarkable. More prominent tortuosity is noted in the distal cervical left ICA without significant stenosis or evidence for FMD. Vertebral arteries:The right vertebral artery originates from the subclavian  artery. There is some tortuosity of the right vertebral artery proximally and also at C5-6 on the right. There is no focal stenosis or evidence for vascular injury within either vertebral artery in the neck. Skeleton: Reversal of the normal cervical lordosis is likely positional. No focal lytic or blastic lesions are present. The mandible is intact. Other neck: No focal mucosal or submucosal lesions are present. The thyroid is within normal limits. Reactive type lymph nodes are evident bilaterally. The most prominent lymph nodes are some a DVA there nodes bilaterally and. The salivary glands are unremarkable. CTA HEAD Anterior circulation: The internal carotid arteries are within normal limits from the high cervical segments through the ICA termini. The A1 and M1 segments are normal. Anterior communicating artery is patent. The MCA bifurcations are intact. The ACA and MCA branch vessels are within normal limits. Posterior circulation: The PICA origins are visualized and normal. The basilar artery is normal. A posterior cerebral arteries originate from the basilar tip. There is contribution from a left posterior communicating artery as well. The PCA branch vessels are within normal limits. Venous sinuses: The dural sinuses are patent. Straight sinus and deep cerebral veins are intact. Cortical veins are unremarkable. Anatomic variants: None Delayed phase: No pathologic enhancement is present. IMPRESSION: 1. Tortuosity of the distal cervical internal carotid arteries is advanced for age, left greater than right. There is no associated stenosis, vascular injury, or FMD. 2. Mild tortuosity in the vertebral arteries in the neck. 3. Normal CTA circle of Willis without evidence for significant proximal stenosis, aneurysm, or branch vessel occlusion. 4. Normal CT appearance of the brain. Electronically Signed   By: Marin Roberts M.D.   On: 02/10/2015 21:37   Mr Brain Wo Contrast  02/10/2015  CLINICAL DATA:   25 year old male with acute onset of right side body tingling, paresthesia yesterday at 1630 hours. Treated and released at [facility]. Today new onset blurred vision in the left eye, slurred speech. Initial encounter. EXAM: MRI HEAD WITHOUT CONTRAST TECHNIQUE: Multiplanar, multiecho pulse sequences of the brain and surrounding structures were obtained without intravenous contrast. COMPARISON:  Head CT without contrast 1032 hours today. FINDINGS: Cerebral volume is normal. No restricted diffusion to suggest acute infarction. No midline shift, mass effect, evidence of mass lesion, ventriculomegaly, extra-axial collection or acute intracranial hemorrhage. Cervicomedullary junction and pituitary are within normal limits. Major intracranial vascular flow voids are within normal limits. Wallace Cullens and white matter signal is within normal limits throughout the brain. No chronic cerebral blood products. Visible internal auditory structures appear normal. Mastoids are clear. Paranasal sinuses are clear. Orbit and scalp soft tissues appear normal. Negative visualized cervical spine. Visualized bone marrow signal is within normal limits. IMPRESSION: Normal noncontrast MRI appearance of the brain. Electronically Signed   By: Odessa Fleming M.D.   On: 02/10/2015 13:51    Assessment & Plan:   Diagnoses and  all orders for this visit:  Gastroesophageal reflux disease, esophagitis presence not specified -     CBC with Differential/Platelet; Future -     Basic metabolic panel; Future -     Hepatic function panel; Future -     Lipase; Future -     Urinalysis; Future -     VITAMIN D 25 Hydroxy (Vit-D Deficiency, Fractures); Future -     Ova and parasite examination; Future  Hypertension, secondary -     CBC with Differential/Platelet; Future -     Basic metabolic panel; Future -     Hepatic function panel; Future -     Lipase; Future -     Urinalysis; Future -     VITAMIN D 25 Hydroxy (Vit-D Deficiency, Fractures);  Future  Vitamin B12 deficiency -     CBC with Differential/Platelet; Future -     Basic metabolic panel; Future -     Hepatic function panel; Future -     Lipase; Future -     Urinalysis; Future -     VITAMIN D 25 Hydroxy (Vit-D Deficiency, Fractures); Future -     cyanocobalamin ((VITAMIN B-12)) injection 1,000 mcg; Inject 1 mL (1,000 mcg total) into the muscle once.  Vitamin D deficiency -     CBC with Differential/Platelet; Future -     Basic metabolic panel; Future -     Hepatic function panel; Future -     Lipase; Future -     Urinalysis; Future -     VITAMIN D 25 Hydroxy (Vit-D Deficiency, Fractures); Future  Constipation, unspecified constipation type -     CBC with Differential/Platelet; Future -     Basic metabolic panel; Future -     Hepatic function panel; Future -     Lipase; Future -     Urinalysis; Future -     VITAMIN D 25 Hydroxy (Vit-D Deficiency, Fractures); Future -     Ova and parasite examination; Future  Generalized anxiety disorder -     CBC with Differential/Platelet; Future -     Basic metabolic panel; Future -     Hepatic function panel; Future -     Lipase; Future -     Urinalysis; Future -     VITAMIN D 25 Hydroxy (Vit-D Deficiency, Fractures); Future  Leukopenia  Fever, unspecified  Other orders -     ranitidine (ZANTAC) 300 MG capsule; Take 1 capsule (300 mg total) by mouth every evening. -     promethazine (PHENERGAN) 12.5 MG tablet; Take 1 tablet (12.5 mg total) by mouth every 6 (six) hours as needed for nausea or vomiting. -     lubiprostone (AMITIZA) 24 MCG capsule; Take one qd or bid for constipation -     amoxicillin-clavulanate (AUGMENTIN) 875-125 MG tablet; Take 1 tablet by mouth 2 (two) times daily.   I have discontinued Mr. Kataoka's HYDROXYCHLOROQUINE SULFATE PO. I am also having him start on ranitidine, promethazine, lubiprostone, and amoxicillin-clavulanate. Additionally, I am having him maintain his Vitamin D3, losartan,  aspirin, atorvastatin, amLODipine, cyanocobalamin, Syringe/Needle (Disp), escitalopram, cetaphil, triamcinolone ointment, acyclovir, hydroxychloroquine, and predniSONE. We administered cyanocobalamin.  Meds ordered this encounter  Medications  . hydroxychloroquine (PLAQUENIL) 200 MG tablet    Sig: Take 1 tablet by mouth daily.    Refill:  0  . predniSONE (DELTASONE) 5 MG tablet    Sig: Take 1 tablet by mouth daily.    Refill:  0  . ranitidine (ZANTAC) 300 MG  capsule    Sig: Take 1 capsule (300 mg total) by mouth every evening.    Dispense:  30 capsule    Refill:  11  . promethazine (PHENERGAN) 12.5 MG tablet    Sig: Take 1 tablet (12.5 mg total) by mouth every 6 (six) hours as needed for nausea or vomiting.    Dispense:  30 tablet    Refill:  0  . lubiprostone (AMITIZA) 24 MCG capsule    Sig: Take one qd or bid for constipation    Dispense:  60 capsule    Refill:  11  . cyanocobalamin ((VITAMIN B-12)) injection 1,000 mcg    Sig:   . amoxicillin-clavulanate (AUGMENTIN) 875-125 MG tablet    Sig: Take 1 tablet by mouth 2 (two) times daily.    Dispense:  20 tablet    Refill:  0     Follow-up: Return in about 6 weeks (around 06/10/2015) for a follow-up visit.  Sonda Primes, MD

## 2015-04-29 NOTE — Assessment & Plan Note (Signed)
Lupus related Amlodipine, Losartan - no side effects

## 2015-04-29 NOTE — Progress Notes (Signed)
Pre visit review using our clinic review tool, if applicable. No additional management support is needed unless otherwise documented below in the visit note. 

## 2015-04-29 NOTE — Assessment & Plan Note (Signed)
Start Zantac 300 mg Promethazine prn

## 2015-04-30 ENCOUNTER — Encounter: Payer: Self-pay | Admitting: Internal Medicine

## 2015-04-30 DIAGNOSIS — R509 Fever, unspecified: Secondary | ICD-10-CM | POA: Insufficient documentation

## 2015-04-30 MED ORDER — AMOXICILLIN-POT CLAVULANATE 875-125 MG PO TABS: 1.0000 | ORAL_TABLET | Freq: Two times a day (BID) | ORAL | Status: DC

## 2015-04-30 NOTE — Assessment & Plan Note (Signed)
Recheck CBC Augmentin for 10 d for URI/sinusitis -like sx's

## 2015-04-30 NOTE — Assessment & Plan Note (Addendum)
Lupus related -  worse F/u Dr Myna Hidalgo 2016 Recheck CBC Augmentin for 10 d for URI/sinusitis -like sx's

## 2015-05-01 ENCOUNTER — Encounter (HOSPITAL_COMMUNITY): Payer: Self-pay | Admitting: *Deleted

## 2015-05-01 ENCOUNTER — Emergency Department (HOSPITAL_COMMUNITY)
Admission: EM | Admit: 2015-05-01 | Discharge: 2015-05-02 | Disposition: A | Discharge status: Home / Self Care | Payer: BLUE CROSS/BLUE SHIELD | Attending: Emergency Medicine | Admitting: Emergency Medicine

## 2015-05-01 DIAGNOSIS — I10 Essential (primary) hypertension: Secondary | ICD-10-CM | POA: Insufficient documentation

## 2015-05-01 DIAGNOSIS — Z862 Personal history of diseases of the blood and blood-forming organs and certain disorders involving the immune mechanism: Secondary | ICD-10-CM | POA: Diagnosis not present

## 2015-05-01 DIAGNOSIS — R509 Fever, unspecified: Secondary | ICD-10-CM | POA: Diagnosis present

## 2015-05-01 DIAGNOSIS — IMO0002 Reserved for concepts with insufficient information to code with codable children: Secondary | ICD-10-CM

## 2015-05-01 DIAGNOSIS — R112 Nausea with vomiting, unspecified: Secondary | ICD-10-CM | POA: Insufficient documentation

## 2015-05-01 DIAGNOSIS — Z7952 Long term (current) use of systemic steroids: Secondary | ICD-10-CM | POA: Diagnosis not present

## 2015-05-01 DIAGNOSIS — M069 Rheumatoid arthritis, unspecified: Secondary | ICD-10-CM | POA: Diagnosis not present

## 2015-05-01 DIAGNOSIS — G43909 Migraine, unspecified, not intractable, without status migrainosus: Secondary | ICD-10-CM | POA: Diagnosis not present

## 2015-05-01 DIAGNOSIS — R197 Diarrhea, unspecified: Secondary | ICD-10-CM | POA: Diagnosis not present

## 2015-05-01 DIAGNOSIS — F419 Anxiety disorder, unspecified: Secondary | ICD-10-CM | POA: Insufficient documentation

## 2015-05-01 DIAGNOSIS — Z79899 Other long term (current) drug therapy: Secondary | ICD-10-CM | POA: Insufficient documentation

## 2015-05-01 DIAGNOSIS — G8929 Other chronic pain: Secondary | ICD-10-CM | POA: Diagnosis not present

## 2015-05-01 DIAGNOSIS — Z87891 Personal history of nicotine dependence: Secondary | ICD-10-CM | POA: Insufficient documentation

## 2015-05-01 DIAGNOSIS — J45909 Unspecified asthma, uncomplicated: Secondary | ICD-10-CM | POA: Insufficient documentation

## 2015-05-01 DIAGNOSIS — R42 Dizziness and giddiness: Secondary | ICD-10-CM | POA: Insufficient documentation

## 2015-05-01 DIAGNOSIS — L298 Other pruritus: Secondary | ICD-10-CM | POA: Diagnosis not present

## 2015-05-01 DIAGNOSIS — F329 Major depressive disorder, single episode, unspecified: Secondary | ICD-10-CM | POA: Diagnosis not present

## 2015-05-01 DIAGNOSIS — M329 Systemic lupus erythematosus, unspecified: Secondary | ICD-10-CM

## 2015-05-01 MED ORDER — ACETAMINOPHEN 325 MG PO TABS
650.0000 mg | ORAL_TABLET | Freq: Once | ORAL | Status: AC
  Administered 2015-05-01: 650 mg via ORAL
  Filled 2015-05-01: qty 2

## 2015-05-01 NOTE — ED Notes (Signed)
Recent approx 25 lb weight loss

## 2015-05-01 NOTE — ED Notes (Signed)
Itching tongue  Tonight he also has itching to his neck and  Head  Body aches since yesterday.  He has lupus  No itching now  No med taken

## 2015-05-02 ENCOUNTER — Emergency Department (HOSPITAL_COMMUNITY): Payer: BLUE CROSS/BLUE SHIELD

## 2015-05-02 LAB — URINALYSIS, ROUTINE W REFLEX MICROSCOPIC
Bilirubin Urine: NEGATIVE
GLUCOSE, UA: NEGATIVE mg/dL
Ketones, ur: NEGATIVE mg/dL
LEUKOCYTES UA: NEGATIVE
Nitrite: NEGATIVE
PH: 5.5 (ref 5.0–8.0)
Protein, ur: 100 mg/dL — AB
Specific Gravity, Urine: 1.013 (ref 1.005–1.030)

## 2015-05-02 LAB — COMPREHENSIVE METABOLIC PANEL
ALBUMIN: 3.4 g/dL — AB (ref 3.5–5.0)
ALT: 24 U/L (ref 17–63)
AST: 49 U/L — AB (ref 15–41)
Alkaline Phosphatase: 34 U/L — ABNORMAL LOW (ref 38–126)
Anion gap: 11 (ref 5–15)
BUN: 17 mg/dL (ref 6–20)
CHLORIDE: 100 mmol/L — AB (ref 101–111)
CO2: 24 mmol/L (ref 22–32)
Calcium: 8.8 mg/dL — ABNORMAL LOW (ref 8.9–10.3)
Creatinine, Ser: 1.03 mg/dL (ref 0.61–1.24)
GFR calc Af Amer: >60 mL/min (ref >60)
GLUCOSE: 112 mg/dL — AB (ref 65–99)
POTASSIUM: 4.3 mmol/L (ref 3.5–5.1)
SODIUM: 135 mmol/L (ref 135–145)
Total Bilirubin: 0.6 mg/dL (ref 0.3–1.2)
Total Protein: 7.2 g/dL (ref 6.5–8.1)

## 2015-05-02 LAB — CBC WITH DIFFERENTIAL/PLATELET
BASOS PCT: 1 %
Basophils Absolute: 0 10*3/uL (ref 0.0–0.1)
EOS PCT: 0 %
Eosinophils Absolute: 0 10*3/uL (ref 0.0–0.7)
HEMATOCRIT: 36.9 % — AB (ref 39.0–52.0)
Hemoglobin: 12.4 g/dL — ABNORMAL LOW (ref 13.0–17.0)
LYMPHS ABS: 0.2 10*3/uL — AB (ref 0.7–4.0)
Lymphocytes Relative: 12 %
MCH: 29 pg (ref 26.0–34.0)
MCHC: 33.6 g/dL (ref 30.0–36.0)
MCV: 86.2 fL (ref 78.0–100.0)
MONOS PCT: 5 %
Monocytes Absolute: 0.1 10*3/uL (ref 0.1–1.0)
Neutro Abs: 1.2 10*3/uL — ABNORMAL LOW (ref 1.7–7.7)
Neutrophils Relative %: 82 %
Platelets: 189 10*3/uL (ref 150–400)
RBC: 4.28 MIL/uL (ref 4.22–5.81)
RDW: 12.6 % (ref 11.5–15.5)
WBC: 1.5 10*3/uL — AB (ref 4.0–10.5)

## 2015-05-02 LAB — URINE MICROSCOPIC-ADD ON

## 2015-05-02 LAB — RAPID STREP SCREEN (MED CTR MEBANE ONLY): Streptococcus, Group A Screen (Direct): NEGATIVE

## 2015-05-02 LAB — I-STAT CG4 LACTIC ACID, ED: Lactic Acid, Venous: 1.21 mmol/L (ref 0.5–2.0)

## 2015-05-02 MED ORDER — SODIUM CHLORIDE 0.9 % IV BOLUS (SEPSIS)
1000.0000 mL | Freq: Once | INTRAVENOUS | Status: AC
  Administered 2015-05-02: 1000 mL via INTRAVENOUS

## 2015-05-02 MED ORDER — IBUPROFEN 800 MG PO TABS
800.0000 mg | ORAL_TABLET | Freq: Once | ORAL | Status: AC
  Administered 2015-05-02: 800 mg via ORAL
  Filled 2015-05-02: qty 1

## 2015-05-02 NOTE — ED Provider Notes (Signed)
CSN: [id number]     Arrival date & time 05/01/15  2320 History  By signing my name below, I, Dan Adams, attest that this documentation has been prepared under the direction and in the presence of Geoffery Lyons, MD. Electronically Signed: [name], ED Scribe. 05/02/2015. 12:51 AM     Chief Complaint  Patient presents with  . Pruritis   The history is provided by the patient. No language interpreter was used.   Dan Adams is a 25 y.o. male with history of lupus, rheumatoid arthritis, and HTN who presents to the Emergency Department complaining of new and constant itching at the tongue and the roof of the mouth with onset yesterday. Associated symptoms include tactile fever, arthralgias, light headedness, nausea, vomiting, diarrhea after a stool softener from his PCP, and  a scab at the back of the throat for 2 days. He states that when he swallows he feels a strain at the back of the neck but denies sore throat.  The patient's mother notes decreased appetite and weight loss over the last few months. He has had no known sick contact.  Past Medical History  Diagnosis Date  . Leukopenia 09/07/2013  . Thrombocytopenia, unspecified (HCC) 09/07/2013  . Systemic lupus (HCC)   . Hypertension   . Childhood asthma   . Migraine     "@ least 2-3 times/wk now" (02/10/2015)  . RA (rheumatoid arthritis) (HCC)     "feet, fingers, knees; it's from my lupus" (02/10/2015)  . Chronic back pain   . Anxiety   . Depression   . Stroke-like symptoms 02/09/2015-02/10/2015    "right-left"   Past Surgical History  Procedure Laterality Date  . Tonsillectomy and adenoidectomy  2007   No family history on file. Social History  Substance Use Topics  . Smoking status: Former Smoker -- 2.00 packs/day for 3 years    Types: Cigars, Cigarettes    Start date: 04/09/2010    Quit date: 01/06/2014  . Smokeless tobacco: Never Used     Comment: 02/10/2015 "quit smoking over 1 yr ago'  . Alcohol Use: No     Review of Systems 10 Systems reviewed and all are negative for acute change except as noted in the HPI.   Allergies  Codeine and Promethazine  Home Medications   Prior to Admission medications   Medication Sig Start Date End Date Taking? Authorizing Provider  acyclovir (ZOVIRAX) 400 MG tablet Take 1 tablet (400 mg total) by mouth 3 (three) times daily. Patient not taking: Reported on 04/29/2015 03/02/15   Aleksei Plotnikov V, MD  amLODipine (NORVASC) 10 MG tablet Take 1 tablet (10 mg total) by mouth daily. Patient not taking: Reported on 04/29/2015 02/12/15   Leroy Sea, MD  amoxicillin-clavulanate (AUGMENTIN) 875-125 MG tablet Take 1 tablet by mouth 2 (two) times daily. 04/30/15   Aleksei Plotnikov V, MD  aspirin EC 81 MG EC tablet Take 1 tablet (81 mg total) by mouth daily. 02/12/15   Leroy Sea, MD  atorvastatin (LIPITOR) 40 MG tablet Take 1 tablet (40 mg total) by mouth daily at 6 PM. Patient not taking: Reported on 04/29/2015 02/12/15   Leroy Sea, MD  cetaphil (CETAPHIL) lotion Apply 1 application topically 2 (two) times daily. Patient not taking: Reported on 04/29/2015 03/02/15   Tresa Garter, MD  Cholecalciferol (VITAMIN D3) 2000 UNITS capsule Take 1 capsule (2,000 Units total) by mouth daily. 12/28/14   Aleksei Plotnikov V, MD  cyanocobalamin (COBAL-1000) 1000 MCG/ML injection Inject 1  mL (1,000 mcg total) into the muscle every 14 (fourteen) days. 03/02/15   Aleksei Plotnikov V, MD  escitalopram (LEXAPRO) 5 MG tablet Take 1 tablet (5 mg total) by mouth daily. Patient not taking: Reported on 04/29/2015 03/02/15   Jacinta Shoe V, MD  hydroxychloroquine (PLAQUENIL) 200 MG tablet Take 1 tablet by mouth daily. 04/13/15   Historical Provider, MD  losartan (COZAAR) 100 MG tablet Take 1 tablet (100 mg total) by mouth daily. 02/03/15   Aleksei Plotnikov V, MD  lubiprostone (AMITIZA) 24 MCG capsule Take one qd or bid for constipation 04/29/15   Aleksei Plotnikov V, MD   predniSONE (DELTASONE) 5 MG tablet Take 1 tablet by mouth daily. 04/13/15   Historical Provider, MD  promethazine (PHENERGAN) 12.5 MG tablet Take 1 tablet (12.5 mg total) by mouth every 6 (six) hours as needed for nausea or vomiting. 04/29/15   Tresa Garter, MD  ranitidine (ZANTAC) 300 MG capsule Take 1 capsule (300 mg total) by mouth every evening. 04/29/15   Aleksei Plotnikov V, MD  Syringe/Needle, Disp, (B-D ECLIPSE SYRINGE) 30G X 1/2" 1 ML MISC 1 each by Does not apply route 1 day or 1 dose. For B12 injections 03/02/15   Aleksei Plotnikov V, MD  triamcinolone ointment (KENALOG) 0.5 % Apply 1 application topically 2 (two) times daily. Patient not taking: Reported on 04/29/2015 03/02/15   Aleksei Plotnikov V, MD   BP 123/80 mmHg  Pulse 118  Temp(Src) 102.3 F (39.1 C) (Oral)  Resp 22  Ht 5\' 4"  (1.626 m)  Wt 141 lb (63.957 kg)  BMI 24.19 kg/m2  SpO2 100% Physical Exam  Constitutional: He is oriented to person, place, and time. He appears well-developed and well-nourished.  HENT:  Head: Normocephalic and atraumatic.  The PO is mildly erythematous. No exudates. There are several small aphthous lesions to the underside of the tongue.   Eyes: EOM are normal.  Neck: Normal range of motion.  Cardiovascular: Normal rate, regular rhythm, normal heart sounds and intact distal pulses.   Pulmonary/Chest: Effort normal and breath sounds normal. No respiratory distress.  Abdominal: Soft. He exhibits no distension. There is no tenderness.  Musculoskeletal: Normal range of motion.  Neurological: He is alert and oriented to person, place, and time.  Skin: Skin is warm and dry.  Psychiatric: He has a normal mood and affect. Judgment normal.  Nursing note and vitals reviewed.   ED Course  Procedures (including critical care time) DIAGNOSTIC STUDIES: Oxygen Saturation is 100% on RA,  normal by my interpretation.    COORDINATION OF CARE: 12:44 AM Discussed treatment plan which includes lab  work, CXR, and Tylenol with pt at bedside and pt agreed to plan.  Labs Review Labs Reviewed  CBC WITH DIFFERENTIAL/PLATELET - Abnormal; Notable for the following:    WBC 1.5 (*)    Hemoglobin 12.4 (*)    HCT 36.9 (*)    All other components within normal limits  CULTURE, BLOOD (ROUTINE X 2)  CULTURE, BLOOD (ROUTINE X 2)  URINE CULTURE  RAPID STREP SCREEN (NOT AT [facility])  COMPREHENSIVE METABOLIC PANEL  URINALYSIS, ROUTINE W REFLEX MICROSCOPIC (NOT AT [facility])  I-STAT CG4 LACTIC ACID, ED    Imaging Review Dg Chest 2 View  05/02/2015  CLINICAL DATA:  Fever, rash, and itching tonight. Chest pain. Pain with swallowing. EXAM: CHEST  2 VIEW COMPARISON:  02/10/2015 FINDINGS: Slightly shallow inspiration. The heart size and mediastinal contours are within normal limits. Both lungs are clear. The visualized skeletal structures  are unremarkable. IMPRESSION: No active cardiopulmonary disease. Electronically Signed   By: Burman Nieves M.D.   On: 05/02/2015 00:47   I have personally reviewed and evaluated these images and lab results as part of my medical decision-making.   EKG Interpretation None      MDM   Final diagnoses:  None    Patient is a 25 year old male with history of lupus. He presents for evaluation of fever and myalgias for the past 2 days. He denies any neck pain, chest pain, difficulty breathing, abdominal pain, or other complaints. He was febrile upon presentation, however defervesced after receiving ibuprofen. His workup reveals a white count of 1.5 with a mild left shift, however otherwise unremarkable CBC. Strep test is negative and chest x-ray is clear. He was hydrated with normal saline and appears to be feeling better. He has no urinary complaints.  He was initially febrile upon presentation with an elevated heart rate, however no hypotension and he is nontoxic-appearing. He has remained normotensive throughout his emergency department stay and he does not appear septic or  toxic. Blood cultures have been obtained and are pending. He will be discharged with rotating Tylenol and Motrin, and when necessary return if he worsens. I suspect a viral etiology to his symptoms as I have been unable to identify an alternate cause.  I personally performed the services described in this documentation, which was scribed in my presence. The recorded information has been reviewed and is accurate.       Geoffery Lyons, MD 05/02/15 [phone]

## 2015-05-02 NOTE — Discharge Instructions (Signed)
Tylenol 1000 mg rotated with Motrin 600 mg every 3 hours as needed for fever.  We will call you if your cultures indicate you require further treatment.  Return to the emergency department if your symptoms significantly change or worsen in the meantime.   Fever, Adult A fever is an increase in the body's temperature. It is usually defined as a temperature of 100F (38C) or higher. Brief mild or moderate fevers generally have no long-term effects, and they often do not require treatment. Moderate or high fevers may make you feel uncomfortable and can sometimes be a sign of a serious illness or disease. The sweating that may occur with repeated or prolonged fever may also cause dehydration. Fever is confirmed by taking a temperature with a thermometer. A measured temperature can vary with:  Age.  Time of day.  Location of the thermometer:  Mouth (oral).  Rectum (rectal).  Ear (tympanic).  Underarm (axillary).  Forehead (temporal). HOME CARE INSTRUCTIONS Pay attention to any changes in your symptoms. Take these actions to help with your condition:  Take over-the counter and prescription medicines only as told by your health care provider. Follow the dosing instructions carefully.  If you were prescribed an antibiotic medicine, take it as told by your health care provider. Do not stop taking the antibiotic even if you start to feel better.  Rest as needed.  Drink enough fluid to keep your urine clear or pale yellow. This helps to prevent dehydration.  Sponge yourself or bathe with room-temperature water to help reduce your body temperature as needed. Do not use ice water.  Do not overbundle yourself in blankets or heavy clothes. SEEK MEDICAL CARE IF:  You vomit.  You cannot eat or drink without vomiting.  You have diarrhea.  You have pain when you urinate.  Your symptoms do not improve with treatment.  You develop new symptoms.  You develop excessive weakness. SEEK  IMMEDIATE MEDICAL CARE IF:  You have shortness of breath or have trouble breathing.  You are dizzy or you faint.  You are disoriented or confused.  You develop signs of dehydration, such as a dry mouth, decreased urination, or paleness.  You develop severe pain in your abdomen.  You have persistent vomiting or diarrhea.  You develop a skin rash.  Your symptoms suddenly get worse.   This information is not intended to replace advice given to you by your health care provider. Make sure you discuss any questions you have with your health care provider.   Document Released: 09/19/2000 Document Revised: 12/15/2014 Document Reviewed: 05/20/2014 Elsevier Interactive Patient Education [employer]

## 2015-05-03 LAB — URINE CULTURE

## 2015-05-04 LAB — CULTURE, GROUP A STREP (THRC)

## 2015-05-05 ENCOUNTER — Other Ambulatory Visit: Payer: BLUE CROSS/BLUE SHIELD

## 2015-05-05 DIAGNOSIS — K5900 Constipation, unspecified: Secondary | ICD-10-CM

## 2015-05-05 DIAGNOSIS — K219 Gastro-esophageal reflux disease without esophagitis: Secondary | ICD-10-CM

## 2015-05-06 LAB — OVA AND PARASITE EXAMINATION: OP: NONE SEEN

## 2015-05-07 LAB — CULTURE, BLOOD (ROUTINE X 2)
CULTURE: NO GROWTH
Culture: NO GROWTH

## 2015-05-08 ENCOUNTER — Emergency Department (HOSPITAL_BASED_OUTPATIENT_CLINIC_OR_DEPARTMENT_OTHER)
Admission: EM | Admit: 2015-05-08 | Discharge: 2015-05-08 | Disposition: A | Discharge status: Home / Self Care | Payer: BLUE CROSS/BLUE SHIELD | Attending: Emergency Medicine | Admitting: Emergency Medicine

## 2015-05-08 ENCOUNTER — Encounter (HOSPITAL_BASED_OUTPATIENT_CLINIC_OR_DEPARTMENT_OTHER): Payer: Self-pay | Admitting: *Deleted

## 2015-05-08 DIAGNOSIS — D72819 Decreased white blood cell count, unspecified: Secondary | ICD-10-CM | POA: Diagnosis not present

## 2015-05-08 DIAGNOSIS — R05 Cough: Secondary | ICD-10-CM | POA: Insufficient documentation

## 2015-05-08 DIAGNOSIS — M329 Systemic lupus erythematosus, unspecified: Secondary | ICD-10-CM | POA: Insufficient documentation

## 2015-05-08 DIAGNOSIS — R21 Rash and other nonspecific skin eruption: Secondary | ICD-10-CM | POA: Insufficient documentation

## 2015-05-08 DIAGNOSIS — G8929 Other chronic pain: Secondary | ICD-10-CM | POA: Insufficient documentation

## 2015-05-08 DIAGNOSIS — R197 Diarrhea, unspecified: Secondary | ICD-10-CM | POA: Diagnosis not present

## 2015-05-08 DIAGNOSIS — D696 Thrombocytopenia, unspecified: Secondary | ICD-10-CM | POA: Insufficient documentation

## 2015-05-08 DIAGNOSIS — M069 Rheumatoid arthritis, unspecified: Secondary | ICD-10-CM | POA: Insufficient documentation

## 2015-05-08 DIAGNOSIS — E860 Dehydration: Secondary | ICD-10-CM | POA: Diagnosis not present

## 2015-05-08 DIAGNOSIS — Z7952 Long term (current) use of systemic steroids: Secondary | ICD-10-CM | POA: Diagnosis not present

## 2015-05-08 DIAGNOSIS — R112 Nausea with vomiting, unspecified: Secondary | ICD-10-CM | POA: Diagnosis present

## 2015-05-08 DIAGNOSIS — D649 Anemia, unspecified: Secondary | ICD-10-CM | POA: Insufficient documentation

## 2015-05-08 DIAGNOSIS — Z7982 Long term (current) use of aspirin: Secondary | ICD-10-CM | POA: Insufficient documentation

## 2015-05-08 DIAGNOSIS — J45909 Unspecified asthma, uncomplicated: Secondary | ICD-10-CM | POA: Insufficient documentation

## 2015-05-08 DIAGNOSIS — Z79899 Other long term (current) drug therapy: Secondary | ICD-10-CM | POA: Insufficient documentation

## 2015-05-08 DIAGNOSIS — Z8659 Personal history of other mental and behavioral disorders: Secondary | ICD-10-CM | POA: Insufficient documentation

## 2015-05-08 DIAGNOSIS — I10 Essential (primary) hypertension: Secondary | ICD-10-CM | POA: Insufficient documentation

## 2015-05-08 DIAGNOSIS — Z87891 Personal history of nicotine dependence: Secondary | ICD-10-CM | POA: Insufficient documentation

## 2015-05-08 DIAGNOSIS — R634 Abnormal weight loss: Secondary | ICD-10-CM | POA: Insufficient documentation

## 2015-05-08 LAB — URINALYSIS, ROUTINE W REFLEX MICROSCOPIC
BILIRUBIN URINE: NEGATIVE
Glucose, UA: NEGATIVE mg/dL
KETONES UR: NEGATIVE mg/dL
Leukocytes, UA: NEGATIVE
NITRITE: NEGATIVE
Protein, ur: 30 mg/dL — AB
SPECIFIC GRAVITY, URINE: 1.004 — AB (ref 1.005–1.030)
pH: 6.5 (ref 5.0–8.0)

## 2015-05-08 LAB — CBC
HCT: 33.2 % — ABNORMAL LOW (ref 39.0–52.0)
HEMOGLOBIN: 10.8 g/dL — AB (ref 13.0–17.0)
MCH: 28.6 pg (ref 26.0–34.0)
MCHC: 32.5 g/dL (ref 30.0–36.0)
MCV: 87.8 fL (ref 78.0–100.0)
Platelets: 21 10*3/uL — CL (ref 150–400)
RBC: 3.78 MIL/uL — ABNORMAL LOW (ref 4.22–5.81)
RDW: 12.6 % (ref 11.5–15.5)
WBC: 1.7 10*3/uL — AB (ref 4.0–10.5)

## 2015-05-08 LAB — DIFFERENTIAL
BAND NEUTROPHILS: 5 %
BASOS ABS: 0 10*3/uL (ref 0.0–0.1)
Basophils Relative: 0 %
Blasts: 0 %
EOS ABS: 0 10*3/uL (ref 0.0–0.7)
EOS PCT: 0 %
LYMPHS ABS: 0.5 10*3/uL — AB (ref 0.7–4.0)
Lymphocytes Relative: 27 %
METAMYELOCYTES PCT: 2 %
MONOS PCT: 4 %
MYELOCYTES: 6 %
Monocytes Absolute: 0.1 10*3/uL (ref 0.1–1.0)
NEUTROS ABS: 1.1 10*3/uL — AB (ref 1.7–7.7)
Neutrophils Relative %: 53 %
Other: 0 %
Promyelocytes Absolute: 3 %
nRBC: 0 /100 WBC

## 2015-05-08 LAB — URINE MICROSCOPIC-ADD ON

## 2015-05-08 LAB — BASIC METABOLIC PANEL
ANION GAP: 10 (ref 5–15)
BUN: 14 mg/dL (ref 6–20)
CALCIUM: 8.4 mg/dL — AB (ref 8.9–10.3)
CO2: 25 mmol/L (ref 22–32)
Chloride: 99 mmol/L — ABNORMAL LOW (ref 101–111)
Creatinine, Ser: 1.03 mg/dL (ref 0.61–1.24)
GLUCOSE: 88 mg/dL (ref 65–99)
Potassium: 4.3 mmol/L (ref 3.5–5.1)
SODIUM: 134 mmol/L — AB (ref 135–145)

## 2015-05-08 MED ORDER — SODIUM CHLORIDE 0.9 % IV BOLUS (SEPSIS)
1000.0000 mL | Freq: Once | INTRAVENOUS | Status: AC
  Administered 2015-05-08: 1000 mL via INTRAVENOUS

## 2015-05-08 MED ORDER — METOCLOPRAMIDE HCL 10 MG PO TABS: 10.0000 mg | ORAL_TABLET | Freq: Four times a day (QID) | ORAL | Status: DC | PRN

## 2015-05-08 NOTE — Discharge Instructions (Signed)
Dr. Darrall Dears office will call you to arrange to be seen within the next 2 days. If you don't hear from the office by noon on May 10, 2015, call to schedule an appointment. Tell office staff that Dr.Fitzroy Mikami spoke with Dr.Magrinat about your case. Drink at least six 8 ounce glasses of water or Gatorade each day. Take the medication prescribed as needed for nausea. Avoid milk or foods containing milk such as cheese or ice cream all having diarrhea. Return immediately if you develop notice blood in your bowel movements, blood in your urine, significant headaches, vomit blood or if you feel worse for any reason.

## 2015-05-08 NOTE — ED Notes (Signed)
Per mother patient feels tired & unable to hold food down/ He went to [facility] last week because he felt dehydrated and felt better for a few days, but has become fatigued with any exertion

## 2015-05-08 NOTE — ED Provider Notes (Addendum)
CSN: [id number]     Arrival date & time 05/08/15  0845 History   First MD Initiated Contact with Patient 05/08/15 [phone]     Chief Complaint  Patient presents with  . Weakness     (Consider location/radiation/quality/duration/timing/severity/associated sxs/prior Treatment) HPI Patient with vomiting one episode this morning and diarrhea one episode this morning. Other associated symptoms include generalized weakness since this morning. He's had a minimal cough for 1 week. He had a fever one week ago which has since resolved. He denies any shortness of breath. No other associated symptoms. Lightheadedness worse with standing. Improved with lying down Denies abdominal pain denies headache denies chest pain. Denies shortness of breath. No other associated symptoms and no hematemesis no blood per rectum Past Medical History  Diagnosis Date  . Leukopenia 09/07/2013  . Thrombocytopenia, unspecified (HCC) 09/07/2013  . Systemic lupus (HCC)   . Hypertension   . Childhood asthma   . Migraine     "@ least 2-3 times/wk now" (02/10/2015)  . RA (rheumatoid arthritis) (HCC)     "feet, fingers, knees; it's from my lupus" (02/10/2015)  . Chronic back pain   . Anxiety   . Depression   . Stroke-like symptoms 02/09/2015-02/10/2015    "right-left"   Past Surgical History  Procedure Laterality Date  . Tonsillectomy and adenoidectomy  2007   No family history on file. Social History  Substance Use Topics  . Smoking status: Former Smoker -- 2.00 packs/day for 3 years    Types: Cigars, Cigarettes    Start date: 04/09/2010    Quit date: 01/06/2014  . Smokeless tobacco: Never Used     Comment: 02/10/2015 "quit smoking over 1 yr ago'  . Alcohol Use: No    Review of Systems  Constitutional: Positive for unexpected weight change.       60 pound weight loss over 6 months  Gastrointestinal: Positive for vomiting and diarrhea.  Skin: Positive for rash.       Rash for several months, hair loss over the past   Allergic/Immunologic: Positive for immunocompromised state.  Neurological: Positive for weakness.  All other systems reviewed and are negative.     Allergies  Promethazine  Home Medications   Prior to Admission medications   Medication Sig Start Date End Date Taking? Authorizing Provider  amoxicillin-clavulanate (AUGMENTIN) 875-125 MG tablet Take 1 tablet by mouth 2 (two) times daily. Patient not taking: Reported on 05/02/2015 04/30/15   Jacinta Shoe V, MD  aspirin EC 81 MG EC tablet Take 1 tablet (81 mg total) by mouth daily. 02/12/15   Leroy Sea, MD  Cholecalciferol (VITAMIN D3) 2000 UNITS capsule Take 1 capsule (2,000 Units total) by mouth daily. 12/28/14   Aleksei Plotnikov V, MD  cyanocobalamin (COBAL-1000) 1000 MCG/ML injection Inject 1 mL (1,000 mcg total) into the muscle every 14 (fourteen) days. 03/02/15   Aleksei Plotnikov V, MD  hydroxychloroquine (PLAQUENIL) 200 MG tablet Take 1 tablet by mouth daily. 04/13/15   Historical Provider, MD  losartan (COZAAR) 100 MG tablet Take 1 tablet (100 mg total) by mouth daily. Patient taking differently: Take 100 mg by mouth daily as needed (high blood pressure).  02/03/15   Aleksei Plotnikov V, MD  predniSONE (DELTASONE) 5 MG tablet Take 1 tablet by mouth daily. 04/13/15   Historical Provider, MD  promethazine (PHENERGAN) 12.5 MG tablet Take 1 tablet (12.5 mg total) by mouth every 6 (six) hours as needed for nausea or vomiting. Patient not taking: Reported on 05/02/2015 04/29/15  Aleksei Plotnikov V, MD  ranitidine (ZANTAC) 300 MG capsule Take 1 capsule (300 mg total) by mouth every evening. 04/29/15   Aleksei Plotnikov V, MD  Syringe/Needle, Disp, (B-D ECLIPSE SYRINGE) 30G X 1/2" 1 ML MISC 1 each by Does not apply route 1 day or 1 dose. For B12 injections 03/02/15   Aleksei Plotnikov V, MD   BP 132/81 mmHg  Pulse 95  Temp(Src) 99.6 F (37.6 C) (Oral)  Resp 20  Wt 136 lb (61.689 kg)  SpO2 98% Physical Exam  Constitutional: He  appears well-developed and well-nourished.  HENT:  Head: Normocephalic and atraumatic.  Mucous membranes dry  Eyes: Conjunctivae are normal. Pupils are equal, round, and reactive to light.  Neck: Neck supple. No tracheal deviation present. No thyromegaly present.  Cardiovascular: Normal rate and regular rhythm.   No murmur heard. Pulmonary/Chest: Effort normal and breath sounds normal.  Abdominal: Soft. Bowel sounds are normal. He exhibits no distension. There is no tenderness.  Musculoskeletal: Normal range of motion. He exhibits no edema or tenderness.  Neurological: He is alert. Coordination normal.  Skin: Skin is warm and dry. Rash noted.  Butterfly rash on face and pinkish rash on extremities, nontender  Psychiatric: He has a normal mood and affect.  Nursing note and vitals reviewed.   ED Course  Procedures (including critical care time) Labs Review Labs Reviewed  BASIC METABOLIC PANEL  CBC  URINALYSIS, ROUTINE W REFLEX MICROSCOPIC (NOT AT [facility])    Imaging Review No results found. I have personally reviewed and evaluated these images and lab results as part of my medical decision-making.   EKG Interpretation None     12:10 PM feels improved at with intravenous hydration. Results for orders placed or performed during the hospital encounter of 05/08/15  Basic metabolic panel  Result Value Ref Range   Sodium 134 (L) 135 - 145 mmol/L   Potassium 4.3 3.5 - 5.1 mmol/L   Chloride 99 (L) 101 - 111 mmol/L   CO2 25 22 - 32 mmol/L   Glucose, Bld 88 65 - 99 mg/dL   BUN 14 6 - 20 mg/dL   Creatinine, Ser 7.25 0.61 - 1.24 mg/dL   Calcium 8.4 (L) 8.9 - 10.3 mg/dL   GFR calc non Af Amer >60 >60 mL/min   GFR calc Af Amer >60 >60 mL/min   Anion gap 10 5 - 15  CBC  Result Value Ref Range   WBC 1.7 (L) 4.0 - 10.5 K/uL   RBC 3.78 (L) 4.22 - 5.81 MIL/uL   Hemoglobin 10.8 (L) 13.0 - 17.0 g/dL   HCT 36.6 (L) 44.0 - 34.7 %   MCV 87.8 78.0 - 100.0 fL   MCH 28.6 26.0 - 34.0 pg    MCHC 32.5 30.0 - 36.0 g/dL   RDW 42.5 95.6 - 38.7 %   Platelets 21 (LL) 150 - 400 K/uL  Urinalysis, Routine w reflex microscopic (not at [facility])  Result Value Ref Range   Color, Urine YELLOW YELLOW   APPearance CLEAR CLEAR   Specific Gravity, Urine 1.004 (L) 1.005 - 1.030   pH 6.5 5.0 - 8.0   Glucose, UA NEGATIVE NEGATIVE mg/dL   Hgb urine dipstick MODERATE (A) NEGATIVE   Bilirubin Urine NEGATIVE NEGATIVE   Ketones, ur NEGATIVE NEGATIVE mg/dL   Protein, ur 30 (A) NEGATIVE mg/dL   Nitrite NEGATIVE NEGATIVE   Leukocytes, UA NEGATIVE NEGATIVE  Urine microscopic-add on  Result Value Ref Range   Squamous Epithelial / LPF 0-5 (A)  NONE SEEN   WBC, UA 0-5 0 - 5 WBC/hpf   RBC / HPF 0-5 0 - 5 RBC/hpf   Bacteria, UA FEW (A) NONE SEEN  Differential  Result Value Ref Range   Neutrophils Relative % 53 %   Lymphocytes Relative 27 %   Monocytes Relative 4 %   Eosinophils Relative 0 %   Basophils Relative 0 %   Band Neutrophils 5 %   Metamyelocytes Relative 2 %   Myelocytes 6 %   Promyelocytes Absolute 3 %   Blasts 0 %   nRBC 0 0 /100 WBC   Other 0 %   Neutro Abs 1.1 (L) 1.7 - 7.7 K/uL   Lymphs Abs 0.5 (L) 0.7 - 4.0 K/uL   Monocytes Absolute 0.1 0.1 - 1.0 K/uL   Eosinophils Absolute 0.0 0.0 - 0.7 K/uL   Basophils Absolute 0.0 0.0 - 0.1 K/uL   RBC Morphology TEARDROP CELLS    WBC Morphology      MODERATE LEFT SHIFT (>5% METAS AND MYELOS,OCC PRO NOTED)   Dg Chest 2 View  05/02/2015  CLINICAL DATA:  Fever, rash, and itching tonight. Chest pain. Pain with swallowing. EXAM: CHEST  2 VIEW COMPARISON:  02/10/2015 FINDINGS: Slightly shallow inspiration. The heart size and mediastinal contours are within normal limits. Both lungs are clear. The visualized skeletal structures are unremarkable. IMPRESSION: No active cardiopulmonary disease. Electronically Signed   By: Burman Nieves M.D.   On: 05/02/2015 00:47    MDM  Case discussed with Dr.Magrinat. No further treatment needed today. He will  be called by hematology-oncology office to be seen in the office within the next 48 hours. Thrombocytopenia markedly worse than 6 days ago. Plan encourage oral hydration, prescription Reglan Diagnosis #1 nausea vomiting and diarrhea #2 mild dehydration #3 anemia #4 thrombocytopenia #5 leukopenia Final diagnoses:  None        Doug Sou, MD 05/08/15 [id number]  Doug Sou, MD 05/08/15 1229

## 2015-05-10 ENCOUNTER — Emergency Department (HOSPITAL_COMMUNITY): Payer: BLUE CROSS/BLUE SHIELD

## 2015-05-10 ENCOUNTER — Encounter (HOSPITAL_COMMUNITY): Payer: Self-pay | Admitting: *Deleted

## 2015-05-10 DIAGNOSIS — Z87891 Personal history of nicotine dependence: Secondary | ICD-10-CM | POA: Diagnosis not present

## 2015-05-10 DIAGNOSIS — M329 Systemic lupus erythematosus, unspecified: Secondary | ICD-10-CM | POA: Insufficient documentation

## 2015-05-10 DIAGNOSIS — J45909 Unspecified asthma, uncomplicated: Secondary | ICD-10-CM | POA: Diagnosis not present

## 2015-05-10 DIAGNOSIS — Z79899 Other long term (current) drug therapy: Secondary | ICD-10-CM | POA: Diagnosis not present

## 2015-05-10 DIAGNOSIS — G8929 Other chronic pain: Secondary | ICD-10-CM | POA: Diagnosis not present

## 2015-05-10 DIAGNOSIS — K121 Other forms of stomatitis: Secondary | ICD-10-CM | POA: Insufficient documentation

## 2015-05-10 DIAGNOSIS — G43909 Migraine, unspecified, not intractable, without status migrainosus: Secondary | ICD-10-CM | POA: Diagnosis not present

## 2015-05-10 DIAGNOSIS — Z8659 Personal history of other mental and behavioral disorders: Secondary | ICD-10-CM | POA: Diagnosis not present

## 2015-05-10 DIAGNOSIS — Z7952 Long term (current) use of systemic steroids: Secondary | ICD-10-CM | POA: Insufficient documentation

## 2015-05-10 DIAGNOSIS — Z8673 Personal history of transient ischemic attack (TIA), and cerebral infarction without residual deficits: Secondary | ICD-10-CM | POA: Insufficient documentation

## 2015-05-10 DIAGNOSIS — M069 Rheumatoid arthritis, unspecified: Secondary | ICD-10-CM | POA: Insufficient documentation

## 2015-05-10 DIAGNOSIS — R630 Anorexia: Secondary | ICD-10-CM | POA: Diagnosis not present

## 2015-05-10 DIAGNOSIS — Z7982 Long term (current) use of aspirin: Secondary | ICD-10-CM | POA: Diagnosis not present

## 2015-05-10 DIAGNOSIS — R042 Hemoptysis: Secondary | ICD-10-CM | POA: Diagnosis present

## 2015-05-10 DIAGNOSIS — I10 Essential (primary) hypertension: Secondary | ICD-10-CM | POA: Insufficient documentation

## 2015-05-10 DIAGNOSIS — Z862 Personal history of diseases of the blood and blood-forming organs and certain disorders involving the immune mechanism: Secondary | ICD-10-CM | POA: Diagnosis not present

## 2015-05-10 LAB — PATHOLOGIST SMEAR REVIEW

## 2015-05-10 NOTE — ED Notes (Signed)
Pt. reports coughing up bloody phlegm this evening with mild low abdominal pain with nausea , denies emesis or diarrhea .

## 2015-05-11 ENCOUNTER — Emergency Department (HOSPITAL_COMMUNITY): Payer: BLUE CROSS/BLUE SHIELD

## 2015-05-11 ENCOUNTER — Emergency Department (HOSPITAL_COMMUNITY)
Admission: EM | Admit: 2015-05-11 | Discharge: 2015-05-11 | Disposition: A | Discharge status: Home / Self Care | Payer: BLUE CROSS/BLUE SHIELD | Attending: Emergency Medicine | Admitting: Emergency Medicine

## 2015-05-11 DIAGNOSIS — R042 Hemoptysis: Secondary | ICD-10-CM

## 2015-05-11 DIAGNOSIS — K121 Other forms of stomatitis: Secondary | ICD-10-CM

## 2015-05-11 LAB — URINE MICROSCOPIC-ADD ON

## 2015-05-11 LAB — CBC
HEMATOCRIT: 35.8 % — AB (ref 39.0–52.0)
Hemoglobin: 11.9 g/dL — ABNORMAL LOW (ref 13.0–17.0)
MCH: 29.1 pg (ref 26.0–34.0)
MCHC: 33.2 g/dL (ref 30.0–36.0)
MCV: 87.5 fL (ref 78.0–100.0)
Platelets: 124 10*3/uL — ABNORMAL LOW (ref 150–400)
RBC: 4.09 MIL/uL — ABNORMAL LOW (ref 4.22–5.81)
RDW: 12.7 % (ref 11.5–15.5)
WBC: 1.6 10*3/uL — ABNORMAL LOW (ref 4.0–10.5)

## 2015-05-11 LAB — COMPREHENSIVE METABOLIC PANEL
ALBUMIN: 3.1 g/dL — AB (ref 3.5–5.0)
ALK PHOS: 37 U/L — AB (ref 38–126)
ALT: 24 U/L (ref 17–63)
AST: 58 U/L — ABNORMAL HIGH (ref 15–41)
Anion gap: 10 (ref 5–15)
BUN: 9 mg/dL (ref 6–20)
CALCIUM: 8.4 mg/dL — AB (ref 8.9–10.3)
CO2: 25 mmol/L (ref 22–32)
CREATININE: 1.03 mg/dL (ref 0.61–1.24)
Chloride: 99 mmol/L — ABNORMAL LOW (ref 101–111)
GFR calc Af Amer: >60 mL/min (ref >60)
GFR calc non Af Amer: >60 mL/min (ref >60)
GLUCOSE: 108 mg/dL — AB (ref 65–99)
Potassium: 3.6 mmol/L (ref 3.5–5.1)
SODIUM: 134 mmol/L — AB (ref 135–145)
Total Bilirubin: 0.4 mg/dL (ref 0.3–1.2)
Total Protein: 6.8 g/dL (ref 6.5–8.1)

## 2015-05-11 LAB — URINALYSIS, ROUTINE W REFLEX MICROSCOPIC
BILIRUBIN URINE: NEGATIVE
Glucose, UA: NEGATIVE mg/dL
Ketones, ur: NEGATIVE mg/dL
Leukocytes, UA: NEGATIVE
NITRITE: NEGATIVE
PH: 6 (ref 5.0–8.0)
Protein, ur: >300 mg/dL — AB
SPECIFIC GRAVITY, URINE: 1.03 (ref 1.005–1.030)

## 2015-05-11 LAB — HEMOGLOBIN AND HEMATOCRIT, BLOOD
HEMATOCRIT: 32 % — AB (ref 39.0–52.0)
Hemoglobin: 10.6 g/dL — ABNORMAL LOW (ref 13.0–17.0)

## 2015-05-11 LAB — I-STAT CG4 LACTIC ACID, ED: LACTIC ACID, VENOUS: 0.81 mmol/L (ref 0.5–2.0)

## 2015-05-11 LAB — RAPID STREP SCREEN (MED CTR MEBANE ONLY): STREPTOCOCCUS, GROUP A SCREEN (DIRECT): NEGATIVE

## 2015-05-11 MED ORDER — MAGIC MOUTHWASH: 5.0000 mL | Freq: Three times a day (TID) | ORAL | Status: DC | PRN

## 2015-05-11 MED ORDER — ACETAMINOPHEN 325 MG PO TABS
650.0000 mg | ORAL_TABLET | Freq: Once | ORAL | Status: AC
  Administered 2015-05-11: 650 mg via ORAL
  Filled 2015-05-11: qty 2

## 2015-05-11 MED ORDER — MORPHINE SULFATE (PF) 4 MG/ML IV SOLN
4.0000 mg | Freq: Once | INTRAVENOUS | Status: AC
  Administered 2015-05-11: 4 mg via INTRAVENOUS
  Filled 2015-05-11: qty 1

## 2015-05-11 MED ORDER — OXYCODONE-ACETAMINOPHEN 5-325 MG PO TABS: 1.0000 | ORAL_TABLET | Freq: Once | ORAL | Status: DC

## 2015-05-11 MED ORDER — OMEPRAZOLE 20 MG PO CPDR: 20.0000 mg | DELAYED_RELEASE_CAPSULE | Freq: Every day | ORAL | Status: DC

## 2015-05-11 MED ORDER — MAGIC MOUTHWASH
5.0000 mL | Freq: Once | ORAL | Status: AC
  Administered 2015-05-11: 5 mL via ORAL
  Filled 2015-05-11: qty 5

## 2015-05-11 MED ORDER — IOHEXOL 350 MG/ML SOLN
100.0000 mL | Freq: Once | INTRAVENOUS | Status: AC | PRN
  Administered 2015-05-11: 100 mL via INTRAVENOUS

## 2015-05-11 MED ORDER — SODIUM CHLORIDE 0.9 % IV BOLUS (SEPSIS)
1000.0000 mL | Freq: Once | INTRAVENOUS | Status: AC
  Administered 2015-05-11: 1000 mL via INTRAVENOUS

## 2015-05-11 MED ORDER — ACETAMINOPHEN 325 MG PO TABS: 325.0000 mg | ORAL_TABLET | Freq: Once | ORAL | Status: DC

## 2015-05-11 MED ORDER — ONDANSETRON HCL 4 MG/2ML IJ SOLN
4.0000 mg | Freq: Once | INTRAMUSCULAR | Status: AC
  Administered 2015-05-11: 4 mg via INTRAVENOUS
  Filled 2015-05-11: qty 2

## 2015-05-11 NOTE — ED Provider Notes (Addendum)
CSN: [id number]     Arrival date & time 05/10/15  2313 History  By signing my name below, I, Dan Adams, attest that this documentation has been prepared under the direction and in the presence of Dan Baton, MD. Electronically Signed: [name], ED Scribe. 05/11/2015. 3:52 AM   Chief Complaint  Patient presents with  . Hemoptysis  . Abdominal Pain   The history is provided by the patient. No language interpreter was used.   Dan Adams is a 25 y.o. male with PMHx of lupus, rheumatoid arthritis, and HTN who presents to the Emergency Department complaining of hemoptysis with onset last night  Near 11 PM. Pt states that he drank red Gatorade and then coughed up a string of dark red blood.  His mother states that he has had a cough for 3-4 weeks. Also complains of decreased appetite, nausea, and 7/10 in severity lower abdominal pain. He took nothing for pain at home and it is not worse with eating. He was seen in the ED 3 days ago for nausea and diarrhea but notes that he was feeling better until last night. The pt reportedly had a CT A/P this week at [facility] but has not received the results.  No recent fever. Pt denies dark or bloody stool. Denies alcohol use, peptic ulcer disease, and GERD   Past Medical History  Diagnosis Date  . Leukopenia 09/07/2013  . Thrombocytopenia, unspecified (HCC) 09/07/2013  . Systemic lupus (HCC)   . Hypertension   . Childhood asthma   . Migraine     "@ least 2-3 times/wk now" (02/10/2015)  . RA (rheumatoid arthritis) (HCC)     "feet, fingers, knees; it's from my lupus" (02/10/2015)  . Chronic back pain   . Anxiety   . Depression   . Stroke-like symptoms 02/09/2015-02/10/2015    "right-left"   Past Surgical History  Procedure Laterality Date  . Tonsillectomy and adenoidectomy  2007   No family history on file. Social History  Substance Use Topics  . Smoking status: Former Smoker -- 2.00 packs/day for 3 years    Types: Cigars,  Cigarettes    Start date: 04/09/2010    Quit date: 01/06/2014  . Smokeless tobacco: Never Used     Comment: 02/10/2015 "quit smoking over 1 yr ago'  . Alcohol Use: No    Review of Systems  Constitutional: Positive for appetite change. Negative for fever.  Respiratory: Positive for cough (with 1 episode of hemoptysis).   Gastrointestinal: Positive for nausea, vomiting and abdominal pain. Negative for diarrhea and blood in stool.  All other systems reviewed and are negative.   Allergies  Promethazine  Home Medications   Prior to Admission medications   Medication Sig Start Date End Date Taking? Authorizing Provider  aspirin EC 81 MG EC tablet Take 1 tablet (81 mg total) by mouth daily. 02/12/15  Yes Leroy Sea, MD  Cholecalciferol (VITAMIN D3) 2000 UNITS capsule Take 1 capsule (2,000 Units total) by mouth daily. 12/28/14  Yes Aleksei Plotnikov V, MD  cyanocobalamin (COBAL-1000) 1000 MCG/ML injection Inject 1 mL (1,000 mcg total) into the muscle every 14 (fourteen) days. 03/02/15  Yes Aleksei Plotnikov V, MD  hydroxychloroquine (PLAQUENIL) 200 MG tablet Take 1 tablet by mouth daily. 04/13/15  Yes Historical Provider, MD  losartan (COZAAR) 100 MG tablet Take 1 tablet (100 mg total) by mouth daily. Patient taking differently: Take 100 mg by mouth daily as needed (high blood pressure).  02/03/15  Yes Aleksei Plotnikov V,  MD  metoCLOPramide (REGLAN) 10 MG tablet Take 1 tablet (10 mg total) by mouth every 6 (six) hours as needed for nausea (nausea/headache). 05/08/15  Yes Doug Sou, MD  predniSONE (DELTASONE) 5 MG tablet Take 1 tablet by mouth daily. 04/13/15  Yes Historical Provider, MD  ranitidine (ZANTAC) 300 MG capsule Take 1 capsule (300 mg total) by mouth every evening. 04/29/15  Yes Aleksei Plotnikov V, MD  amoxicillin-clavulanate (AUGMENTIN) 875-125 MG tablet Take 1 tablet by mouth 2 (two) times daily. Patient not taking: Reported on 05/02/2015 04/30/15   Tresa Garter, MD   magic mouthwash SOLN Take 5 mLs by mouth 3 (three) times daily as needed for mouth pain. 05/11/15   Dan Baton, MD  promethazine (PHENERGAN) 12.5 MG tablet Take 1 tablet (12.5 mg total) by mouth every 6 (six) hours as needed for nausea or vomiting. Patient not taking: Reported on 05/02/2015 04/29/15   Tresa Garter, MD  Syringe/Needle, Disp, (B-D ECLIPSE SYRINGE) 30G X 1/2" 1 ML MISC 1 each by Does not apply route 1 day or 1 dose. For B12 injections 03/02/15   Aleksei Plotnikov V, MD   BP 119/81 mmHg  Pulse 97  Temp(Src) 100.9 F (38.3 C) (Oral)  Resp 24  SpO2 99% Physical Exam  Constitutional: He is oriented to person, place, and time.  Chronically ill-appearing, no acute distress  HENT:  Butterfly rash noted, mucous membranes moist, oropharynx with a small amount of blood noted in the posterior oropharynx, there is an ulcerative lesion over the soft tissues where the left tonsil would have been, small less than 1 cm clot noted, no active bleeding, nares patent, no evidence of nasal bleeding  Neck: Neck supple.  Cardiovascular: Normal rate, regular rhythm and normal heart sounds.   No murmur heard. Pulmonary/Chest: Effort normal and breath sounds normal. No respiratory distress. He has no wheezes.  Abdominal: Soft. Bowel sounds are normal. There is no tenderness. There is no rebound and no guarding.  Musculoskeletal: He exhibits no edema.  Neurological: He is alert and oriented to person, place, and time.  Skin: Skin is warm and dry.    ED Course  Procedures (including critical care time) DIAGNOSTIC STUDIES: Oxygen Saturation is 99% on RA,  normal by my interpretation.    COORDINATION OF CARE: 2:04 AM Discussed treatment plan which includes lab work and CXR with pt at bedside and pt agreed to plan.  Labs Review Labs Reviewed  COMPREHENSIVE METABOLIC PANEL - Abnormal; Notable for the following:    Sodium 134 (*)    Chloride 99 (*)    Glucose, Bld 108 (*)    Calcium  8.4 (*)    Albumin 3.1 (*)    AST 58 (*)    Alkaline Phosphatase 37 (*)    All other components within normal limits  CBC - Abnormal; Notable for the following:    WBC 1.6 (*)    RBC 4.09 (*)    Hemoglobin 11.9 (*)    HCT 35.8 (*)    Platelets 124 (*)    All other components within normal limits  URINALYSIS, ROUTINE W REFLEX MICROSCOPIC (NOT AT [facility]) - Abnormal; Notable for the following:    Hgb urine dipstick MODERATE (*)    Protein, ur >300 (*)    All other components within normal limits  URINE MICROSCOPIC-ADD ON - Abnormal; Notable for the following:    Squamous Epithelial / LPF 0-5 (*)    Bacteria, UA RARE (*)    Casts  HYALINE CASTS (*)    All other components within normal limits  RAPID STREP SCREEN (NOT AT [facility])  CULTURE, GROUP A STREP ([facility])  HEMOGLOBIN AND HEMATOCRIT, BLOOD  I-STAT CG4 LACTIC ACID, ED  I-STAT CG4 LACTIC ACID, ED    Imaging Review Dg Chest 2 View  05/11/2015  CLINICAL DATA:  Hemoptysis.  Nausea and weakness for 3 days. EXAM: CHEST  2 VIEW COMPARISON:  05/02/2015 FINDINGS: The cardiomediastinal contours are normal. The lungs are clear. Pulmonary vasculature is normal. No consolidation, pleural effusion, or pneumothorax. No acute osseous abnormalities are seen. IMPRESSION: No acute pulmonary process. Electronically Signed   By: Rubye Oaks M.D.   On: 05/11/2015 00:26   Ct Angio Chest Pe W/cm &/or Wo Cm  05/11/2015  CLINICAL DATA:  Cough and hemoptysis. Patient with history of lupus. EXAM: CT ANGIOGRAPHY CHEST WITH CONTRAST TECHNIQUE: Multidetector CT imaging of the chest was performed using the standard protocol during bolus administration of intravenous contrast. Multiplanar CT image reconstructions and MIPs were obtained to evaluate the vascular anatomy. CONTRAST:  OMNIPAQUE IOHEXOL 350 MG/ML SOLN COMPARISON:  Radiographs earlier this day. FINDINGS: There are no filling defects within the pulmonary arteries to suggest pulmonary embolus. Heart is  normal in size. Left vertebral artery arises directly from the thoracic aorta, normal variant. No mediastinal or hilar adenopathy. No pleural or pericardial effusion. Clear lungs. No consolidation, pulmonary edema or findings to suggest pulmonary hemorrhage. No pulmonary nodule or mass. Esophagus is decompressed. There is bilateral axillary adenopathy, largest lymph nodes measuring 10 mm short axis bilaterally. No acute abnormality in the included upper abdomen. There are no acute or suspicious osseous abnormalities. Review of the MIP images confirms the above findings. IMPRESSION: 1. No pulmonary embolus or acute intrathoracic process. 2. Bilateral axillary adenopathy, nonspecific but may be secondary to autoimmune process such as lupus. Electronically Signed   By: Rubye Oaks M.D.   On: 05/11/2015 03:32   I have personally reviewed and evaluated these images and lab results as part of my medical decision-making.   EKG Interpretation None      MDM   Final diagnoses:  Mouth ulcer   Patient presents with reported hemoptysis. After further reporting. It appears he is coughing and spitting out blood. Denies any shortness of breath. Was recently seen and evaluated for abdominal pain. Mother reports that he had a CT outpatient and does not know the results of this. He is continuing to have these complaints as well. He is nontoxic on exam.  Chronically ill-appearing.  Of note, after initial history and physical, patient reported to the nurse that he had an ulcerative lesion in his mouth. On repeat exam, he is noted to have an ulcerative lesion with evidence of recent bleeding.  Status post tonsillectomy when he was 25 years old. Ulcer is over the tissues where tonsil would've been.  6:15 AM  No recurrent bleeding. Lab work is reassuring. Initially patient was worked up for possible PE given hemoptysis. This was negative. I feel it is more likely he is bleeding from this small ulcer in his mouth  and spitting up blood from that. Hemoglobin is stable. Platelets improved. CT scan as an outpatient was reviewed and is negative. Some of his complaints appear chronic. We'll have him follow-up with his primary physician. We'll have him swish and spit Magic mouthwash. Follow-up with ENT if bleeding recurs.  After history, exam, and medical workup I feel the patient has been appropriately medically screened and is safe  for discharge home. Pertinent diagnoses were discussed with the patient. Patient was given return precautions.  I personally performed the services described in this documentation, which was scribed in my presence. The recorded information has been reviewed and is accurate.    Dan Baton, MD 05/11/15 [phone]  Patient is concerned that he is bleeding from the stomach. Repeat H&H is 10.6.  Patient's baseline over the last several days has been between 10.5 and 12. There is no trending downward. I have low suspicion at this time for GI component. He's had extensive testing. I have monitored him in the ER for several hours and he has had no significant vomiting. He does have evidence of this ulcer in his mouth and what I have witnessed has been spitting blood-tinged spit. Feel this is more likely. Patient and his mother were given strict return precautions. He was given a rx for omeprazole and GI follow-up giving ongoing pain.  Dan Baton, MD 05/11/15 0800

## 2015-05-11 NOTE — Discharge Instructions (Signed)
°  After further evaluation it appears that you are bleeding from a ulcer in your mouth. Magic mouthwash 3 times a day as needed. If you develop new or worsening bleeding he needs to be reevaluated.  Hemoptysis Hemoptysis, which means coughing up blood, can be a sign of a minor problem or a serious medical condition. The blood that is coughed up may come from the lungs and airways. Coughed-up blood can also come from bleeding that occurs outside the lungs and airways. Blood can drain into the windpipe during a severe nosebleed or when blood is vomited from the stomach. Because hemoptysis can be a sign of something serious, a medical evaluation is required. For some people with hemoptysis, no definite cause is ever identified. CAUSES  The most common cause of hemoptysis is bronchitis. Some other common causes include:   A ruptured blood vessel caused by coughing or an infection.   A medical condition that causes damage to the large air passageways (bronchiectasis).   A blood clot in the lungs (pulmonary embolism).   Pneumonia.   Tuberculosis.   Breathing in a small foreign object.   Cancer. For some people with hemoptysis, no definite cause is ever identified.  HOME CARE INSTRUCTIONS  Only take over-the-counter or prescription medicines as directed by your caregiver. Do not use cough suppressants unless your caregiver approves.  If your caregiver prescribes antibiotic medicines, take them as directed. Finish them even if you start to feel better.  Do not smoke. Also avoid secondhand smoke.  Follow up with your caregiver as directed. SEEK IMMEDIATE MEDICAL CARE IF:   You cough up bloody mucus for longer than a week.  You have a blood-producing cough that is severe or getting worse.  You have a blood-producing cough thatcomes and goes over time.  You develop problems with your breathing.   You vomit blood.  You develop bloody or black-colored stools.  You have chest  pain.   You develop night sweats.  You feel faint or pass out.   You have a fever or persistent symptoms for more than 2-3 days.  You have a fever and your symptoms suddenly get worse. MAKE SURE YOU:  Understand these instructions.  Will watch your condition.  Will get help right away if you are not doing well or get worse.   This information is not intended to replace advice given to you by your health care provider. Make sure you discuss any questions you have with your health care provider.   Document Released: 06/04/2001 Document Revised: 03/12/2012 Document Reviewed: 01/11/2012 Elsevier Interactive Patient Education 2016 [employer]. You were seen today for coughing up blood.

## 2015-05-13 LAB — CULTURE, GROUP A STREP (THRC)

## 2015-05-25 ENCOUNTER — Inpatient Hospital Stay: Payer: BLUE CROSS/BLUE SHIELD | Admitting: Internal Medicine

## 2015-05-30 ENCOUNTER — Ambulatory Visit: Payer: BLUE CROSS/BLUE SHIELD | Admitting: Internal Medicine

## 2015-06-28 ENCOUNTER — Ambulatory Visit: Payer: BLUE CROSS/BLUE SHIELD | Admitting: Physician Assistant

## 2015-06-28 ENCOUNTER — Telehealth: Payer: Self-pay | Admitting: Internal Medicine

## 2015-06-28 NOTE — Telephone Encounter (Signed)
Pt lvm at 12:17 to cancel appt at 3:15.

## 2015-06-29 NOTE — Telephone Encounter (Signed)
No charge from me since he called.

## 2015-06-29 NOTE — Telephone Encounter (Signed)
Pt was no show 06/28/15 3:15pm, pt left VM at 12:17pm to cancel appt with Piedad Climes, PA at [facility], charge or no charge [place]?  Including Dr. Posey Rea since pt has multiple no shows. I am not sure if [facility] follows the same policy as [facility]. 5 no shows since 02/22/15 (4 with Dr. Posey Rea).

## 2015-08-10 ENCOUNTER — Ambulatory Visit (INDEPENDENT_AMBULATORY_CARE_PROVIDER_SITE_OTHER): Payer: BLUE CROSS/BLUE SHIELD | Admitting: Internal Medicine

## 2015-08-10 ENCOUNTER — Encounter: Payer: Self-pay | Admitting: Internal Medicine

## 2015-08-10 VITALS — BP 160/96 | HR 98 | Wt 155.0 lb

## 2015-08-10 DIAGNOSIS — K121 Other forms of stomatitis: Secondary | ICD-10-CM

## 2015-08-10 DIAGNOSIS — M329 Systemic lupus erythematosus, unspecified: Secondary | ICD-10-CM

## 2015-08-10 DIAGNOSIS — E538 Deficiency of other specified B group vitamins: Secondary | ICD-10-CM

## 2015-08-10 DIAGNOSIS — I159 Secondary hypertension, unspecified: Secondary | ICD-10-CM | POA: Diagnosis not present

## 2015-08-10 DIAGNOSIS — E559 Vitamin D deficiency, unspecified: Secondary | ICD-10-CM

## 2015-08-10 MED ORDER — AZILSARTAN MEDOXOMIL 80 MG PO TABS: 1.0000 | ORAL_TABLET | Freq: Every day | ORAL | Status: DC

## 2015-08-10 NOTE — Assessment & Plan Note (Signed)
On Vit B12 sq

## 2015-08-10 NOTE — Assessment & Plan Note (Signed)
Vit D 

## 2015-08-10 NOTE — Progress Notes (Signed)
Subjective:  Patient ID: Dan Adams, male    DOB: [birth year]  Age: 25 y.o. MRN: [id number]  CC: No chief complaint on file.   HPI Dan Adams presents for HTN, lupus, rash f/u. He is s/p kidney bx at [facility]. Doing better  Outpatient Prescriptions Prior to Visit  Medication Sig Dispense Refill  . aspirin EC 81 MG EC tablet Take 1 tablet (81 mg total) by mouth daily. 30 tablet 0  . Cholecalciferol (VITAMIN D3) 2000 UNITS capsule Take 1 capsule (2,000 Units total) by mouth daily. 100 capsule 3  . cyanocobalamin (COBAL-1000) 1000 MCG/ML injection Inject 1 mL (1,000 mcg total) into the muscle every 14 (fourteen) days. 10 mL 6  . hydroxychloroquine (PLAQUENIL) 200 MG tablet Take 1 tablet by mouth daily.  0  . losartan (COZAAR) 100 MG tablet Take 1 tablet (100 mg total) by mouth daily. (Patient taking differently: Take 100 mg by mouth daily as needed (high blood pressure). ) 30 tablet 11  . predniSONE (DELTASONE) 5 MG tablet Take 1 tablet by mouth daily.  0  . Syringe/Needle, Disp, (B-D ECLIPSE SYRINGE) 30G X 1/2" 1 ML MISC 1 each by Does not apply route 1 day or 1 dose. For B12 injections 50 each 11  . magic mouthwash SOLN Take 5 mLs by mouth 3 (three) times daily as needed for mouth pain. (Patient not taking: Reported on 08/10/2015) 60 mL 0  . metoCLOPramide (REGLAN) 10 MG tablet Take 1 tablet (10 mg total) by mouth every 6 (six) hours as needed for nausea (nausea/headache). (Patient not taking: Reported on 08/10/2015) 6 tablet 0  . omeprazole (PRILOSEC) 20 MG capsule Take 1 capsule (20 mg total) by mouth daily. (Patient not taking: Reported on 08/10/2015) 30 capsule 0  . promethazine (PHENERGAN) 12.5 MG tablet Take 1 tablet (12.5 mg total) by mouth every 6 (six) hours as needed for nausea or vomiting. (Patient not taking: Reported on 08/10/2015) 30 tablet 0  . ranitidine (ZANTAC) 300 MG capsule Take 1 capsule (300 mg total) by mouth every evening. (Patient not taking: Reported on 08/10/2015) 30  capsule 11  . amoxicillin-clavulanate (AUGMENTIN) 875-125 MG tablet Take 1 tablet by mouth 2 (two) times daily. (Patient not taking: Reported on 08/10/2015) 20 tablet 0   No facility-administered medications prior to visit.    ROS Review of Systems  Constitutional: Negative for fever, chills, appetite change, fatigue and unexpected weight change.  HENT: Negative for congestion, nosebleeds, sneezing, sore throat and trouble swallowing.   Eyes: Negative for itching and visual disturbance.  Respiratory: Negative for cough.   Cardiovascular: Negative for chest pain, palpitations and leg swelling.  Gastrointestinal: Negative for nausea, vomiting, diarrhea, blood in stool and abdominal distention.  Genitourinary: Negative for frequency, hematuria and genital sores.  Musculoskeletal: Positive for arthralgias. Negative for back pain, joint swelling, gait problem and neck pain.  Skin: Negative for pallor and rash.  Neurological: Negative for dizziness, tremors, speech difficulty and weakness.  Psychiatric/Behavioral: Negative for sleep disturbance, dysphoric mood and agitation. The patient is not nervous/anxious.     Objective:  BP 160/96 mmHg  Pulse 98  Wt 155 lb (70.308 kg)  SpO2 97%  BP Readings from Last 3 Encounters:  08/10/15 160/96  05/11/15 122/79  05/08/15 132/93    Wt Readings from Last 3 Encounters:  08/10/15 155 lb (70.308 kg)  05/08/15 136 lb (61.689 kg)  05/01/15 141 lb (63.957 kg)    Physical Exam  Constitutional: He is oriented to person, place, and  time. He appears well-developed. No distress.  NAD  HENT:  Mouth/Throat: Oropharynx is clear and moist.  Eyes: Conjunctivae are normal. Pupils are equal, round, and reactive to light.  Neck: Normal range of motion. No JVD present. No thyromegaly present.  Cardiovascular: Normal rate, regular rhythm, normal heart sounds and intact distal pulses.  Exam reveals no gallop and no friction rub.   No murmur  heard. Pulmonary/Chest: Effort normal and breath sounds normal. No respiratory distress. He has no wheezes. He has no rales. He exhibits no tenderness.  Abdominal: Soft. Bowel sounds are normal. He exhibits no distension and no mass. There is no tenderness. There is no rebound and no guarding.  Musculoskeletal: Normal range of motion. He exhibits no edema or tenderness.  Lymphadenopathy:    He has no cervical adenopathy.  Neurological: He is alert and oriented to person, place, and time. He has normal reflexes. No cranial nerve deficit. He exhibits normal muscle tone. He displays a negative Romberg sign. Coordination and gait normal.  Skin: Skin is warm and dry. No rash noted.  Psychiatric: He has a normal mood and affect. His behavior is normal. Judgment and thought content normal.    Lab Results  Component Value Date   WBC 1.6* 05/11/2015   HGB 10.6* 05/11/2015   HCT 32.0* 05/11/2015   PLT 124* 05/11/2015   GLUCOSE 108* 05/11/2015   CHOL 158 02/10/2015   TRIG 148 02/10/2015   HDL 25* 02/10/2015   LDLCALC 103* 02/10/2015   ALT 24 05/11/2015   AST 58* 05/11/2015   NA 134* 05/11/2015   K 3.6 05/11/2015   CL 99* 05/11/2015   CREATININE 1.03 05/11/2015   BUN 9 05/11/2015   CO2 25 05/11/2015   TSH 1.13 08/13/2014   HGBA1C 5.2 02/10/2015    Ct Angio Chest Pe W/cm &/or Wo Cm  05/11/2015  CLINICAL DATA:  Cough and hemoptysis. Patient with history of lupus. EXAM: CT ANGIOGRAPHY CHEST WITH CONTRAST TECHNIQUE: Multidetector CT imaging of the chest was performed using the standard protocol during bolus administration of intravenous contrast. Multiplanar CT image reconstructions and MIPs were obtained to evaluate the vascular anatomy. CONTRAST:  OMNIPAQUE IOHEXOL 350 MG/ML SOLN COMPARISON:  Radiographs earlier this day. FINDINGS: There are no filling defects within the pulmonary arteries to suggest pulmonary embolus. Heart is normal in size. Left vertebral artery arises directly from the  thoracic aorta, normal variant. No mediastinal or hilar adenopathy. No pleural or pericardial effusion. Clear lungs. No consolidation, pulmonary edema or findings to suggest pulmonary hemorrhage. No pulmonary nodule or mass. Esophagus is decompressed. There is bilateral axillary adenopathy, largest lymph nodes measuring 10 mm short axis bilaterally. No acute abnormality in the included upper abdomen. There are no acute or suspicious osseous abnormalities. Review of the MIP images confirms the above findings. IMPRESSION: 1. No pulmonary embolus or acute intrathoracic process. 2. Bilateral axillary adenopathy, nonspecific but may be secondary to autoimmune process such as lupus. Electronically Signed   By: Rubye Oaks M.D.   On: 05/11/2015 03:32    Assessment & Plan:   There are no diagnoses linked to this encounter. I have discontinued Mr. Finfrock's amoxicillin-clavulanate. I am also having him maintain his Vitamin D3, losartan, aspirin, cyanocobalamin, Syringe/Needle (Disp), hydroxychloroquine, predniSONE, ranitidine, promethazine, metoCLOPramide, magic mouthwash, and omeprazole.  No orders of the defined types were placed in this encounter.     Follow-up: No Follow-up on file.  Sonda Primes, MD

## 2015-08-10 NOTE — Assessment & Plan Note (Signed)
Resolved on steroids

## 2015-08-10 NOTE — Assessment & Plan Note (Signed)
Rash resolved on steroids

## 2015-08-10 NOTE — Progress Notes (Signed)
Pre visit review using our clinic review tool, if applicable. No additional management support is needed unless otherwise documented below in the visit note. 

## 2015-08-10 NOTE — Assessment & Plan Note (Signed)
Stop Losartan - start [place]

## 2015-08-11 ENCOUNTER — Telehealth: Payer: Self-pay

## 2015-08-11 NOTE — Telephone Encounter (Signed)
PA initiated via CoverMyMes key [id number]

## 2015-08-11 NOTE — Telephone Encounter (Signed)
APPROVED through 04/08/2038.  Pharmacy advised via fax

## 2015-10-27 NOTE — Telephone Encounter (Signed)
Correct

## 2016-02-03 ENCOUNTER — Emergency Department (HOSPITAL_BASED_OUTPATIENT_CLINIC_OR_DEPARTMENT_OTHER): Payer: BLUE CROSS/BLUE SHIELD

## 2016-02-03 ENCOUNTER — Emergency Department (HOSPITAL_BASED_OUTPATIENT_CLINIC_OR_DEPARTMENT_OTHER)
Admission: EM | Admit: 2016-02-03 | Discharge: 2016-02-03 | Disposition: A | Discharge status: Home / Self Care | Payer: BLUE CROSS/BLUE SHIELD | Attending: Emergency Medicine | Admitting: Emergency Medicine

## 2016-02-03 ENCOUNTER — Encounter (HOSPITAL_BASED_OUTPATIENT_CLINIC_OR_DEPARTMENT_OTHER): Payer: Self-pay | Admitting: Emergency Medicine

## 2016-02-03 DIAGNOSIS — R079 Chest pain, unspecified: Secondary | ICD-10-CM | POA: Diagnosis present

## 2016-02-03 DIAGNOSIS — R0789 Other chest pain: Secondary | ICD-10-CM | POA: Insufficient documentation

## 2016-02-03 DIAGNOSIS — I10 Essential (primary) hypertension: Secondary | ICD-10-CM | POA: Insufficient documentation

## 2016-02-03 DIAGNOSIS — Z7982 Long term (current) use of aspirin: Secondary | ICD-10-CM | POA: Insufficient documentation

## 2016-02-03 DIAGNOSIS — Z79899 Other long term (current) drug therapy: Secondary | ICD-10-CM | POA: Insufficient documentation

## 2016-02-03 DIAGNOSIS — Z8673 Personal history of transient ischemic attack (TIA), and cerebral infarction without residual deficits: Secondary | ICD-10-CM | POA: Diagnosis not present

## 2016-02-03 DIAGNOSIS — F17290 Nicotine dependence, other tobacco product, uncomplicated: Secondary | ICD-10-CM | POA: Insufficient documentation

## 2016-02-03 LAB — CBC WITH DIFFERENTIAL/PLATELET
BASOS ABS: 0 10*3/uL (ref 0.0–0.1)
BASOS PCT: 0 %
EOS ABS: 0 10*3/uL (ref 0.0–0.7)
EOS PCT: 0 %
HCT: 37 % — ABNORMAL LOW (ref 39.0–52.0)
HEMOGLOBIN: 12.7 g/dL — AB (ref 13.0–17.0)
LYMPHS ABS: 0.2 10*3/uL — AB (ref 0.7–4.0)
Lymphocytes Relative: 4 %
MCH: 29.6 pg (ref 26.0–34.0)
MCHC: 34.3 g/dL (ref 30.0–36.0)
MCV: 86.2 fL (ref 78.0–100.0)
Monocytes Absolute: 0.3 10*3/uL (ref 0.1–1.0)
Monocytes Relative: 5 %
NEUTROS PCT: 91 %
Neutro Abs: 5 10*3/uL (ref 1.7–7.7)
PLATELETS: 207 10*3/uL (ref 150–400)
RBC: 4.29 MIL/uL (ref 4.22–5.81)
RDW: 11.6 % (ref 11.5–15.5)
WBC: 5.5 10*3/uL (ref 4.0–10.5)

## 2016-02-03 LAB — COMPREHENSIVE METABOLIC PANEL
ALBUMIN: 3.9 g/dL (ref 3.5–5.0)
ALK PHOS: 38 U/L (ref 38–126)
ALT: 9 U/L — AB (ref 17–63)
AST: 16 U/L (ref 15–41)
Anion gap: 9 (ref 5–15)
BUN: 19 mg/dL (ref 6–20)
CALCIUM: 9.1 mg/dL (ref 8.9–10.3)
CHLORIDE: 102 mmol/L (ref 101–111)
CO2: 24 mmol/L (ref 22–32)
CREATININE: 1.07 mg/dL (ref 0.61–1.24)
GFR calc non Af Amer: >60 mL/min (ref >60)
GLUCOSE: 102 mg/dL — AB (ref 65–99)
Potassium: 4 mmol/L (ref 3.5–5.1)
SODIUM: 135 mmol/L (ref 135–145)
Total Bilirubin: 0.6 mg/dL (ref 0.3–1.2)
Total Protein: 7.6 g/dL (ref 6.5–8.1)

## 2016-02-03 LAB — TROPONIN I: Troponin I: <0.03 ng/mL

## 2016-02-03 MED ORDER — TRAMADOL HCL 50 MG PO TABS: 50.0000 mg | ORAL_TABLET | Freq: Four times a day (QID) | ORAL | 0 refills | Status: DC | PRN

## 2016-02-03 MED FILL — traMADol HCL 50 MG TABS: 50 | 3 days supply | Qty: 15 | Fill #0

## 2016-02-03 NOTE — ED Notes (Signed)
MD at bedside. 

## 2016-02-03 NOTE — Discharge Instructions (Signed)
Tramadol as prescribed as needed for pain.  Follow-up with your primary Dr. if not improving in the next week, and return to the ER if symptoms significantly worsen or change.

## 2016-02-03 NOTE — ED Triage Notes (Signed)
Sharp chest pain radiating to the back since Monday, worse with inspiration. Denies N/V. Also reports productive cough.

## 2016-02-03 NOTE — ED Notes (Signed)
Patient transported to X-ray 

## 2016-02-03 NOTE — ED Provider Notes (Signed)
[facility] Provider Note   CSN: [id number] Arrival date & time: 02/03/16  1029     History   Chief Complaint Chief Complaint  Patient presents with  . Chest Pain    HPI Dan Adams is a 25 y.o. male.  Patient is a 25 year old male with past medical history of lupus, hypertension, and rheumatoid arthritis. He presents for evaluation of chest pain that has been ongoing for the past 2 days. He reports having difficulty sleeping at night as his pain worsens when he lays down. He reports worsening pain with deep breath. He feels somewhat short of breath. He denies any fevers or chills. He denies any productive cough. He has no prior cardiac history.   The history is provided by the patient.  Chest Pain   This is a new problem. The current episode started 2 days ago. The problem occurs constantly. The problem has been gradually worsening. The pain is present in the lateral region. The pain is moderate. The quality of the pain is described as sharp. The pain does not radiate. Associated symptoms include shortness of breath. Pertinent negatives include no cough, no fever, no palpitations and no sputum production. He has tried nothing for the symptoms.    Past Medical History:  Diagnosis Date  . Anxiety   . Childhood asthma   . Chronic back pain   . Depression   . Hypertension   . Leukopenia 09/07/2013  . Migraine    "@ least 2-3 times/wk now" (02/10/2015)  . RA (rheumatoid arthritis) (HCC)    "feet, fingers, knees; it's from my lupus" (02/10/2015)  . Stroke-like symptoms 02/09/2015-02/10/2015   "right-left"  . Systemic lupus (HCC)   . Thrombocytopenia, unspecified 09/07/2013    Patient Active Problem List   Diagnosis Date Noted  . Fever, unspecified 04/30/2015  . Constipation 04/29/2015  . GERD (gastroesophageal reflux disease) 04/29/2015  . Generalized anxiety disorder 03/02/2015  . TIA (transient ischemic attack) 02/10/2015  . Stroke-like symptoms 02/10/2015    . Neurologic disorder   . Non compliance with medical treatment 12/28/2014  . Mouth ulcers 12/28/2014  . Hypertension, secondary 10/13/2014  . Vitamin B12 deficiency 08/17/2014  . Vitamin D deficiency 08/17/2014  . Lupus (systemic lupus erythematosus) (HCC) 08/13/2014  . Leukopenia 09/07/2013  . Thrombocytopenia, unspecified 09/07/2013    Past Surgical History:  Procedure Laterality Date  . TONSILLECTOMY AND ADENOIDECTOMY  2007       Home Medications    Prior to Admission medications   Medication Sig Start Date End Date Taking? Authorizing Provider  aspirin EC 81 MG EC tablet Take 1 tablet (81 mg total) by mouth daily. 02/12/15  Yes Leroy Sea, MD  Azilsartan Medoxomil (EDARBI) 80 MG TABS Take 1 tablet (80 mg total) by mouth daily. Take in am. 08/10/15  Yes Tresa Garter, MD  Cholecalciferol (VITAMIN D3) 2000 UNITS capsule Take 1 capsule (2,000 Units total) by mouth daily. 12/28/14  Yes Georgina Quint Plotnikov, MD  cyanocobalamin (COBAL-1000) 1000 MCG/ML injection Inject 1 mL (1,000 mcg total) into the muscle every 14 (fourteen) days. 03/02/15  Yes Georgina Quint Plotnikov, MD  hydroxychloroquine (PLAQUENIL) 200 MG tablet Take 1 tablet by mouth daily. 04/13/15  Yes Historical Provider, MD  predniSONE (DELTASONE) 5 MG tablet Take 1 tablet by mouth daily. 04/13/15  Yes Historical Provider, MD  Syringe/Needle, Disp, (B-D ECLIPSE SYRINGE) 30G X 1/2" 1 ML MISC 1 each by Does not apply route 1 day or 1 dose. For B12 injections 03/02/15  Yes Tresa Garter, MD    Family History No family history on file.  Social History Social History  Substance Use Topics  . Smoking status: Current Every Day Smoker    Packs/day: 2.00    Years: 3.00    Types: Cigars, Cigarettes    Start date: 04/09/2010    Last attempt to quit: 01/06/2014  . Smokeless tobacco: Never Used     Comment: 02/10/2015 "quit smoking over 1 yr ago'  . Alcohol use No     Allergies   Promethazine   Review of  Systems Review of Systems  Constitutional: Negative for fever.  Respiratory: Positive for shortness of breath. Negative for cough and sputum production.   Cardiovascular: Positive for chest pain. Negative for palpitations.     Physical Exam Updated Vital Signs There were no vitals taken for this visit.  Physical Exam   ED Treatments / Results  Labs (all labs ordered are listed, but only abnormal results are displayed) Labs Reviewed  COMPREHENSIVE METABOLIC PANEL  TROPONIN I  CBC WITH DIFFERENTIAL/PLATELET    EKG  EKG Interpretation  Date/Time:  Friday February 03 2016 10:40:40 EDT Ventricular Rate:  110 PR Interval:    QRS Duration: 83 QT Interval:  253 QTC Calculation: 343 R Axis:   42 Text Interpretation:  Sinus tachycardia RSR' in V1 or V2, probably normal variant Nonspecific T abnormalities, lateral leads No prior ecg for comparison Confirmed by Bennye Nix  MD, Danica Camarena ([id number]) on 02/03/2016 10:51:23 AM Also confirmed by Judd Lien  MD, Gentri Guardado ([id number]), editor Whitney Post, Cala Bradford [phone])  on 02/03/2016 11:16:24 AM       Radiology No results found.  Procedures Procedures (including critical care time)  Medications Ordered in ED Medications - No data to display   Initial Impression / Assessment and Plan / ED Course  I have reviewed the triage vital signs and the nursing notes.  Pertinent labs & imaging results that were available during my care of the patient were reviewed by me and considered in my medical decision making (see chart for details).  Clinical Course    Patient is a 25 year old male with history of lupus. He presents for evaluation of chest discomfort. He describes sharp pains to both sides of his chest that is worse with position, palpation, and deep breathing. His oxygen saturations are 100% on room air. He was initially tachycardic, however this has since resolved. I have considered, but doubt pulmonary embolism. His workup also fails to reveal a cardiac  etiology. His troponin is negative and EKG shows nothing that appears acute. He does have nonspecific T wave abnormalities, however these are similar to ECG from an outside facility from March 2017.  He believes he may have pulled a muscle taking out the garbage at work. His symptoms do seem musculoskeletal in nature and I believe is appropriate for discharge.  Final Clinical Impressions(s) / ED Diagnoses   Final diagnoses:  None    New Prescriptions New Prescriptions   No medications on file     Geoffery Lyons, MD 02/03/16 1239

## 2016-05-25 ENCOUNTER — Emergency Department (HOSPITAL_BASED_OUTPATIENT_CLINIC_OR_DEPARTMENT_OTHER)
Admission: EM | Admit: 2016-05-25 | Discharge: 2016-05-25 | Disposition: A | Discharge status: Home / Self Care | Payer: BLUE CROSS/BLUE SHIELD | Attending: Emergency Medicine | Admitting: Emergency Medicine

## 2016-05-25 ENCOUNTER — Encounter (HOSPITAL_BASED_OUTPATIENT_CLINIC_OR_DEPARTMENT_OTHER): Payer: Self-pay | Admitting: Emergency Medicine

## 2016-05-25 DIAGNOSIS — I10 Essential (primary) hypertension: Secondary | ICD-10-CM | POA: Insufficient documentation

## 2016-05-25 DIAGNOSIS — Z7982 Long term (current) use of aspirin: Secondary | ICD-10-CM | POA: Diagnosis not present

## 2016-05-25 DIAGNOSIS — K6289 Other specified diseases of anus and rectum: Secondary | ICD-10-CM | POA: Diagnosis present

## 2016-05-25 DIAGNOSIS — K644 Residual hemorrhoidal skin tags: Secondary | ICD-10-CM | POA: Diagnosis not present

## 2016-05-25 DIAGNOSIS — F17290 Nicotine dependence, other tobacco product, uncomplicated: Secondary | ICD-10-CM | POA: Diagnosis not present

## 2016-05-25 DIAGNOSIS — Z79899 Other long term (current) drug therapy: Secondary | ICD-10-CM | POA: Insufficient documentation

## 2016-05-25 DIAGNOSIS — F17210 Nicotine dependence, cigarettes, uncomplicated: Secondary | ICD-10-CM | POA: Diagnosis not present

## 2016-05-25 DIAGNOSIS — K649 Unspecified hemorrhoids: Secondary | ICD-10-CM

## 2016-05-25 MED ORDER — DOCUSATE SODIUM 100 MG PO CAPS: 100.0000 mg | ORAL_CAPSULE | Freq: Two times a day (BID) | ORAL | 0 refills | Status: DC

## 2016-05-25 MED ORDER — HYDROCORTISONE ACETATE 25 MG RE SUPP: 25.0000 mg | Freq: Two times a day (BID) | RECTAL | 0 refills | Status: DC

## 2016-05-25 MED ORDER — HYDROCODONE-ACETAMINOPHEN 5-325 MG PO TABS: 1.0000 | ORAL_TABLET | Freq: Four times a day (QID) | ORAL | 0 refills | Status: DC | PRN

## 2016-05-25 MED FILL — ANUCORT-HC 25 MG SUPPOSITOR: 25 | 4 days supply | Qty: 7 | Fill #0

## 2016-05-25 MED FILL — HYDROCODON-APAP 5-325: 5-325 | 4 days supply | Qty: 15 | Fill #0

## 2016-05-25 MED FILL — DOK 100 MG SOFTGEL: 100 | 50 days supply | Qty: 100 | Fill #0

## 2016-05-25 NOTE — ED Provider Notes (Addendum)
[facility] Note   CSN: [id number] Arrival date & time: 05/25/16  1011     History   Chief Complaint Chief Complaint  Patient presents with  . Rectal Pain    HPI Dan Adams is a 26 y.o. male.Point of rectal pain onset 2 AM today after bowel movement feels like hemorrhoid he's had in the past. Pain is worse with sitting not improved by anything he treated himself with a suppository which he does not recall the name of without relief. No other associated symptoms. Denies abdominal pain denies fever.  HPI  Past Medical History:  Diagnosis Date  . Anxiety   . Childhood asthma   . Chronic back pain   . Depression   . Hypertension   . Leukopenia 09/07/2013  . Migraine    "@ least 2-3 times/wk now" (02/10/2015)  . RA (rheumatoid arthritis) (HCC)    "feet, fingers, knees; it's from my lupus" (02/10/2015)  . Stroke-like symptoms 02/09/2015-02/10/2015   "right-left"  . Systemic lupus (HCC)   . Thrombocytopenia, unspecified 09/07/2013    Patient Active Problem List   Diagnosis Date Noted  . Fever, unspecified 04/30/2015  . Constipation 04/29/2015  . GERD (gastroesophageal reflux disease) 04/29/2015  . Generalized anxiety disorder 03/02/2015  . TIA (transient ischemic attack) 02/10/2015  . Stroke-like symptoms 02/10/2015  . Neurologic disorder   . Non compliance with medical treatment 12/28/2014  . Mouth ulcers 12/28/2014  . Hypertension, secondary 10/13/2014  . Vitamin B12 deficiency 08/17/2014  . Vitamin D deficiency 08/17/2014  . Lupus (systemic lupus erythematosus) (HCC) 08/13/2014  . Leukopenia 09/07/2013  . Thrombocytopenia, unspecified 09/07/2013    Past Surgical History:  Procedure Laterality Date  . TONSILLECTOMY AND ADENOIDECTOMY  2007       Home Medications    Prior to Admission medications   Medication Sig Start Date End Date Taking? Authorizing Provider  aspirin EC 81 MG EC tablet Take 1 tablet (81 mg total) by mouth daily.  02/12/15  Yes Leroy Sea, MD  Cholecalciferol (VITAMIN D3) 2000 UNITS capsule Take 1 capsule (2,000 Units total) by mouth daily. 12/28/14  Yes Aleksei Plotnikov V, MD  cyanocobalamin (COBAL-1000) 1000 MCG/ML injection Inject 1 mL (1,000 mcg total) into the muscle every 14 (fourteen) days. 03/02/15  Yes Aleksei Plotnikov V, MD  hydroxychloroquine (PLAQUENIL) 200 MG tablet Take 1 tablet by mouth daily. 04/13/15  Yes Historical Provider, MD  mycophenolate (CELLCEPT) 500 MG tablet Take 1,500 mg by mouth 2 (two) times daily.   Yes Historical Provider, MD  NIFEdipine (PROCARDIA-XL/ADALAT-CC/NIFEDICAL-XL) 30 MG 24 hr tablet Take 30 mg by mouth daily.   Yes Historical Provider, MD  predniSONE (DELTASONE) 5 MG tablet Take 1 tablet by mouth daily. 04/13/15  Yes Historical Provider, MD  Syringe/Needle, Disp, (B-D ECLIPSE SYRINGE) 30G X 1/2" 1 ML MISC 1 each by Does not apply route 1 day or 1 dose. For B12 injections 03/02/15  Yes Aleksei Plotnikov V, MD    Family History No family history on file.  Social History Social History  Substance Use Topics  . Smoking status: Current Every Day Smoker    Packs/day: 2.00    Years: 3.00    Types: Cigars, Cigarettes    Start date: 04/09/2010    Last attempt to quit: 01/06/2014  . Smokeless tobacco: Never Used     Comment: 02/10/2015 "quit smoking over 1 yr ago'  . Alcohol use No     Allergies   Promethazine   Review of Systems  Review of Systems  Constitutional: Negative.   HENT: Negative.   Respiratory: Negative.   Cardiovascular: Negative.   Gastrointestinal: Positive for rectal pain.  Musculoskeletal: Negative.   Skin: Negative.   Neurological: Negative.   Psychiatric/Behavioral: Negative.   All other systems reviewed and are negative.    Physical Exam Updated Vital Signs BP 162/98 (BP Location: Right Arm)   Pulse 78   Temp 98.4 F (36.9 C) (Oral)   Resp 18   Ht 5\' 4"  (1.626 m)   Wt 170 lb (77.1 kg)   SpO2 100%   BMI 29.18 kg/m    Physical Exam  Constitutional: He appears well-developed and well-nourished. No distress.  HENT:  Head: Normocephalic and atraumatic.  Eyes: EOM are normal.  Neck: Normal range of motion.  Cardiovascular: Normal rate.   Pulmonary/Chest: Effort normal.  Abdominal: Soft. Bowel sounds are normal. He exhibits no distension. There is no tenderness.  Genitourinary: Penis normal.  Genitourinary Comments: Rectal exam External hemorrhoid, tender. Genital rectal exam not performed.     ED Treatments / Results  Labs (all labs ordered are listed, but only abnormal results are displayed) Labs Reviewed - No data to display  EKG  EKG Interpretation None       Radiology No results found.  Procedures Procedures (including critical care time)  Medications Ordered in ED Medications - No data to display   Initial Impression / Assessment and Plan / ED Course  I have reviewed the triage vital signs and the nursing notes.  Pertinent labs & imaging results that were available during my care of the patient were reviewed by me and considered in my medical decision making (see chart for details).     Plan sitz baths, prescriptions Colace, Norco.Anusol HC suppositories Referral [facility] Controlled Substance reporting System queried Patient reports she has not taken his blood pressure medicine yet today. He is advised take his blood pressure medication as soon as he gets home. Blood pressure recheck one week Final Clinical Impressions(s) / ED Diagnoses  Diagnosis#1external hemorrhoid #2 elevated blood pressure Final diagnoses:  None    New Prescriptions New Prescriptions   No medications on file     [facility], MD 05/25/16 1240    05/27/16, MD 05/25/16 1311

## 2016-05-25 NOTE — Discharge Instructions (Signed)
Sit in warm bathtub 4 times daily for 30 minutes at a time. Take the stool softener(Colace) as directed. Take the pain medicine prescribed for bad pain or Tylenol for mild pain. Don't take the pain medicine prescribed together with Tylenol as the combination can be dangerous to your liver. Use the pain medicine prescribed sparingly as it can cause constipation. If you're not improving in 3 days, call [facility] to schedule the next available appointment

## 2016-05-25 NOTE — ED Triage Notes (Signed)
Pt states he is having rectal pain since 2 am.  Pt tried suppository for hemorrhoids, but hasnt help much.  No bleeding.  No known fever.

## 2016-05-28 ENCOUNTER — Encounter (HOSPITAL_BASED_OUTPATIENT_CLINIC_OR_DEPARTMENT_OTHER): Payer: Self-pay | Admitting: *Deleted

## 2016-05-28 ENCOUNTER — Emergency Department (HOSPITAL_BASED_OUTPATIENT_CLINIC_OR_DEPARTMENT_OTHER): Payer: BLUE CROSS/BLUE SHIELD

## 2016-05-28 ENCOUNTER — Emergency Department (HOSPITAL_BASED_OUTPATIENT_CLINIC_OR_DEPARTMENT_OTHER)
Admission: EM | Admit: 2016-05-28 | Discharge: 2016-05-28 | Payer: BLUE CROSS/BLUE SHIELD | Attending: Emergency Medicine | Admitting: Emergency Medicine

## 2016-05-28 DIAGNOSIS — W1839XA Other fall on same level, initial encounter: Secondary | ICD-10-CM | POA: Diagnosis not present

## 2016-05-28 DIAGNOSIS — F17210 Nicotine dependence, cigarettes, uncomplicated: Secondary | ICD-10-CM | POA: Insufficient documentation

## 2016-05-28 DIAGNOSIS — Y998 Other external cause status: Secondary | ICD-10-CM | POA: Insufficient documentation

## 2016-05-28 DIAGNOSIS — Y929 Unspecified place or not applicable: Secondary | ICD-10-CM | POA: Insufficient documentation

## 2016-05-28 DIAGNOSIS — Z5181 Encounter for therapeutic drug level monitoring: Secondary | ICD-10-CM | POA: Diagnosis not present

## 2016-05-28 DIAGNOSIS — S8992XA Unspecified injury of left lower leg, initial encounter: Secondary | ICD-10-CM | POA: Diagnosis present

## 2016-05-28 DIAGNOSIS — S86812A Strain of other muscle(s) and tendon(s) at lower leg level, left leg, initial encounter: Secondary | ICD-10-CM | POA: Diagnosis not present

## 2016-05-28 DIAGNOSIS — F17290 Nicotine dependence, other tobacco product, uncomplicated: Secondary | ICD-10-CM | POA: Insufficient documentation

## 2016-05-28 DIAGNOSIS — S86811A Strain of other muscle(s) and tendon(s) at lower leg level, right leg, initial encounter: Secondary | ICD-10-CM | POA: Diagnosis not present

## 2016-05-28 DIAGNOSIS — Z79899 Other long term (current) drug therapy: Secondary | ICD-10-CM | POA: Insufficient documentation

## 2016-05-28 DIAGNOSIS — Y9367 Activity, basketball: Secondary | ICD-10-CM | POA: Insufficient documentation

## 2016-05-28 DIAGNOSIS — I10 Essential (primary) hypertension: Secondary | ICD-10-CM | POA: Diagnosis not present

## 2016-05-28 DIAGNOSIS — Z7982 Long term (current) use of aspirin: Secondary | ICD-10-CM | POA: Insufficient documentation

## 2016-05-28 LAB — CBC WITH DIFFERENTIAL/PLATELET
BASOS ABS: 0 10*3/uL (ref 0.0–0.1)
BASOS PCT: 0 %
Eosinophils Absolute: 0 10*3/uL (ref 0.0–0.7)
Eosinophils Relative: 0 %
HEMATOCRIT: 38.1 % — AB (ref 39.0–52.0)
HEMOGLOBIN: 13.1 g/dL (ref 13.0–17.0)
Lymphocytes Relative: 5 %
Lymphs Abs: 0.4 10*3/uL — ABNORMAL LOW (ref 0.7–4.0)
MCH: 29.4 pg (ref 26.0–34.0)
MCHC: 34.4 g/dL (ref 30.0–36.0)
MCV: 85.6 fL (ref 78.0–100.0)
Monocytes Absolute: 0.5 10*3/uL (ref 0.1–1.0)
Monocytes Relative: 7 %
NEUTROS ABS: 6.7 10*3/uL (ref 1.7–7.7)
NEUTROS PCT: 88 %
Platelets: 190 10*3/uL (ref 150–400)
RBC: 4.45 MIL/uL (ref 4.22–5.81)
RDW: 12.1 % (ref 11.5–15.5)
WBC: 7.5 10*3/uL (ref 4.0–10.5)

## 2016-05-28 LAB — PROTIME-INR
INR: 1.07
Prothrombin Time: 13.9 seconds (ref 11.4–15.2)

## 2016-05-28 LAB — BASIC METABOLIC PANEL
ANION GAP: 7 (ref 5–15)
BUN: 21 mg/dL — ABNORMAL HIGH (ref 6–20)
CALCIUM: 8.6 mg/dL — AB (ref 8.9–10.3)
CO2: 24 mmol/L (ref 22–32)
Chloride: 105 mmol/L (ref 101–111)
Creatinine, Ser: 1 mg/dL (ref 0.61–1.24)
GFR calc non Af Amer: >60 mL/min (ref >60)
Glucose, Bld: 91 mg/dL (ref 65–99)
Potassium: 3.1 mmol/L — ABNORMAL LOW (ref 3.5–5.1)
Sodium: 136 mmol/L (ref 135–145)

## 2016-05-28 LAB — APTT: APTT: 27 s (ref 24–36)

## 2016-05-28 MED ORDER — SODIUM CHLORIDE 0.9 % IV BOLUS (SEPSIS)
500.0000 mL | Freq: Once | INTRAVENOUS | Status: AC
  Administered 2016-05-28: 500 mL via INTRAVENOUS

## 2016-05-28 NOTE — ED Provider Notes (Signed)
[facility]   CSN: [id number] Arrival date & time: 05/28/16  1024     History   Chief Complaint Chief Complaint  Patient presents with  . Leg Pain    HPI Dan Adams is a 26 y.o. male.  Patient is a 26 yo male with a history of hypertension and lupus who presents with bilateral knee pain. He was playing basketball yesterday and came down on his legs. He felt like they buckled and then he fell forward on both of his knees. He denies any other injuries. He's been complaining of pain and swelling in both of his knees since that time. He states it hurts to ambulate. He denies hitting his head. No neck or back pain.      Past Medical History:  Diagnosis Date  . Anxiety   . Childhood asthma   . Chronic back pain   . Depression   . Hypertension   . Leukopenia 09/07/2013  . Migraine    "@ least 2-3 times/wk now" (02/10/2015)  . RA (rheumatoid arthritis) (HCC)    "feet, fingers, knees; it's from my lupus" (02/10/2015)  . Stroke-like symptoms 02/09/2015-02/10/2015   "right-left"  . Systemic lupus (HCC)   . Thrombocytopenia, unspecified 09/07/2013    Patient Active Problem List   Diagnosis Date Noted  . Fever, unspecified 04/30/2015  . Constipation 04/29/2015  . GERD (gastroesophageal reflux disease) 04/29/2015  . Generalized anxiety disorder 03/02/2015  . TIA (transient ischemic attack) 02/10/2015  . Stroke-like symptoms 02/10/2015  . Neurologic disorder   . Non compliance with medical treatment 12/28/2014  . Mouth ulcers 12/28/2014  . Hypertension, secondary 10/13/2014  . Vitamin B12 deficiency 08/17/2014  . Vitamin D deficiency 08/17/2014  . Lupus (systemic lupus erythematosus) (HCC) 08/13/2014  . Leukopenia 09/07/2013  . Thrombocytopenia, unspecified 09/07/2013    Past Surgical History:  Procedure Laterality Date  . TONSILLECTOMY AND ADENOIDECTOMY  2007       Home Medications    Prior to Admission medications   Medication Sig  Start Date End Date Taking? Authorizing Provider  aspirin EC 81 MG EC tablet Take 1 tablet (81 mg total) by mouth daily. 02/12/15  Yes Leroy Sea, MD  docusate sodium (COLACE) 100 MG capsule Take 1 capsule (100 mg total) by mouth every 12 (twelve) hours. 05/25/16  Yes Doug Sou, MD  hydrocortisone (ANUSOL-HC) 25 MG suppository Place 1 suppository (25 mg total) rectally 2 (two) times daily. For 7 days 05/25/16  Yes Doug Sou, MD  hydroxychloroquine (PLAQUENIL) 200 MG tablet Take 1 tablet by mouth daily. 04/13/15  Yes Historical Provider, MD  mycophenolate (CELLCEPT) 500 MG tablet Take 1,500 mg by mouth 2 (two) times daily.   Yes Historical Provider, MD  NIFEdipine (PROCARDIA-XL/ADALAT-CC/NIFEDICAL-XL) 30 MG 24 hr tablet Take 30 mg by mouth daily.   Yes Historical Provider, MD  predniSONE (DELTASONE) 5 MG tablet Take 1 tablet by mouth daily. 04/13/15  Yes Historical Provider, MD  Cholecalciferol (VITAMIN D3) 2000 UNITS capsule Take 1 capsule (2,000 Units total) by mouth daily. 12/28/14   Aleksei Plotnikov V, MD  cyanocobalamin (COBAL-1000) 1000 MCG/ML injection Inject 1 mL (1,000 mcg total) into the muscle every 14 (fourteen) days. 03/02/15   Aleksei Plotnikov V, MD  HYDROcodone-acetaminophen (NORCO) 5-325 MG tablet Take 1 tablet by mouth every 6 (six) hours as needed. 05/25/16   Doug Sou, MD  Syringe/Needle, Disp, (B-D ECLIPSE SYRINGE) 30G X 1/2" 1 ML MISC 1 each by Does not apply route 1 day  or 1 dose. For B12 injections 03/02/15   Aleksei Plotnikov V, MD    Family History No family history on file.  Social History Social History  Substance Use Topics  . Smoking status: Current Every Day Smoker    Packs/day: 2.00    Years: 3.00    Types: Cigars, Cigarettes    Start date: 04/09/2010    Last attempt to quit: 01/06/2014  . Smokeless tobacco: Never Used     Comment: 02/10/2015 "quit smoking over 1 yr ago'  . Alcohol use No     Allergies   Promethazine   Review of  Systems Review of Systems  Constitutional: Negative for fever.  Gastrointestinal: Negative for nausea and vomiting.  Musculoskeletal: Positive for arthralgias and joint swelling. Negative for back pain and neck pain.  Skin: Negative for wound.  Neurological: Negative for weakness, numbness and headaches.     Physical Exam Updated Vital Signs BP 154/98 (BP Location: Right Arm)   Pulse 96   Temp 98.7 F (37.1 C) (Oral)   Resp 18   Ht 5\' 4"  (1.626 m)   Wt 170 lb (77.1 kg)   SpO2 100%   BMI 29.18 kg/m   Physical Exam  Constitutional: He is oriented to person, place, and time. He appears well-developed and well-nourished.  HENT:  Head: Normocephalic and atraumatic.  Neck: Normal range of motion. Neck supple.  Cardiovascular: Normal rate.   Pulmonary/Chest: Effort normal.  Musculoskeletal: He exhibits edema and tenderness.  Bilateral knee effusions with generalized tenderness to both knees with some overlying ecchymosis to the anterior portion of both knees. No pain to the hips or ankles. Pedal pulses are intact. He has normal sensation and motor function distally. He is unable to do straight leg raises bilaterally  Neurological: He is alert and oriented to person, place, and time.  Skin: Skin is warm and dry.  Psychiatric: He has a normal mood and affect.     ED Treatments / Results  Labs (all labs ordered are listed, but only abnormal results are displayed) Labs Reviewed  CBC WITH DIFFERENTIAL/PLATELET - Abnormal; Notable for the following:       Result Value   HCT 38.1 (*)    Lymphs Abs 0.4 (*)    All other components within normal limits  BASIC METABOLIC PANEL  PROTIME-INR  APTT  TYPE AND SCREEN    EKG  EKG Interpretation None       Radiology Dg Knee Complete 4 Views Left  Result Date: 05/28/2016 CLINICAL DATA:  05/30/2016 directly on left knee last evening. Severe pain and swelling. EXAM: LEFT KNEE - COMPLETE 4+ VIEW COMPARISON:  None. FINDINGS: The joint  spaces are maintained. No acute fracture or osteochondral abnormality. Patella alta is noted and there is marked abnormal soft tissue swelling/ edema/ fluid around the anterior aspect of the knee. The patellar tendon is not well demonstrated and is likely ruptured. IMPRESSION: No acute bony findings. Findings highly suspicious for a ruptured patellar tendon. Electronically Signed   By: Larey Seat M.D.   On: 05/28/2016 12:21   Dg Knee Complete 4 Views Right  Result Date: 05/28/2016 CLINICAL DATA:  05/30/2016 last night directly onto both knees. EXAM: RIGHT KNEE - COMPLETE 4+ VIEW COMPARISON:  None. FINDINGS: The joint spaces are maintained. No acute fracture is identified. Patella Larey Seat is noted and there is marked diffuse soft tissue swelling/ edema along the anterior aspect of the knee. The patellar tendon is not well demonstrated and is likely ruptured.  No obvious joint effusion. IMPRESSION: No acute fracture but findings highly suspicious for ruptured patellar tendon. Recommend clinical correlation and if indicated MRI may be helpful. Electronically Signed   By: Rudie Meyer M.D.   On: 05/28/2016 12:19    Procedures Procedures (including critical care time)  Medications Ordered in ED Medications  sodium chloride 0.9 % bolus 500 mL (500 mLs Intravenous New Bag/Given 05/28/16 1351)     Initial Impression / Assessment and Plan / ED Course  I have reviewed the triage vital signs and the nursing notes.  Pertinent labs & imaging results that were available during my care of the patient were reviewed by me and considered in my medical decision making (see chart for details).     Exam and x-rays are consistent with bilateral patellar tendon ruptures. I initially spoke to Dr. Ophelia Charter with orthopedic surgery at [facility] who request transfer of the patient to the ED at [facility]. However then the mother decided that the patient should be transferred to [facility] as patient's care team with his lupus is at  [facility]. I spoke with the transfer nurse at [facility] who advises that Dr. Constance Goltz with ortho/trauma at [facility] has accepted the pt for transfer to [facility] ED.  Pt notified.  [facility] to notify Dr. Ophelia Charter that pt is not coming to [facility].  Pre-op labs ordered.  PT placed in bilateral knee immobilizers for transfer.  Patient is comfortable appearing and talking on his cell phone. He has not requested any medications for pain. He is advised to remain nothing by mouth.  Final Clinical Impressions(s) / ED Diagnoses   Final diagnoses:  Patellar tendon rupture, left, initial encounter  Patellar tendon rupture, right, initial encounter    New Prescriptions New Prescriptions   No medications on file     Rolan Bucco, MD 05/28/16 1404

## 2016-05-28 NOTE — ED Triage Notes (Signed)
Pt playing basketball yesterday and landed awkwardly after jumping. C/o pain in both knees

## 2016-11-21 ENCOUNTER — Encounter (HOSPITAL_BASED_OUTPATIENT_CLINIC_OR_DEPARTMENT_OTHER): Payer: Self-pay | Admitting: *Deleted

## 2016-11-21 ENCOUNTER — Emergency Department (HOSPITAL_BASED_OUTPATIENT_CLINIC_OR_DEPARTMENT_OTHER): Payer: BLUE CROSS/BLUE SHIELD

## 2016-11-21 ENCOUNTER — Emergency Department (HOSPITAL_BASED_OUTPATIENT_CLINIC_OR_DEPARTMENT_OTHER)
Admission: EM | Admit: 2016-11-21 | Discharge: 2016-11-21 | Disposition: A | Discharge status: Home / Self Care | Payer: BLUE CROSS/BLUE SHIELD | Attending: Emergency Medicine | Admitting: Emergency Medicine

## 2016-11-21 DIAGNOSIS — I313 Pericardial effusion (noninflammatory): Secondary | ICD-10-CM | POA: Insufficient documentation

## 2016-11-21 DIAGNOSIS — F17210 Nicotine dependence, cigarettes, uncomplicated: Secondary | ICD-10-CM | POA: Insufficient documentation

## 2016-11-21 DIAGNOSIS — I10 Essential (primary) hypertension: Secondary | ICD-10-CM | POA: Insufficient documentation

## 2016-11-21 DIAGNOSIS — I3139 Other pericardial effusion (noninflammatory): Secondary | ICD-10-CM

## 2016-11-21 DIAGNOSIS — J45909 Unspecified asthma, uncomplicated: Secondary | ICD-10-CM | POA: Insufficient documentation

## 2016-11-21 DIAGNOSIS — Z79899 Other long term (current) drug therapy: Secondary | ICD-10-CM | POA: Insufficient documentation

## 2016-11-21 DIAGNOSIS — Z7982 Long term (current) use of aspirin: Secondary | ICD-10-CM | POA: Diagnosis not present

## 2016-11-21 DIAGNOSIS — J209 Acute bronchitis, unspecified: Secondary | ICD-10-CM | POA: Insufficient documentation

## 2016-11-21 DIAGNOSIS — R05 Cough: Secondary | ICD-10-CM | POA: Diagnosis present

## 2016-11-21 DIAGNOSIS — J40 Bronchitis, not specified as acute or chronic: Secondary | ICD-10-CM

## 2016-11-21 LAB — BASIC METABOLIC PANEL
ANION GAP: 12 (ref 5–15)
BUN: 19 mg/dL (ref 6–20)
CALCIUM: 8.9 mg/dL (ref 8.9–10.3)
CO2: 24 mmol/L (ref 22–32)
Chloride: 97 mmol/L — ABNORMAL LOW (ref 101–111)
Creatinine, Ser: 1.06 mg/dL (ref 0.61–1.24)
GFR calc Af Amer: >60 mL/min (ref >60)
GLUCOSE: 117 mg/dL — AB (ref 65–99)
POTASSIUM: 3.5 mmol/L (ref 3.5–5.1)
Sodium: 133 mmol/L — ABNORMAL LOW (ref 135–145)

## 2016-11-21 LAB — CBC
HCT: 34.2 % — ABNORMAL LOW (ref 39.0–52.0)
Hemoglobin: 11.5 g/dL — ABNORMAL LOW (ref 13.0–17.0)
MCH: 29 pg (ref 26.0–34.0)
MCHC: 33.6 g/dL (ref 30.0–36.0)
MCV: 86.1 fL (ref 78.0–100.0)
PLATELETS: 395 10*3/uL (ref 150–400)
RBC: 3.97 MIL/uL — ABNORMAL LOW (ref 4.22–5.81)
RDW: 12.4 % (ref 11.5–15.5)
WBC: 10.7 10*3/uL — ABNORMAL HIGH (ref 4.0–10.5)

## 2016-11-21 MED ORDER — SODIUM CHLORIDE 0.9 % IV BOLUS (SEPSIS)
1000.0000 mL | Freq: Once | INTRAVENOUS | Status: AC
  Administered 2016-11-21: 1000 mL via INTRAVENOUS

## 2016-11-21 MED ORDER — ALBUTEROL SULFATE HFA 108 (90 BASE) MCG/ACT IN AERS
2.0000 | INHALATION_SPRAY | Freq: Once | RESPIRATORY_TRACT | Status: AC
  Administered 2016-11-21: 2 via RESPIRATORY_TRACT
  Filled 2016-11-21: qty 6.7

## 2016-11-21 MED ORDER — IOPAMIDOL (ISOVUE-370) INJECTION 76%
100.0000 mL | Freq: Once | INTRAVENOUS | Status: AC | PRN
  Administered 2016-11-21: 100 mL via INTRAVENOUS

## 2016-11-21 NOTE — ED Notes (Signed)
Pt has disc from CT to take to his f/u appt at [facility] on Friday

## 2016-11-21 NOTE — ED Notes (Signed)
D/c papers taken in to pt. Pt requesting to see MD again before d/c because his mother has some questions. Dr. Patria Mane made aware and at bedside

## 2016-11-21 NOTE — ED Provider Notes (Signed)
[facility] Provider Note   CSN: [id number] Arrival date & time: 11/21/16  8250     History   Chief Complaint Chief Complaint  Patient presents with  . Back Pain  . Cough    HPI Dan Adams is a 26 y.o. male.  HPI Patient is a 26 year old male who presents emergency department with cough and back pain.  Reports his cough is productive.  He has chills without documented fever.  He has a history of lupus.  He also has a history of pneumonia.  He denies unilateral leg swelling.  No history DVT or pulmonary embolism.  Denies orthopnea.  Denies exertional shortness of breath.  No significant shortness of breath.  He reports a history of childhood asthma but does not regularly take bronchodilators.  His rheumatologist is at [facility].  He denies nausea vomiting and diarrhea.  No other complaints at this time.   Past Medical History:  Diagnosis Date  . Anxiety   . Childhood asthma   . Chronic back pain   . Depression   . Hypertension   . Leukopenia 09/07/2013  . Migraine    "@ least 2-3 times/wk now" (02/10/2015)  . RA (rheumatoid arthritis) (HCC)    "feet, fingers, knees; it's from my lupus" (02/10/2015)  . Stroke-like symptoms 02/09/2015-02/10/2015   "right-left"  . Systemic lupus (HCC)   . Thrombocytopenia, unspecified (HCC) 09/07/2013    Patient Active Problem List   Diagnosis Date Noted  . Fever, unspecified 04/30/2015  . Constipation 04/29/2015  . GERD (gastroesophageal reflux disease) 04/29/2015  . Generalized anxiety disorder 03/02/2015  . TIA (transient ischemic attack) 02/10/2015  . Stroke-like symptoms 02/10/2015  . Neurologic disorder   . Non compliance with medical treatment 12/28/2014  . Mouth ulcers 12/28/2014  . Hypertension, secondary 10/13/2014  . Vitamin B12 deficiency 08/17/2014  . Vitamin D deficiency 08/17/2014  . Lupus (systemic lupus erythematosus) (HCC) 08/13/2014  . Leukopenia 09/07/2013  . Thrombocytopenia, unspecified (HCC)  09/07/2013    Past Surgical History:  Procedure Laterality Date  . TONSILLECTOMY AND ADENOIDECTOMY  2007       Home Medications    Prior to Admission medications   Medication Sig Start Date End Date Taking? Authorizing Provider  aspirin EC 81 MG EC tablet Take 1 tablet (81 mg total) by mouth daily. 02/12/15   Leroy Sea, MD  Cholecalciferol (VITAMIN D3) 2000 UNITS capsule Take 1 capsule (2,000 Units total) by mouth daily. 12/28/14   Plotnikov, Georgina Quint, MD  cyanocobalamin (COBAL-1000) 1000 MCG/ML injection Inject 1 mL (1,000 mcg total) into the muscle every 14 (fourteen) days. 03/02/15   Plotnikov, Georgina Quint, MD  docusate sodium (COLACE) 100 MG capsule Take 1 capsule (100 mg total) by mouth every 12 (twelve) hours. 05/25/16   Doug Sou, MD  hydrocortisone (ANUSOL-HC) 25 MG suppository Place 1 suppository (25 mg total) rectally 2 (two) times daily. For 7 days 05/25/16   Doug Sou, MD  hydroxychloroquine (PLAQUENIL) 200 MG tablet Take 1 tablet by mouth daily. 04/13/15   [provider]  mycophenolate (CELLCEPT) 500 MG tablet Take 1,500 mg by mouth 2 (two) times daily.    [provider]  NIFEdipine (PROCARDIA-XL/ADALAT-CC/NIFEDICAL-XL) 30 MG 24 hr tablet Take 30 mg by mouth daily.    [provider]  predniSONE (DELTASONE) 5 MG tablet Take 1 tablet by mouth daily. 04/13/15   [provider]  Syringe/Needle, Disp, (B-D ECLIPSE SYRINGE) 30G X 1/2" 1 ML MISC 1 each by Does not  apply route 1 day or 1 dose. For B12 injections 03/02/15   Plotnikov, Georgina Quint, MD    Family History No family history on file.  Social History Social History  Substance Use Topics  . Smoking status: Current Every Day Smoker    Packs/day: 2.00    Years: 3.00    Types: Cigars, Cigarettes    Start date: 04/09/2010    Last attempt to quit: 01/06/2014  . Smokeless tobacco: Never Used     Comment: 02/10/2015 "quit smoking over 1 yr ago'  . Alcohol use No      Allergies   Promethazine   Review of Systems Review of Systems  All other systems reviewed and are negative.    Physical Exam Updated Vital Signs BP 137/83 (BP Location: Right Arm)   Pulse (!) 122   Temp (!) 100.6 F (38.1 C) (Oral)   Resp 20   Ht 5\' 4"  (1.626 m)   Wt 78.9 kg (174 lb)   SpO2 99%   BMI 29.87 kg/m   Physical Exam  Constitutional: He is oriented to person, place, and time. He appears well-developed and well-nourished.  HENT:  Head: Normocephalic and atraumatic.  Eyes: EOM are normal.  Neck: Normal range of motion.  Cardiovascular: Normal rate, regular rhythm, normal heart sounds and intact distal pulses.   Pulmonary/Chest: Effort normal and breath sounds normal. No respiratory distress.  Abdominal: Soft. He exhibits no distension. There is no tenderness.  Musculoskeletal: Normal range of motion. He exhibits no edema.  Neurological: He is alert and oriented to person, place, and time.  Skin: Skin is warm and dry.  Psychiatric: He has a normal mood and affect. Judgment normal.  Nursing note and vitals reviewed.    ED Treatments / Results  Labs (all labs ordered are listed, but only abnormal results are displayed) Labs Reviewed  CBC - Abnormal; Notable for the following:       Result Value   WBC 10.7 (*)    RBC 3.97 (*)    Hemoglobin 11.5 (*)    HCT 34.2 (*)    All other components within normal limits  BASIC METABOLIC PANEL - Abnormal; Notable for the following:    Sodium 133 (*)    Chloride 97 (*)    Glucose, Bld 117 (*)    All other components within normal limits    EKG  EKG Interpretation None       Radiology Dg Chest 2 View  Result Date: 11/21/2016 CLINICAL DATA:  Cough, chest pain and shortness of breath for 3 days. History of childhood asthma and systemic lupus erythematosus. EXAM: CHEST  2 VIEW COMPARISON:  Radiographs 02/03/2016.  CT 05/11/2015. FINDINGS: There are lower lung volumes. The heart size has increased. This  may in part be secondary to the lower lung volumes, although progressive cardiomegaly or development of a pericardial effusion cannot be excluded. There is a small left pleural effusion with mild linear atelectasis at both lung bases. No edema, pneumothorax or osseous abnormality is seen. IMPRESSION: 1. Interval enlargement of the cardiac silhouette may in part be secondary to lower lung volumes, although progressive cardiomegaly or pericardial effusion cannot be excluded. 2. Small left pleural effusion and mild bibasilar atelectasis. Electronically Signed   By: 07/09/2015 M.D.   On: 11/21/2016 10:24   Ct Angio Chest Pe W And/or Wo Contrast  Result Date: 11/21/2016 CLINICAL DATA:  Shortness of breath. EXAM: CT ANGIOGRAPHY CHEST WITH CONTRAST TECHNIQUE: Multidetector CT imaging of the chest  was performed using the standard protocol during bolus administration of intravenous contrast. Multiplanar CT image reconstructions and MIPs were obtained to evaluate the vascular anatomy. CONTRAST:  100 cc Isovue 370 intravenous contrast. COMPARISON:  CT chest dated May 11, 2015. FINDINGS: Cardiovascular: Satisfactory opacification of the pulmonary arteries to the segmental level. No evidence of pulmonary embolism. Normal heart size. Moderate pericardial effusion. Normal thoracic aorta. Mediastinum/Nodes: Bilateral axillary lymphadenopathy, measuring up to 10 mm, similar to prior study. No enlarged mediastinal or hilar lymph nodes. The thyroid gland, trachea, and esophagus are unremarkable. Lungs/Pleura: Lingular atelectasis. Trace left pleural effusion. Left greater than right bibasilar atelectasis. No suspicious pulmonary nodule. No pneumothorax. Upper Abdomen: No acute abnormality. Musculoskeletal: No chest wall abnormality. No acute or significant osseous findings. Review of the MIP images confirms the above findings. IMPRESSION: 1. No evidence of pulmonary embolism. 2. New moderate pericardial effusion, which  can be seen with lupus. 3. Unchanged bilateral axillary lymphadenopathy, nonspecific, and also possibly related to patient's lupus. 4. Trace left pleural effusion. Electronically Signed   By: Obie Dredge M.D.   On: 11/21/2016 11:43    Procedures Procedures (including critical care time)  Medications Ordered in ED Medications  albuterol (PROVENTIL HFA;VENTOLIN HFA) 108 (90 Base) MCG/ACT inhaler 2 puff (not administered)  sodium chloride 0.9 % bolus 1,000 mL (1,000 mLs Intravenous New Bag/Given 11/21/16 1029)  iopamidol (ISOVUE-370) 76 % injection 100 mL (100 mLs Intravenous Contrast Given 11/21/16 1113)     Initial Impression / Assessment and Plan / ED Course  I have reviewed the triage vital signs and the nursing notes.  Pertinent labs & imaging results that were available during my care of the patient were reviewed by me and considered in my medical decision making (see chart for details).     Patient is overall well-appearing.  CT scan demonstrates no evidence of pneumonia.  He does have a moderate sized pericardial effusion.  He does not seem to be rather symptomatic from this.  I have sent him home with a CD copy of both his x-ray and his CT scan of his chest for which she is to follow-up with his rheumatologist.  He is scheduled to see his rheumatologist in less than 48 hours.  I told him he may benefit from an echocardiogram to further evaluate this pericardial effusion but I likely think it is not related to his symptoms at this time.  His symptoms are more consistent with bronchitis.  No focal pneumonia noted on CT scan.  Patient be discharged home with bronchodilator see if this helps with this cough.  He understands to return to the ER for new or worsening symptoms  Final Clinical Impressions(s) / ED Diagnoses   Final diagnoses:  Bronchitis  Pericardial effusion    New Prescriptions New Prescriptions   No medications on file     Azalia Bilis, MD 11/21/16 1206

## 2016-11-21 NOTE — ED Triage Notes (Signed)
Pt reports one week of chest and back "discomfort", with relief with "muscle cream". Pt states he has had an intermittent productive cough for over one week, denies fevers or any other c/o, pt wonders if this is the beginning of a flare up of his lupus.

## 2016-12-06 ENCOUNTER — Emergency Department (HOSPITAL_BASED_OUTPATIENT_CLINIC_OR_DEPARTMENT_OTHER): Payer: BLUE CROSS/BLUE SHIELD

## 2016-12-06 ENCOUNTER — Encounter (HOSPITAL_BASED_OUTPATIENT_CLINIC_OR_DEPARTMENT_OTHER): Payer: Self-pay

## 2016-12-06 ENCOUNTER — Inpatient Hospital Stay (HOSPITAL_BASED_OUTPATIENT_CLINIC_OR_DEPARTMENT_OTHER)
Admission: EM | Admit: 2016-12-06 | Discharge: 2016-12-09 | DRG: 546 | Disposition: A | Discharge status: Home / Self Care | Payer: BLUE CROSS/BLUE SHIELD | Attending: Internal Medicine | Admitting: Internal Medicine

## 2016-12-06 DIAGNOSIS — J90 Pleural effusion, not elsewhere classified: Secondary | ICD-10-CM | POA: Diagnosis present

## 2016-12-06 DIAGNOSIS — D649 Anemia, unspecified: Secondary | ICD-10-CM

## 2016-12-06 DIAGNOSIS — I429 Cardiomyopathy, unspecified: Secondary | ICD-10-CM | POA: Diagnosis present

## 2016-12-06 DIAGNOSIS — Z6832 Body mass index (BMI) 32.0-32.9, adult: Secondary | ICD-10-CM

## 2016-12-06 DIAGNOSIS — Z9889 Other specified postprocedural states: Secondary | ICD-10-CM

## 2016-12-06 DIAGNOSIS — Z7982 Long term (current) use of aspirin: Secondary | ICD-10-CM

## 2016-12-06 DIAGNOSIS — I3139 Other pericardial effusion (noninflammatory): Secondary | ICD-10-CM

## 2016-12-06 DIAGNOSIS — R079 Chest pain, unspecified: Secondary | ICD-10-CM | POA: Diagnosis present

## 2016-12-06 DIAGNOSIS — I10 Essential (primary) hypertension: Secondary | ICD-10-CM | POA: Diagnosis present

## 2016-12-06 DIAGNOSIS — R000 Tachycardia, unspecified: Secondary | ICD-10-CM | POA: Diagnosis present

## 2016-12-06 DIAGNOSIS — M329 Systemic lupus erythematosus, unspecified: Principal | ICD-10-CM | POA: Diagnosis present

## 2016-12-06 DIAGNOSIS — I159 Secondary hypertension, unspecified: Secondary | ICD-10-CM | POA: Diagnosis present

## 2016-12-06 DIAGNOSIS — I313 Pericardial effusion (noninflammatory): Secondary | ICD-10-CM | POA: Diagnosis present

## 2016-12-06 DIAGNOSIS — F17290 Nicotine dependence, other tobacco product, uncomplicated: Secondary | ICD-10-CM | POA: Diagnosis present

## 2016-12-06 DIAGNOSIS — D638 Anemia in other chronic diseases classified elsewhere: Secondary | ICD-10-CM | POA: Diagnosis present

## 2016-12-06 DIAGNOSIS — F17210 Nicotine dependence, cigarettes, uncomplicated: Secondary | ICD-10-CM | POA: Diagnosis present

## 2016-12-06 DIAGNOSIS — M069 Rheumatoid arthritis, unspecified: Secondary | ICD-10-CM | POA: Diagnosis present

## 2016-12-06 DIAGNOSIS — E669 Obesity, unspecified: Secondary | ICD-10-CM | POA: Diagnosis present

## 2016-12-06 DIAGNOSIS — Z79899 Other long term (current) drug therapy: Secondary | ICD-10-CM

## 2016-12-06 DIAGNOSIS — R071 Chest pain on breathing: Secondary | ICD-10-CM | POA: Diagnosis not present

## 2016-12-06 DIAGNOSIS — Z7952 Long term (current) use of systemic steroids: Secondary | ICD-10-CM

## 2016-12-06 DIAGNOSIS — F411 Generalized anxiety disorder: Secondary | ICD-10-CM | POA: Diagnosis present

## 2016-12-06 DIAGNOSIS — E876 Hypokalemia: Secondary | ICD-10-CM | POA: Diagnosis present

## 2016-12-06 DIAGNOSIS — Z888 Allergy status to other drugs, medicaments and biological substances status: Secondary | ICD-10-CM

## 2016-12-06 LAB — BASIC METABOLIC PANEL
Anion gap: 11 (ref 5–15)
BUN: 18 mg/dL (ref 6–20)
CO2: 24 mmol/L (ref 22–32)
Calcium: 9.1 mg/dL (ref 8.9–10.3)
Chloride: 98 mmol/L — ABNORMAL LOW (ref 101–111)
Creatinine, Ser: 1.11 mg/dL (ref 0.61–1.24)
GFR calc Af Amer: >60 mL/min (ref >60)
GLUCOSE: 119 mg/dL — AB (ref 65–99)
POTASSIUM: 3.7 mmol/L (ref 3.5–5.1)
Sodium: 133 mmol/L — ABNORMAL LOW (ref 135–145)

## 2016-12-06 LAB — BRAIN NATRIURETIC PEPTIDE: B Natriuretic Peptide: 172 pg/mL — ABNORMAL HIGH (ref 0.0–100.0)

## 2016-12-06 LAB — CBC
HEMATOCRIT: 33.8 % — AB (ref 39.0–52.0)
Hemoglobin: 11 g/dL — ABNORMAL LOW (ref 13.0–17.0)
MCH: 28.3 pg (ref 26.0–34.0)
MCHC: 32.5 g/dL (ref 30.0–36.0)
MCV: 86.9 fL (ref 78.0–100.0)
Platelets: 371 10*3/uL (ref 150–400)
RBC: 3.89 MIL/uL — ABNORMAL LOW (ref 4.22–5.81)
RDW: 13.3 % (ref 11.5–15.5)
WBC: 10.3 10*3/uL (ref 4.0–10.5)

## 2016-12-06 LAB — TROPONIN I: Troponin I: <0.03 ng/mL (ref <0.03)

## 2016-12-06 MED ORDER — SODIUM CHLORIDE 0.9% FLUSH
3.0000 mL | Freq: Two times a day (BID) | INTRAVENOUS | Status: DC
  Administered 2016-12-07 – 2016-12-08 (×2): 3 mL via INTRAVENOUS

## 2016-12-06 MED ORDER — BISACODYL 5 MG PO TBEC: 5.0000 mg | DELAYED_RELEASE_TABLET | Freq: Every day | ORAL | Status: DC | PRN

## 2016-12-06 MED ORDER — PREDNISONE 5 MG PO TABS
5.0000 mg | ORAL_TABLET | Freq: Every day | ORAL | Status: DC
  Administered 2016-12-07 – 2016-12-08 (×2): 5 mg via ORAL
  Filled 2016-12-06 (×2): qty 1

## 2016-12-06 MED ORDER — HYDROXYCHLOROQUINE SULFATE 200 MG PO TABS
200.0000 mg | ORAL_TABLET | Freq: Every day | ORAL | Status: DC
  Administered 2016-12-07: 200 mg via ORAL
  Filled 2016-12-06: qty 1

## 2016-12-06 MED ORDER — ONDANSETRON HCL 4 MG/2ML IJ SOLN: 4.0000 mg | Freq: Four times a day (QID) | INTRAMUSCULAR | Status: DC | PRN

## 2016-12-06 MED ORDER — ACETAMINOPHEN 650 MG RE SUPP: 650.0000 mg | Freq: Four times a day (QID) | RECTAL | Status: DC | PRN

## 2016-12-06 MED ORDER — DOCUSATE SODIUM 100 MG PO CAPS
100.0000 mg | ORAL_CAPSULE | Freq: Two times a day (BID) | ORAL | Status: DC
  Administered 2016-12-08: 100 mg via ORAL
  Filled 2016-12-06 (×5): qty 1

## 2016-12-06 MED ORDER — KETOROLAC TROMETHAMINE 30 MG/ML IJ SOLN: 30.0000 mg | Freq: Four times a day (QID) | INTRAMUSCULAR | Status: DC | PRN

## 2016-12-06 MED ORDER — VITAMIN D 1000 UNITS PO TABS
2000.0000 [IU] | ORAL_TABLET | Freq: Every day | ORAL | Status: DC
  Administered 2016-12-09: 2000 [IU] via ORAL
  Filled 2016-12-06 (×3): qty 2

## 2016-12-06 MED ORDER — MYCOPHENOLATE MOFETIL 250 MG PO CAPS
1500.0000 mg | ORAL_CAPSULE | Freq: Two times a day (BID) | ORAL | Status: DC
  Administered 2016-12-06 – 2016-12-09 (×6): 1500 mg via ORAL
  Filled 2016-12-06 (×6): qty 6

## 2016-12-06 MED ORDER — ENOXAPARIN SODIUM 40 MG/0.4ML ~~LOC~~ SOLN
40.0000 mg | Freq: Every day | SUBCUTANEOUS | Status: DC
  Filled 2016-12-06 (×2): qty 0.4

## 2016-12-06 MED ORDER — ACETAMINOPHEN 325 MG PO TABS: 650.0000 mg | ORAL_TABLET | Freq: Four times a day (QID) | ORAL | Status: DC | PRN

## 2016-12-06 MED ORDER — ASPIRIN EC 81 MG PO TBEC
81.0000 mg | DELAYED_RELEASE_TABLET | Freq: Every day | ORAL | Status: DC
  Administered 2016-12-07 – 2016-12-09 (×3): 81 mg via ORAL
  Filled 2016-12-06 (×3): qty 1

## 2016-12-06 MED ORDER — ONDANSETRON HCL 4 MG PO TABS: 4.0000 mg | ORAL_TABLET | Freq: Four times a day (QID) | ORAL | Status: DC | PRN

## 2016-12-06 MED ORDER — NIFEDIPINE ER OSMOTIC RELEASE 30 MG PO TB24
30.0000 mg | ORAL_TABLET | Freq: Every day | ORAL | Status: DC
  Administered 2016-12-07 – 2016-12-09 (×3): 30 mg via ORAL
  Filled 2016-12-06 (×3): qty 1

## 2016-12-06 MED ORDER — HYDROCODONE-ACETAMINOPHEN 5-325 MG PO TABS: 1.0000 | ORAL_TABLET | ORAL | Status: DC | PRN

## 2016-12-06 MED ORDER — POLYETHYLENE GLYCOL 3350 17 G PO PACK: 17.0000 g | PACK | Freq: Every day | ORAL | Status: DC | PRN

## 2016-12-06 NOTE — ED Notes (Signed)
Heart healthy frozen dinner with juice per pt requested

## 2016-12-06 NOTE — ED Provider Notes (Signed)
Emergency Department Provider Note   I have reviewed the triage vital signs and the nursing notes.   HISTORY  Chief Complaint Chest Pain  Seen at 1510  HPI Dan Adams is a 26 y.o. male with a, gave medical history that includes lupus, anxiety, leukopenia, hypertension, depression, rheumatoid arthritis presents to the emergency department today secondary to acute onset of chest pain and shortness of breath. Review of records and speaking with his rheumatologist, Dr. Aundria Rud, at [facility] this seems that the patient started having symptoms of couple weeks ago when he presented here and was found have a left pleural effusion is thought to be related possibly bronchitis versus lupus. He was started on antibiotics and steroids and subsequently had a echocardiogram on the 17th that showed a reduced ejection fraction of 45-50% along with stable left pleural effusion he was treated on antibiotics increase his colchicine over the next couple days and started doing better today he had the chest pain shortness of breath that seemed to be reminiscent of the last time. No fever but does have some cough. No rashes that are new. No recent travels. He had a CT scan done last time showed no pulmonary embolus. No other associated modifying symptoms. No history of the same.   Past Medical History:  Diagnosis Date  . Anxiety   . Childhood asthma   . Chronic back pain   . Depression   . Hypertension   . Leukopenia 09/07/2013  . Migraine    "@ least 2-3 times/wk now" (02/10/2015)  . RA (rheumatoid arthritis) (HCC)    "feet, fingers, knees; it's from my lupus" (02/10/2015)  . Stroke-like symptoms 02/09/2015-02/10/2015   "right-left"  . Systemic lupus (HCC)   . Thrombocytopenia, unspecified (HCC) 09/07/2013    Patient Active Problem List   Diagnosis Date Noted  . Pleural effusion 12/06/2016  . Fever, unspecified 04/30/2015  . Constipation 04/29/2015  . GERD (gastroesophageal reflux disease) 04/29/2015    . Generalized anxiety disorder 03/02/2015  . TIA (transient ischemic attack) 02/10/2015  . Stroke-like symptoms 02/10/2015  . Neurologic disorder   . Non compliance with medical treatment 12/28/2014  . Mouth ulcers 12/28/2014  . Hypertension, secondary 10/13/2014  . Vitamin B12 deficiency 08/17/2014  . Vitamin D deficiency 08/17/2014  . Lupus (systemic lupus erythematosus) (HCC) 08/13/2014  . Leukopenia 09/07/2013  . Thrombocytopenia, unspecified (HCC) 09/07/2013    Past Surgical History:  Procedure Laterality Date  . TONSILLECTOMY AND ADENOIDECTOMY  2007    Current Outpatient Rx  . Order #: [id number] Class: Normal  . Order #: [id number] Class: Normal  . Order #: [id number] Class: Print  . Order #: [id number] Class: Print  . Order #: [id number] Class: Print  . Order #: [id number] Class: Historical Med  . Order #: [id number] Class: Historical Med  . Order #: [id number] Class: Historical Med  . Order #: [id number] Class: Historical Med  . Order #: [id number] Class: Print    Allergies Promethazine  No family history on file.  Social History Social History  Substance Use Topics  . Smoking status: Current Every Day Smoker    Packs/day: 2.00    Years: 3.00    Types: Cigars, Cigarettes    Start date: 04/09/2010    Last attempt to quit: 01/06/2014  . Smokeless tobacco: Never Used     Comment: 02/10/2015 "quit smoking over 1 yr ago'  . Alcohol use No    Review of Systems Constitutional: No fever/chills Eyes: No visual changes. ENT: No sore throat. Cardiovascular: Positive chest pain. Respiratory: Positive shortness of breath.  Gastrointestinal: No abdominal pain.  No nausea, no vomiting.  No diarrhea.  No constipation. Genitourinary: Negative for dysuria. Musculoskeletal: Negative for back pain. Skin: Negative for rash. Neurological: Negative for headaches, focal weakness or numbness.  10-point ROS otherwise negative.  ____________________________________________   PHYSICAL  EXAM:  VITAL SIGNS: ED Triage Vitals  Enc Vitals Group     BP 12/06/16 1405 124/76     Pulse Rate 12/06/16 1405 (!) 117     Resp 12/06/16 1405 (!) 22     Temp 12/06/16 1405 99.6 F (37.6 C)     Temp Source 12/06/16 1405 Oral     SpO2 12/06/16 1405 100 %     Weight --      Height --      Head Circumference --      Peak Flow --      Pain Score 12/06/16 1402 7     Pain Loc --      Pain Edu? --      Excl. in GC? --     Constitutional: Alert and oriented. Well appearing and in no acute distress. Eyes: Conjunctivae are normal. PERRL. EOMI. Head: Atraumatic. Nose: No congestion/rhinnorhea. Mouth/Throat: Mucous membranes are moist.  Oropharynx non-erythematous. Neck: No stridor.  No meningeal signs.   Cardiovascular: Sinus Tachycardia, regular rhythm. Good peripheral circulation. Grossly normal heart sounds.   Respiratory: Normal respiratory effort.  No retractions. Decreased breath sounds on left. Gastrointestinal: Soft and nontender. No distention.  Musculoskeletal: No lower extremity tenderness nor edema. No gross deformities of extremities. Neurologic:  Normal speech and language. No gross focal neurologic deficits are appreciated.  Skin:  Skin is warm, dry and intact. No rash noted.   ____________________________________________   LABS (all labs ordered are listed, but only abnormal results are displayed)  Labs Reviewed  BASIC METABOLIC PANEL - Abnormal; Notable for the following:       Result Value   Sodium 133 (*)    Chloride 98 (*)    Glucose, Bld 119 (*)    All other components within normal limits  CBC - Abnormal; Notable for the following:    RBC 3.89 (*)    Hemoglobin 11.0 (*)    HCT 33.8 (*)    All other components within normal limits  BRAIN NATRIURETIC PEPTIDE - Abnormal; Notable for the following:    B Natriuretic Peptide 172.0 (*)    All other components within normal limits  TROPONIN I   ____________________________________________  EKG   EKG  Interpretation  Date/Time:  Thursday December 06 2016 14:08:29 EDT Ventricular Rate:  115 PR Interval:    QRS Duration: 101 QT Interval:  316 QTC Calculation: 437 R Axis:   46 Text Interpretation:  Sinus tachycardia Probable left atrial enlargement RSR' in V1 or V2, right VCD or RVH Abnormal T, consider ischemia, diffuse leads when compared to prior, t wave inversion in lead 1.  No STEMI Confirmed by Theda Belfast ([id number]) on 12/06/2016 2:14:44 PM Also confirmed by Theda Belfast ([id number]), editor Misty Stanley [phone])  on 12/06/2016 3:25:34 PM      ____________________________________________  RADIOLOGY  Dg Chest 2 View  Result Date: 12/06/2016 CLINICAL DATA:  26 year old with current history of lupus, presenting with recurrent chest pain and shortness of breath which began acutely today. Patient was seen 2 weeks ago with a small left pleural effusion and a moderately large pericardial effusion. EXAM: CHEST  2 VIEW COMPARISON:  CTA chest 11/21/2016, 05/11/2015. Chest x-rays 11/21/2016, 02/03/2016 and earlier. FINDINGS: Cardiac  silhouette markedly enlarged, unchanged from the examination 2 weeks ago. Interval marked increase in size of the now moderately large left pleural effusion. No evidence of right pleural effusion. Associated passive atelectasis in the left lower lobe. Lungs otherwise clear. Pulmonary vascularity normal. Visualized bony thorax intact. IMPRESSION: 1. Stable marked enlargement of the cardiac silhouette, shown on CTA chest 2 weeks ago to be due to a moderately large pericardial effusion. 2. Interval increase in size of the left pleural effusion since the examinations 2 weeks ago. 3. Associated passive atelectasis in the left lower lobe. Electronically Signed   By: Hulan Saas M.D.   On: 12/06/2016 14:27    ____________________________________________   PROCEDURES  Procedure(s) performed:    Procedures   ____________________________________________   INITIAL IMPRESSION / ASSESSMENT AND PLAN / ED COURSE  Pertinent labs & imaging results that were available during my care of the patient were reviewed by me and considered in my medical decision making (see chart for details).  Patient with a worsening left pleural effusion which I think is related to his charts of breath and chest pain. A bedside ultrasound showed a minimal pericardial effusion which seems like is probably stable. No tapable fluid collection in the left lung.  ____________________________________________  FINAL CLINICAL IMPRESSION(S) / ED DIAGNOSES  Final diagnoses:  Pleural effusion  Nonspecific chest pain     MEDICATIONS GIVEN DURING THIS VISIT:  Medications - No data to display   NEW OUTPATIENT MEDICATIONS STARTED DURING THIS VISIT:  New Prescriptions   No medications on file    Note:  This document was prepared using Dragon voice recognition software and may include unintentional dictation errors.    Marily Memos, MD 12/06/16 [phone]

## 2016-12-06 NOTE — ED Triage Notes (Addendum)
C/o recurrent CP-states he was seen here last week for same-has been told "i have fluid in my lungs and around my heart"-NAD-steady gait

## 2016-12-06 NOTE — Progress Notes (Signed)
  PENDING ACCEPTANCE TRANFER NOTE:  Call received from:    Dr. Clayborne Dana  REASON FOR REQUESTING TRANSFER: Pleural effusion  CC: SOB  HPI:   26 year old with history of lupus and pneumonia came to the hospital complaining about shortness of breath. He was recently seen in the hospital treated for ? bronchitis and sent home to follow-up with his rheumatologist at [facility]. He came in today with worsening pleural effusion, possible pericardial effusion and SOB. Discussed with Dr. Erin Hearing, family want him close to home, do not want [facility] for now, there is a good chance he might need rheumatology support in the hospital. He will likely need thoracentesis, recent 2-D echo showed EF of 45-50% with G2DD.  Vitals:   12/06/16 1405 12/06/16 1600  BP: 124/76 118/84  Pulse: (!) 117 (!) 112  Resp: (!) 22 (!) 30  Temp: 99.6 F (37.6 C)   SpO2: 100% 97%    PLAN:  According to telephone report, this patient was accepted for transfer to [facility], under TRH team:  [facility],  I have requested an order be written to call Flow Manager at [phone] upon patient arrival to the floor for final physician assignment who will do the admission and give admitting orders.  SIGNED: Clint Lipps, MD [facility]  12/06/2016, 5:51 PM

## 2016-12-06 NOTE — H&P (Signed)
History and Physical    Davelle Anselmi ITG:[id number] DOB: [birth year] DOA: 12/06/2016  PCP: Tresa Garter, MD   Patient coming from: Home, by way of [facility] ED   Chief Complaint: Chest pain, SOB  HPI: Kanton Kamel is a 26 y.o. male with medical history significant for lupus and hypertension, presenting to the emergency department with chest pain and dyspnea. Patient had experienced the same symptoms approximately 2 weeks ago, was evaluated in the emergency department at that time, and underwent CTA chest that demonstrated a moderate pericardial effusion and a tiny left-sided pleural effusion. He followed up with his rheumatologist at [facility] regarding this, and she felt confident that the effusions are secondary to lupus. There were small at that time and did not require intervention. Symptoms resolved over the next couple days but returned last night. He denies fevers or chills, describes his pain as constant, dull, aching, much worse with cough, little bit worse with deep inspiration, involving the left lateral chest and left upper back. There is associated shortness of breath, worse with exertion, and a cough productive of scant white sputum. He denies fevers or chills. He denies lower extremity swelling or tenderness.  [facility] ED Course: Upon arrival to the ED, patient is found to be afebrile, saturating well on room air, slightly tachypneic, mildly tachycardic, and with stable blood pressure. EKG features a sinus tachycardia with rate 115 and diffuse T-wave inversions, similar to priors. Chest x-ray demonstrates stable marked enlargement of the cardiac silhouette and increased left pleural effusion from 2 weeks ago. Chemstrip panel reveals a sodium of 133 and a creatinine 1.11, consistent with his priors. CBC is notable for a stable chronic normocytic anemia with hemoglobin of 11.0. BNP is elevated 272 and troponin is undetectable. Patient remained hemodynamically stable in  the ED and in no apparent respiratory distress. He will be observed on the telemetry unit for ongoing evaluation and management of chest pain suspected secondary to large left pleural effusion, and with possible contribution from the pericardial effusion.  Review of Systems:  All other systems reviewed and apart from HPI, are negative.  Past Medical History:  Diagnosis Date  . Anxiety   . Childhood asthma   . Chronic back pain   . Depression   . Hypertension   . Leukopenia 09/07/2013  . Migraine    "@ least 2-3 times/wk now" (02/10/2015)  . RA (rheumatoid arthritis) (HCC)    "feet, fingers, knees; it's from my lupus" (02/10/2015)  . Stroke-like symptoms 02/09/2015-02/10/2015   "right-left"  . Systemic lupus (HCC)   . Thrombocytopenia, unspecified (HCC) 09/07/2013    Past Surgical History:  Procedure Laterality Date  . TONSILLECTOMY AND ADENOIDECTOMY  2007     reports that he has been smoking Cigars and Cigarettes.  He started smoking about 6 years ago. He has a 6.00 pack-year smoking history. He has never used smokeless tobacco. He reports that he does not drink alcohol or use drugs.  Allergies  Allergen Reactions  . Promethazine Nausea And Vomiting    Family History  Problem Relation Age of Onset  . Lupus Neg Hx      Prior to Admission medications   Medication Sig Start Date End Date Taking? Authorizing Provider  aspirin EC 81 MG EC tablet Take 1 tablet (81 mg total) by mouth daily. 02/12/15   Leroy Sea, MD  Cholecalciferol (VITAMIN D3) 2000 UNITS capsule Take 1 capsule (2,000 Units total) by mouth daily. 12/28/14   Plotnikov, Macarthur Critchley  V, MD  cyanocobalamin (COBAL-1000) 1000 MCG/ML injection Inject 1 mL (1,000 mcg total) into the muscle every 14 (fourteen) days. 03/02/15   Plotnikov, Georgina Quint, MD  docusate sodium (COLACE) 100 MG capsule Take 1 capsule (100 mg total) by mouth every 12 (twelve) hours. 05/25/16   Doug Sou, MD  hydroxychloroquine (PLAQUENIL) 200 MG  tablet Take 1 tablet by mouth daily. 04/13/15   [provider]  mycophenolate (CELLCEPT) 500 MG tablet Take 1,500 mg by mouth 2 (two) times daily.    [provider]  NIFEdipine (PROCARDIA-XL/ADALAT-CC/NIFEDICAL-XL) 30 MG 24 hr tablet Take 30 mg by mouth daily.    [provider]  predniSONE (DELTASONE) 5 MG tablet Take 1 tablet by mouth daily. 04/13/15   [provider]  Syringe/Needle, Disp, (B-D ECLIPSE SYRINGE) 30G X 1/2" 1 ML MISC 1 each by Does not apply route 1 day or 1 dose. For B12 injections 03/02/15   Plotnikov, Georgina Quint, MD    Physical Exam: Vitals:   12/06/16 1830 12/06/16 1930 12/06/16 2042 12/06/16 2048  BP: 123/82 120/80  119/78  Pulse:  (!) 107  (!) 109  Resp: (!) 41 (!) 39    Temp:    99.6 F (37.6 C)  TempSrc:    Oral  SpO2:  98%  99%  Weight:   80.8 kg (178 lb 3.2 oz)   Height:   5\' 5"  (1.651 m)       Constitutional: NAD, calm, appears uncomfortable. Eyes: PERTLA, lids and conjunctivae normal ENMT: Mucous membranes are moist. Posterior pharynx clear of any exudate or lesions.   Neck: normal, supple, no masses, no thyromegaly Respiratory: Markedly diminished on left. No pallor. No accessory muscle use.  Cardiovascular: Rate ~110 and regular. No rub appreciated. No extremity edema. No significant JVD. Abdomen: No distension, no tenderness, no masses palpated. Bowel sounds normal.  Musculoskeletal: no clubbing / cyanosis. No joint deformity upper and lower extremities.  Skin: no significant rashes, lesions, ulcers. Warm, dry, well-perfused. Neurologic: CN 2-12 grossly intact. Sensation intact, DTR normal. Strength 5/5 in all 4 limbs.  Psychiatric: Alert and oriented x 3. Pleasant and cooperative.     Labs on Admission: I have personally reviewed following labs and imaging studies  CBC:  Recent Labs Lab 12/06/16 1557 12/07/16 0203  WBC 10.3 7.6  HGB 11.0* 11.1*  HCT 33.8* 34.6*  MCV 86.9 86.7  PLT 371 380   Basic  Metabolic Panel:  Recent Labs Lab 12/06/16 1557 12/07/16 0203  NA 133* 136  K 3.7 3.3*  CL 98* 101  CO2 24 25  GLUCOSE 119* 108*  BUN 18 17  CREATININE 1.11 1.07  CALCIUM 9.1 9.1   GFR: Estimated Creatinine Clearance: 101.5 mL/min (by C-G formula based on SCr of 1.07 mg/dL). Liver Function Tests: No results for input(s): AST, ALT, ALKPHOS, BILITOT, PROT, ALBUMIN in the last 168 hours. No results for input(s): LIPASE, AMYLASE in the last 168 hours. No results for input(s): AMMONIA in the last 168 hours. Coagulation Profile: No results for input(s): INR, PROTIME in the last 168 hours. Cardiac Enzymes:  Recent Labs Lab 12/06/16 1557  TROPONINI <0.03   BNP (last 3 results) No results for input(s): PROBNP in the last 8760 hours. HbA1C: No results for input(s): HGBA1C in the last 72 hours. CBG: No results for input(s): GLUCAP in the last 168 hours. Lipid Profile: No results for input(s): CHOL, HDL, LDLCALC, TRIG, CHOLHDL, LDLDIRECT in the last 72 hours. Thyroid Function Tests: No results for  input(s): TSH, T4TOTAL, FREET4, T3FREE, THYROIDAB in the last 72 hours. Anemia Panel: No results for input(s): VITAMINB12, FOLATE, FERRITIN, TIBC, IRON, RETICCTPCT in the last 72 hours. Urine analysis:    Component Value Date/Time   COLORURINE YELLOW 05/11/2015 0458   APPEARANCEUR CLEAR 05/11/2015 0458   LABSPEC 1.030 05/11/2015 0458   PHURINE 6.0 05/11/2015 0458   GLUCOSEU NEGATIVE 05/11/2015 0458   GLUCOSEU NEGATIVE 04/29/2015 0923   HGBUR MODERATE (A) 05/11/2015 0458   BILIRUBINUR NEGATIVE 05/11/2015 0458   KETONESUR NEGATIVE 05/11/2015 0458   PROTEINUR >300 (A) 05/11/2015 0458   UROBILINOGEN 0.2 04/29/2015 0923   NITRITE NEGATIVE 05/11/2015 0458   LEUKOCYTESUR NEGATIVE 05/11/2015 0458   Sepsis Labs: @LABRCNTIP (procalcitonin:4,lacticidven:4) )No results found for this or any previous visit (from the past 240 hour(s)).   Radiological Exams on Admission: Dg Chest 2  View  Result Date: 12/06/2016 CLINICAL DATA:  26 year old with current history of lupus, presenting with recurrent chest pain and shortness of breath which began acutely today. Patient was seen 2 weeks ago with a small left pleural effusion and a moderately large pericardial effusion. EXAM: CHEST  2 VIEW COMPARISON:  CTA chest 11/21/2016, 05/11/2015. Chest x-rays 11/21/2016, 02/03/2016 and earlier. FINDINGS: Cardiac silhouette markedly enlarged, unchanged from the examination 2 weeks ago. Interval marked increase in size of the now moderately large left pleural effusion. No evidence of right pleural effusion. Associated passive atelectasis in the left lower lobe. Lungs otherwise clear. Pulmonary vascularity normal. Visualized bony thorax intact. IMPRESSION: 1. Stable marked enlargement of the cardiac silhouette, shown on CTA chest 2 weeks ago to be due to a moderately large pericardial effusion. 2. Interval increase in size of the left pleural effusion since the examinations 2 weeks ago. 3. Associated passive atelectasis in the left lower lobe. Electronically Signed   By: Hulan Saas M.D.   On: 12/06/2016 14:27    EKG: Independently reviewed. Sinus tachycardia (rate 115), RSR' in V1 and V2, T-wave inversions. No significant change from priors.   Assessment/Plan  1. Left pleural effusion - Pt presents with pain in left chest and left upper back, much worse with cough  - Found to have interval increase in left pleural effusion  - Likely secondary to SLE  - No leukocytosis, fever, or associated consolidation on imaging  - Will consult IR for therapeutic thoracentesis   2. Pericardial effusion  - Pt noted to have moderate pericardial effusion on CTA chest 2 weeks ago  - There is marked enlargement of cardiac silhouette on CXR - No clinical signs of tamponade, will check echocardiogram    3. Lupus  - Follows with rheumatology at [facility]  - Continue Cellcept, Plaquenil, prednisone 5 mg qD   4.  Hypertension  - BP at goal  - Continue nifedipine as tolerated   5. Anemia of chronic disease - Hgb is 11.0 on admission   - Hgb is stable and there is no apparent bleeding  - Likely secondary to chronic disease     DVT prophylaxis: sq Lovenox Code Status: Full  Family Communication: Discussed with patient Disposition Plan: Observe on telemetry Consults called: None Admission status: Observation    Briscoe Deutscher, MD [facility] Pager [phone]  If 7PM-7AM, please contact night-coverage [email or web address] Password [facility]  12/07/2016, 4:43 AM

## 2016-12-07 ENCOUNTER — Observation Stay (HOSPITAL_BASED_OUTPATIENT_CLINIC_OR_DEPARTMENT_OTHER): Payer: BLUE CROSS/BLUE SHIELD

## 2016-12-07 ENCOUNTER — Observation Stay (HOSPITAL_COMMUNITY): Payer: BLUE CROSS/BLUE SHIELD

## 2016-12-07 ENCOUNTER — Encounter (HOSPITAL_COMMUNITY): Payer: Self-pay | Admitting: Family Medicine

## 2016-12-07 DIAGNOSIS — E876 Hypokalemia: Secondary | ICD-10-CM | POA: Diagnosis present

## 2016-12-07 DIAGNOSIS — I313 Pericardial effusion (noninflammatory): Secondary | ICD-10-CM

## 2016-12-07 DIAGNOSIS — I159 Secondary hypertension, unspecified: Secondary | ICD-10-CM | POA: Diagnosis not present

## 2016-12-07 DIAGNOSIS — D649 Anemia, unspecified: Secondary | ICD-10-CM

## 2016-12-07 DIAGNOSIS — F411 Generalized anxiety disorder: Secondary | ICD-10-CM | POA: Diagnosis present

## 2016-12-07 DIAGNOSIS — Z888 Allergy status to other drugs, medicaments and biological substances status: Secondary | ICD-10-CM | POA: Diagnosis not present

## 2016-12-07 DIAGNOSIS — M069 Rheumatoid arthritis, unspecified: Secondary | ICD-10-CM | POA: Diagnosis present

## 2016-12-07 DIAGNOSIS — E669 Obesity, unspecified: Secondary | ICD-10-CM | POA: Diagnosis present

## 2016-12-07 DIAGNOSIS — Z7952 Long term (current) use of systemic steroids: Secondary | ICD-10-CM | POA: Diagnosis not present

## 2016-12-07 DIAGNOSIS — R000 Tachycardia, unspecified: Secondary | ICD-10-CM | POA: Diagnosis present

## 2016-12-07 DIAGNOSIS — Z7982 Long term (current) use of aspirin: Secondary | ICD-10-CM | POA: Diagnosis not present

## 2016-12-07 DIAGNOSIS — J90 Pleural effusion, not elsewhere classified: Secondary | ICD-10-CM | POA: Diagnosis present

## 2016-12-07 DIAGNOSIS — I429 Cardiomyopathy, unspecified: Secondary | ICD-10-CM

## 2016-12-07 DIAGNOSIS — I10 Essential (primary) hypertension: Secondary | ICD-10-CM | POA: Diagnosis present

## 2016-12-07 DIAGNOSIS — R079 Chest pain, unspecified: Secondary | ICD-10-CM | POA: Diagnosis not present

## 2016-12-07 DIAGNOSIS — Z9889 Other specified postprocedural states: Secondary | ICD-10-CM | POA: Diagnosis not present

## 2016-12-07 DIAGNOSIS — F17210 Nicotine dependence, cigarettes, uncomplicated: Secondary | ICD-10-CM | POA: Diagnosis present

## 2016-12-07 DIAGNOSIS — M329 Systemic lupus erythematosus, unspecified: Secondary | ICD-10-CM | POA: Diagnosis present

## 2016-12-07 DIAGNOSIS — Z6832 Body mass index (BMI) 32.0-32.9, adult: Secondary | ICD-10-CM | POA: Diagnosis not present

## 2016-12-07 DIAGNOSIS — D638 Anemia in other chronic diseases classified elsewhere: Secondary | ICD-10-CM | POA: Diagnosis present

## 2016-12-07 DIAGNOSIS — F17290 Nicotine dependence, other tobacco product, uncomplicated: Secondary | ICD-10-CM | POA: Diagnosis present

## 2016-12-07 DIAGNOSIS — Z79899 Other long term (current) drug therapy: Secondary | ICD-10-CM | POA: Diagnosis not present

## 2016-12-07 HISTORY — PX: IR THORACENTESIS ASP PLEURAL SPACE W/IMG GUIDE: IMG5380

## 2016-12-07 LAB — BODY FLUID CELL COUNT WITH DIFFERENTIAL
Eos, Fluid: 0 %
LYMPHS FL: 2 %
MONOCYTE-MACROPHAGE-SEROUS FLUID: 10 % — AB (ref 50–90)
Neutrophil Count, Fluid: 88 % — ABNORMAL HIGH (ref 0–25)
Total Nucleated Cell Count, Fluid: 13700 cu mm — ABNORMAL HIGH (ref 0–1000)

## 2016-12-07 LAB — HIV ANTIBODY (ROUTINE TESTING W REFLEX): HIV Screen 4th Generation wRfx: NONREACTIVE

## 2016-12-07 LAB — BASIC METABOLIC PANEL
Anion gap: 10 (ref 5–15)
BUN: 17 mg/dL (ref 6–20)
CHLORIDE: 101 mmol/L (ref 101–111)
CO2: 25 mmol/L (ref 22–32)
CREATININE: 1.07 mg/dL (ref 0.61–1.24)
Calcium: 9.1 mg/dL (ref 8.9–10.3)
GFR calc non Af Amer: >60 mL/min (ref >60)
Glucose, Bld: 108 mg/dL — ABNORMAL HIGH (ref 65–99)
Potassium: 3.3 mmol/L — ABNORMAL LOW (ref 3.5–5.1)
SODIUM: 136 mmol/L (ref 135–145)

## 2016-12-07 LAB — CBC
HEMATOCRIT: 34.6 % — AB (ref 39.0–52.0)
HEMOGLOBIN: 11.1 g/dL — AB (ref 13.0–17.0)
MCH: 27.8 pg (ref 26.0–34.0)
MCHC: 32.1 g/dL (ref 30.0–36.0)
MCV: 86.7 fL (ref 78.0–100.0)
Platelets: 380 10*3/uL (ref 150–400)
RBC: 3.99 MIL/uL — ABNORMAL LOW (ref 4.22–5.81)
RDW: 13.6 % (ref 11.5–15.5)
WBC: 7.6 10*3/uL (ref 4.0–10.5)

## 2016-12-07 LAB — GRAM STAIN

## 2016-12-07 LAB — ECHOCARDIOGRAM LIMITED
HEIGHTINCHES: 65 in
WEIGHTICAEL: 3132.3 [oz_av]

## 2016-12-07 LAB — EXPECTORATED SPUTUM ASSESSMENT W GRAM STAIN, RFLX TO RESP C

## 2016-12-07 MED ORDER — HYDROXYCHLOROQUINE SULFATE 200 MG PO TABS
400.0000 mg | ORAL_TABLET | Freq: Every day | ORAL | Status: DC
  Administered 2016-12-08 – 2016-12-09 (×2): 400 mg via ORAL
  Filled 2016-12-07 (×2): qty 2

## 2016-12-07 MED ORDER — LIDOCAINE HCL (PF) 1 % IJ SOLN
INTRAMUSCULAR | Status: AC
  Filled 2016-12-07: qty 30

## 2016-12-07 MED ORDER — LIDOCAINE HCL (PF) 1 % IJ SOLN
INTRAMUSCULAR | Status: DC | PRN
Start: 2016-12-07 — End: 2016-12-09
  Administered 2016-12-07: 10 mL

## 2016-12-07 MED ORDER — HYDROXYCHLOROQUINE SULFATE 200 MG PO TABS
200.0000 mg | ORAL_TABLET | Freq: Once | ORAL | Status: AC
  Administered 2016-12-07: 200 mg via ORAL
  Filled 2016-12-07: qty 1

## 2016-12-07 MED ORDER — POTASSIUM CHLORIDE 10 MEQ/100ML IV SOLN
10.0000 meq | INTRAVENOUS | Status: AC
  Administered 2016-12-07 (×2): 10 meq via INTRAVENOUS
  Filled 2016-12-07 (×2): qty 100

## 2016-12-07 NOTE — Progress Notes (Signed)
  Echocardiogram 2D Echocardiogram has been performed.  Faustina Gebert T Othell Diluzio 12/07/2016, 8:34 AM

## 2016-12-07 NOTE — Procedures (Signed)
PROCEDURE SUMMARY:  Successful US guided left diagnostic thoracentesis. Yielded 200 mL of blood-tinged fluid. Pt tolerated procedure well. No immediate complications.  Specimen was sent for labs. CXR ordered.  Hoyt Koch PA-C 12/07/2016 11:51 AM

## 2016-12-07 NOTE — Progress Notes (Signed)
Went to see patient 11:05 and was asked to come back after 12. Went to patient at 2:45 and patient was not ready to do anything-wanted to call mother. I gave AD form packet for him to view and go over at a later time. Lupita Leash

## 2016-12-07 NOTE — Progress Notes (Signed)
PROGRESS NOTE    Dan Adams  SEG:[id number] DOB: November 27, 1990 DOA: 12/06/2016 PCP: Tresa Garter, MD    Brief Narrative: Dan Adams is a 26 y.o. male with medical history significant for lupus and hypertension, presenting to the emergency department with chest pain and dyspnea. Patient had experienced the same symptoms approximately 2 weeks ago, was evaluated in the emergency department at that time, and underwent CTA chest that demonstrated a moderate pericardial effusion and a tiny left-sided pleural effusion. He followed up with his rheumatologist at [facility] regarding this, and she felt confident that the effusions are secondary to lupus. There were small at that time and did not require intervention. Symptoms resolved over the next couple days but returned since 2 days. He came to [facility], was found to have marked  enlargement of the cardiac silhouette and increased left pleural effusion on CXR, he was referred to [facility] for admission. Cardiology consulted to evaluate for pericardial effusion. IR consulted for left thoracentesis.   Assessment & Plan:   Principal Problem:   Chest pain Active Problems:   Lupus (systemic lupus erythematosus) (HCC)   Hypertension, secondary   Generalized anxiety disorder   Pleural effusion   Pericardial effusion   Normocytic anemia   Dyspnea at rest and on exertion:  From pericardial and pleural effusions from SLE.  Currently chest pain free.  No fever or chills. No pneumonia on CXR.  IR consulted for therapeutic and diagnostic thoracentesis.    Pericardial effusion:  No tamponade physiology as per prelim report from echo.  Echocardiogram pending.  Cardiology consulted.    Lupus:  Follow up at [facility].  Currently on prednisone. Plaquenil and cell cept.    Hypertension;  Well controlled.   Hypokalemia: replaced.    Normocytic anemia/ anemia of chronic disease:  Hemoglobin stable around 11.      DVT prophylaxis: (Lovenox) Code  Status: (Full/) Family Communication: none at bedside. ) Disposition Plan: (pending further eval by IR and cardiology.   Consultants:   Cardiology  IR   Procedures:  Echocardiogram.    Antimicrobials:    Subjective: Reports having sob, dyspneic on talking.  No chest pain.   Objective: Vitals:   12/06/16 2042 12/06/16 2048 12/07/16 0612 12/07/16 0746  BP:  119/78 120/72   Pulse:  (!) 109 (!) 106   Resp:   17   Temp:  99.6 F (37.6 C) 98.1 F (36.7 C) 99.3 F (37.4 C)  TempSrc:  Oral  Oral  SpO2:  99% 99% 99%  Weight: 80.8 kg (178 lb 3.2 oz)  88.8 kg (195 lb 12.3 oz)   Height: 5\' 5"  (1.651 m)       Intake/Output Summary (Last 24 hours) at 12/07/16 0943 Last data filed at 12/07/16 0644  Gross per 24 hour  Intake              340 ml  Output                0 ml  Net              340 ml   Filed Weights   12/06/16 2042 12/07/16 0612  Weight: 80.8 kg (178 lb 3.2 oz) 88.8 kg (195 lb 12.3 oz)    Examination:  General exam: Appears calm and comfortable  Respiratory system: right side diminished,no wheezing heard.  Cardiovascular system: S1 & S2 heard, RRR. No JVD, murmurs, rubs, gallops or clicks. No pedal edema. Gastrointestinal system: Abdomen is nondistended, soft and nontender.  No organomegaly or masses felt. Normal bowel sounds heard. Central nervous system: Alert and oriented. No focal neurological deficits. Extremities: Symmetric 5 x 5 power. Skin: No rashes, lesions or ulcers Psychiatry: Judgement and insight appear normal. Mood & affect appropriate.     Data Reviewed: I have personally reviewed following labs and imaging studies  CBC:  Recent Labs Lab 12/06/16 1557 12/07/16 0203  WBC 10.3 7.6  HGB 11.0* 11.1*  HCT 33.8* 34.6*  MCV 86.9 86.7  PLT 371 380   Basic Metabolic Panel:  Recent Labs Lab 12/06/16 1557 12/07/16 0203  NA 133* 136  K 3.7 3.3*  CL 98* 101  CO2 24 25  GLUCOSE 119* 108*  BUN 18 17  CREATININE 1.11 1.07  CALCIUM  9.1 9.1   GFR: Estimated Creatinine Clearance: 106.2 mL/min (by C-G formula based on SCr of 1.07 mg/dL). Liver Function Tests: No results for input(s): AST, ALT, ALKPHOS, BILITOT, PROT, ALBUMIN in the last 168 hours. No results for input(s): LIPASE, AMYLASE in the last 168 hours. No results for input(s): AMMONIA in the last 168 hours. Coagulation Profile: No results for input(s): INR, PROTIME in the last 168 hours. Cardiac Enzymes:  Recent Labs Lab 12/06/16 1557  TROPONINI <0.03   BNP (last 3 results) No results for input(s): PROBNP in the last 8760 hours. HbA1C: No results for input(s): HGBA1C in the last 72 hours. CBG: No results for input(s): GLUCAP in the last 168 hours. Lipid Profile: No results for input(s): CHOL, HDL, LDLCALC, TRIG, CHOLHDL, LDLDIRECT in the last 72 hours. Thyroid Function Tests: No results for input(s): TSH, T4TOTAL, FREET4, T3FREE, THYROIDAB in the last 72 hours. Anemia Panel: No results for input(s): VITAMINB12, FOLATE, FERRITIN, TIBC, IRON, RETICCTPCT in the last 72 hours. Sepsis Labs: No results for input(s): PROCALCITON, LATICACIDVEN in the last 168 hours.  Recent Results (from the past 240 hour(s))  Culture, sputum-assessment     Status: None   Collection Time: 12/07/16  6:41 AM  Result Value Ref Range Status   Specimen Description SPUTUM  Final   Special Requests Immunocompromised  Final   Sputum evaluation   Final    Sputum specimen not acceptable for testing.  Please recollect.   Gram Stain Report Called to,Read Back By and Verified With: T.T[place]ILLE RN [phone] HDAY    Report Status 12/07/2016 FINAL  Final         Radiology Studies: Dg Chest 2 View  Result Date: 12/06/2016 CLINICAL DATA:  26 year old with current history of lupus, presenting with recurrent chest pain and shortness of breath which began acutely today. Patient was seen 2 weeks ago with a small left pleural effusion and a moderately large pericardial effusion.  EXAM: CHEST  2 VIEW COMPARISON:  CTA chest 11/21/2016, 05/11/2015. Chest x-rays 11/21/2016, 02/03/2016 and earlier. FINDINGS: Cardiac silhouette markedly enlarged, unchanged from the examination 2 weeks ago. Interval marked increase in size of the now moderately large left pleural effusion. No evidence of right pleural effusion. Associated passive atelectasis in the left lower lobe. Lungs otherwise clear. Pulmonary vascularity normal. Visualized bony thorax intact. IMPRESSION: 1. Stable marked enlargement of the cardiac silhouette, shown on CTA chest 2 weeks ago to be due to a moderately large pericardial effusion. 2. Interval increase in size of the left pleural effusion since the examinations 2 weeks ago. 3. Associated passive atelectasis in the left lower lobe. Electronically Signed   By: Hulan Saas M.D.   On: 12/06/2016 14:27  Scheduled Meds: . aspirin EC  81 mg Oral Daily  . cholecalciferol  2,000 Units Oral Daily  . docusate sodium  100 mg Oral BID  . enoxaparin (LOVENOX) injection  40 mg Subcutaneous QHS  . hydroxychloroquine  200 mg Oral Daily  . mycophenolate  1,500 mg Oral BID  . NIFEdipine  30 mg Oral Daily  . predniSONE  5 mg Oral Q breakfast  . sodium chloride flush  3 mL Intravenous Q12H   Continuous Infusions:   LOS: 0 days    Time spent: 35 minutes.     Kathlen Mody, MD [facility] Pager [phone]   If 7PM-7AM, please contact night-coverage [email or web address] Password [facility] 12/07/2016, 9:43 AM

## 2016-12-07 NOTE — Consult Note (Signed)
Cardiology Consultation:   Patient ID: Dan Adams; [id number]; 1990-10-17   Admit date: 12/06/2016 Date of Consult: 12/07/2016  Primary Care Provider: Tresa Garter, MD Primary Cardiologist: New to [facility] (Dr. Royann Shivers)   Patient Profile:   Dan Adams is a 26 y.o. male with a hx of SLE (followed rheumatologist at [facility]), hypertension and artery who is being seen today for the evaluation of chest pain/pericardial effusion at the request of Dr. Blake Divine.   Seen in emergency room 8/15 for chest pain and shortness of breath. CTA negative for PE however showed mild left-sided pleural effusion and moderate pericardial effusion. Advised to follow-up with rheumatologist at [facility]. He was seen by his rheumatologist 11/23/16. He was not taking his Cellcept as prescribed. Discussed importance of compliance at length. Repeat chest x-ray suggesting pericardial effusion. Echocardiogram showed mild pericardial effusion with left ventricular EF of 40-50% and mild to moderate mitral regurgitation. He was referred to cardiologist.  History of Present Illness:   Dan Adams felt good for few days after seen by rheumatologist. Then he had a recurrent symptoms of chest pain and shortness of breath intermittently with and without exertion. Yesterday he had a constant dull/sharp chest pain with shortness of breath leading to ER presentation for further evaluation. His left-sided chest pain is worse with deep breath and movement. Currently stable at blood pressure of 120/72 at rate of 106 bpm. No fever. He denies family history of CAD, stroke or lupus.   Chest x-ray in ER shows increased left pleural effusion compared to 2 weeks ago. BNP 272. Troponin negative. No evidence of leukocytosis. Potassium 3.3 today.  EKG:  The EKG was personally reviewed and demonstrates:  Sinus tachycardia at rate of 115 bpm with TWI inferior laterally (new in lead I) ? Repolarization abnormality.  Telemetry:   Telemetry was personally reviewed and demonstrates: sinus tachycardia at rate of 100s  Past Medical History:  Diagnosis Date  . Anxiety   . Childhood asthma   . Chronic back pain   . Depression   . Hypertension   . Leukopenia 09/07/2013  . Migraine    "@ least 2-3 times/wk now" (02/10/2015)  . RA (rheumatoid arthritis) (HCC)    "feet, fingers, knees; it's from my lupus" (02/10/2015)  . Stroke-like symptoms 02/09/2015-02/10/2015   "right-left"  . Systemic lupus (HCC)   . Thrombocytopenia, unspecified (HCC) 09/07/2013    Past Surgical History:  Procedure Laterality Date  . TONSILLECTOMY AND ADENOIDECTOMY  2007     Inpatient Medications: Scheduled Meds: . aspirin EC  81 mg Oral Daily  . cholecalciferol  2,000 Units Oral Daily  . docusate sodium  100 mg Oral BID  . enoxaparin (LOVENOX) injection  40 mg Subcutaneous QHS  . hydroxychloroquine  200 mg Oral Daily  . mycophenolate  1,500 mg Oral BID  . NIFEdipine  30 mg Oral Daily  . predniSONE  5 mg Oral Q breakfast  . sodium chloride flush  3 mL Intravenous Q12H   Continuous Infusions: . potassium chloride 10 mEq (12/07/16 0735)   PRN Meds: acetaminophen **OR** acetaminophen, bisacodyl, HYDROcodone-acetaminophen, ketorolac, ondansetron **OR** ondansetron (ZOFRAN) IV, polyethylene glycol  Allergies:    Allergies  Allergen Reactions  . Promethazine Nausea And Vomiting    Social History:   Social History   Social History  . Marital status: Single    Spouse name: N/A  . Number of children: N/A  . Years of education: N/A   Occupational History  . Not on file.  Social History Main Topics  . Smoking status: Current Every Day Smoker    Packs/day: 2.00    Years: 3.00    Types: Cigars, Cigarettes    Start date: 04/09/2010    Last attempt to quit: 01/06/2014  . Smokeless tobacco: Never Used     Comment: 02/10/2015 "quit smoking over 1 yr ago'  . Alcohol use No  . Drug use: No  . Sexual activity: Not on file   Other Topics  Concern  . Not on file   Social History Narrative  . No narrative on file    Family History:    Family History  Problem Relation Age of Onset  . Lupus Neg Hx      ROS:  Please see the history of present illness.  ROS All other ROS reviewed and negative.     Physical Exam/Data:   Vitals:   12/06/16 2042 12/06/16 2048 12/07/16 0612 12/07/16 0746  BP:  119/78 120/72   Pulse:  (!) 109 (!) 106   Resp:   17   Temp:  99.6 F (37.6 C) 98.1 F (36.7 C) 99.3 F (37.4 C)  TempSrc:  Oral  Oral  SpO2:  99% 99% 99%  Weight: 178 lb 3.2 oz (80.8 kg)  195 lb 12.3 oz (88.8 kg)   Height: 5\' 5"  (1.651 m)       Intake/Output Summary (Last 24 hours) at 12/07/16 0820 Last data filed at 12/07/16 0644  Gross per 24 hour  Intake              340 ml  Output                0 ml  Net              340 ml   Filed Weights   12/06/16 2042 12/07/16 0612  Weight: 178 lb 3.2 oz (80.8 kg) 195 lb 12.3 oz (88.8 kg)   Body mass index is 32.58 kg/m.  General:  Well nourished, well developed, in no acute distress HEENT: normal Lymph: no adenopathy Neck: no JVD Endocrine:  No thryomegaly Vascular: No carotid bruits; FA pulses 2+ bilaterally without bruits  Cardiac:  normal S1, S2; RRR; no murmur  Lungs: Diminished breath sound on Left side  Abd: soft, nontender, no hepatomegaly  Ext: no edema Musculoskeletal:  No deformities, BUE and BLE strength normal and equal Skin: warm and dry  Neuro:  CNs 2-12 intact, no focal abnormalities noted Psych:  Normal affect   Relevant CV Studies: Pending echo   Laboratory Data:  Chemistry Recent Labs Lab 12/06/16 1557 12/07/16 0203  NA 133* 136  K 3.7 3.3*  CL 98* 101  CO2 24 25  GLUCOSE 119* 108*  BUN 18 17  CREATININE 1.11 1.07  CALCIUM 9.1 9.1  GFRNONAA >60 >60  GFRAA >60 >60  ANIONGAP 11 10    Hematology Recent Labs Lab 12/06/16 1557 12/07/16 0203  WBC 10.3 7.6  RBC 3.89* 3.99*  HGB 11.0* 11.1*  HCT 33.8* 34.6*  MCV 86.9 86.7    MCH 28.3 27.8  MCHC 32.5 32.1  RDW 13.3 13.6  PLT 371 380   Cardiac Enzymes Recent Labs Lab 12/06/16 1557  TROPONINI <0.03   No results for input(s): TROPIPOC in the last 168 hours.  BNP Recent Labs Lab 12/06/16 1557  BNP 172.0*    DDimer No results for input(s): DDIMER in the last 168 hours.  Radiology/Studies:  Dg Chest 2 View  Result Date:  12/06/2016 CLINICAL DATA:  26 year old with current history of lupus, presenting with recurrent chest pain and shortness of breath which began acutely today. Patient was seen 2 weeks ago with a small left pleural effusion and a moderately large pericardial effusion. EXAM: CHEST  2 VIEW COMPARISON:  CTA chest 11/21/2016, 05/11/2015. Chest x-rays 11/21/2016, 02/03/2016 and earlier. FINDINGS: Cardiac silhouette markedly enlarged, unchanged from the examination 2 weeks ago. Interval marked increase in size of the now moderately large left pleural effusion. No evidence of right pleural effusion. Associated passive atelectasis in the left lower lobe. Lungs otherwise clear. Pulmonary vascularity normal. Visualized bony thorax intact. IMPRESSION: 1. Stable marked enlargement of the cardiac silhouette, shown on CTA chest 2 weeks ago to be due to a moderately large pericardial effusion. 2. Interval increase in size of the left pleural effusion since the examinations 2 weeks ago. 3. Associated passive atelectasis in the left lower lobe. Electronically Signed   By: Hulan Saas M.D.   On: 12/06/2016 14:27    Assessment and Plan:   1. Pericardial effusion - No sign of tamponade. No friction rub noted. Echo being done. Seem mild to no pericardial effusion. Pending final reading.   2. Cardiomyopathy - EF was 45-50% at [facility]. 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 No coronary calcification seen in CT of chest. Pending echo.    Lorelei Pont, PA  12/07/2016 8:20 AM   I have seen and examined the patient along with Bhagat,Bhavinkumar, PA.  I have reviewed  the chart, notes and new data.  I agree with PA/NP's note.  Key new complaints: Typical pleuritic pain Key examination changes: No evidence of jugular venous distention, pulsus paradoxus or pericardial rub;  clear lungs, no gallop. Key new findings / data: Preliminary review of echo at bedside shows no evidence of pericardial tamponade on. He has a trivial pericardial effusion. The inferior vena cava was not dilated. The posterior pericardium does appear unusually thickened, but there is no convincing Doppler evidence of constrictive physiology. Left ventricular regional wall motion is normal and ejection fraction is now 55-60%. ECG does not show any significant changes from previous tracings, with widespread but nonspecific ST-T-segment changes.  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  There is no evidence of ongoing myocardial dysfunction. No further cardiology recommendations at this time. Please reconsult over the weekend if necessary.  Thurmon Fair, MD, [facility] [phone] 12/07/2016, 11:03 AM

## 2016-12-08 DIAGNOSIS — R079 Chest pain, unspecified: Secondary | ICD-10-CM

## 2016-12-08 DIAGNOSIS — F411 Generalized anxiety disorder: Secondary | ICD-10-CM

## 2016-12-08 DIAGNOSIS — R071 Chest pain on breathing: Secondary | ICD-10-CM

## 2016-12-08 DIAGNOSIS — M329 Systemic lupus erythematosus, unspecified: Principal | ICD-10-CM

## 2016-12-08 MED ORDER — METHYLPREDNISOLONE SODIUM SUCC 125 MG IJ SOLR
125.0000 mg | Freq: Once | INTRAMUSCULAR | Status: AC
  Administered 2016-12-08: 125 mg via INTRAVENOUS
  Filled 2016-12-08: qty 2

## 2016-12-08 NOTE — Progress Notes (Signed)
PROGRESS NOTE    Dan Adams  KPT:[id number] DOB: [birth year] DOA: 12/06/2016 PCP: Tresa Garter, MD    Brief Narrative: Dan Adams is a 26 y.o. male with medical history significant for lupus and hypertension, presenting to the emergency department with chest pain and dyspnea. Patient had experienced the same symptoms approximately 2 weeks ago, was evaluated in the emergency department at that time, and underwent CTA chest that demonstrated a moderate pericardial effusion and a tiny left-sided pleural effusion. He followed up with his rheumatologist at [facility] regarding this, and she felt confident that the effusions are secondary to lupus. There were small at that time and did not require intervention. Symptoms resolved over the next couple days but returned since 2 days. He came to [facility], was found to have marked  enlargement of the cardiac silhouette and increased left pleural effusion on CXR, he was referred to [facility] for admission. Cardiology consulted to evaluate for pericardial effusion. IR consulted for left thoracentesis.   Assessment & Plan:   Principal Problem:   Chest pain Active Problems:   Lupus (systemic lupus erythematosus) (HCC)   Hypertension, secondary   Generalized anxiety disorder   Pleural effusion   Pericardial effusion   Normocytic anemia   Dyspnea at rest and on exertion:  From pericardial and pleural effusions from SLE.  Currently chest pain free.  No fever or chills. No pneumonia on CXR.  IR consulted for therapeutic and diagnostic thoracentesis.  Discussed with patient's rheumatologist, and started him on IV solumedrol for immunosuppression.    Pericardial effusion:  No tamponade physiology as per prelim report from echo.  Echocardiogram done , no further work up by cardiology.     Lupus:  Follow up at [facility].  Currently on prednisone. Plaquenil and cell cept.  Started on IV solumedrol for immunosuppression.    Hypertension;  Well  controlled.   Hypokalemia: replaced. Repeat in am.    Normocytic anemia/ anemia of chronic disease:  Hemoglobin stable around 11.       DVT prophylaxis: (Lovenox) Code Status: (Full/) Family Communication: none at bedside. ) Disposition Plan: (home in 1 to 2 days.   Consultants:   Cardiology  IR   Procedures:  Echocardiogram.    Antimicrobials:    Subjective: Breathing at better. Cough is better.  Objective: Vitals:   12/07/16 2012 12/08/16 0459 12/08/16 0500 12/08/16 1406  BP: 114/83 93/67  121/75  Pulse: (!) 116 94  (!) 103  Resp: 20 18  18   Temp: 99.6 F (37.6 C) 99.1 F (37.3 C)  98.7 F (37.1 C)  TempSrc:    Oral  SpO2: 100% 100%  95%  Weight:   87.2 kg (192 lb 4.8 oz)   Height:        Intake/Output Summary (Last 24 hours) at 12/08/16 1749 Last data filed at 12/08/16 1300  Gross per 24 hour  Intake             1200 ml  Output                0 ml  Net             1200 ml   Filed Weights   12/06/16 2042 12/07/16 0612 12/08/16 0500  Weight: 80.8 kg (178 lb 3.2 oz) 88.8 kg (195 lb 12.3 oz) 87.2 kg (192 lb 4.8 oz)    Examination:  General exam: Appears calm and comfortable not in any distress.  Respiratory system: right side diminished,no wheezing heard. 02/07/17  entry fair.  Cardiovascular system: S1 & S2 heard, RRR. No JVD, murmurs, rubs, gallops or clicks. No pedal edema. Gastrointestinal system: Abdomen is soft NT ND BS+ Central nervous system: Alert and oriented. No focal neurological deficits. Extremities: Symmetric 5 x 5 power.no pedal edema.  Skin: No rashes, lesions or ulcers Psychiatry: Judgement and insight appear normal. Mood & affect appropriate.     Data Reviewed: I have personally reviewed following labs and imaging studies  CBC:  Recent Labs Lab 12/06/16 1557 12/07/16 0203  WBC 10.3 7.6  HGB 11.0* 11.1*  HCT 33.8* 34.6*  MCV 86.9 86.7  PLT 371 380   Basic Metabolic Panel:  Recent Labs Lab 12/06/16 1557  12/07/16 0203  NA 133* 136  K 3.7 3.3*  CL 98* 101  CO2 24 25  GLUCOSE 119* 108*  BUN 18 17  CREATININE 1.11 1.07  CALCIUM 9.1 9.1   GFR: Estimated Creatinine Clearance: 105.3 mL/min (by C-G formula based on SCr of 1.07 mg/dL). Liver Function Tests: No results for input(s): AST, ALT, ALKPHOS, BILITOT, PROT, ALBUMIN in the last 168 hours. No results for input(s): LIPASE, AMYLASE in the last 168 hours. No results for input(s): AMMONIA in the last 168 hours. Coagulation Profile: No results for input(s): INR, PROTIME in the last 168 hours. Cardiac Enzymes:  Recent Labs Lab 12/06/16 1557  TROPONINI <0.03   BNP (last 3 results) No results for input(s): PROBNP in the last 8760 hours. HbA1C: No results for input(s): HGBA1C in the last 72 hours. CBG: No results for input(s): GLUCAP in the last 168 hours. Lipid Profile: No results for input(s): CHOL, HDL, LDLCALC, TRIG, CHOLHDL, LDLDIRECT in the last 72 hours. Thyroid Function Tests: No results for input(s): TSH, T4TOTAL, FREET4, T3FREE, THYROIDAB in the last 72 hours. Anemia Panel: No results for input(s): VITAMINB12, FOLATE, FERRITIN, TIBC, IRON, RETICCTPCT in the last 72 hours. Sepsis Labs: No results for input(s): PROCALCITON, LATICACIDVEN in the last 168 hours.  Recent Results (from the past 240 hour(s))  Culture, sputum-assessment     Status: None   Collection Time: 12/07/16  6:41 AM  Result Value Ref Range Status   Specimen Description SPUTUM  Final   Special Requests Immunocompromised  Final   Sputum evaluation   Final    Sputum specimen not acceptable for testing.  Please recollect.   Gram Stain Report Called to,Read Back By and Verified With: T.TOUR[place]ILLE RN [phone] [id number] HDAY    Report Status 12/07/2016 FINAL  Final  Gram stain     Status: None   Collection Time: 12/07/16 11:58 AM  Result Value Ref Range Status   Specimen Description PLEURAL LEFT  Final   Special Requests NONE  Final   Gram Stain   Final     RARE WBC PRESENT, PREDOMINANTLY MONONUCLEAR NO ORGANISMS SEEN    Report Status 12/07/2016 FINAL  Final  Culture, body fluid-bottle     Status: None (Preliminary result)   Collection Time: 12/07/16 11:58 AM  Result Value Ref Range Status   Specimen Description PLEURAL LEFT  Final   Special Requests NONE  Final   Culture NO GROWTH < 24 HOURS  Final   Report Status PENDING  Incomplete         Radiology Studies: Dg Chest 1 View  Result Date: 12/07/2016 CLINICAL DATA:  Follow-up left thoracentesis. EXAM: CHEST 1 VIEW COMPARISON:  12/06/2016 FINDINGS: Left pleural fluid has resolved following the thoracentesis. Negative for pneumothorax. Patchy densities at left lung base. The right lung  is clear. Cardiac silhouette remains enlarged. The trachea is midline. IMPRESSION: Negative for pneumothorax following the left thoracentesis. Improved aeration at the left lung base compatible with removal of the pleural fluid. Patchy densities at left lung base likely represent atelectasis. Stable enlargement of the cardiac silhouette. Patient has a known pericardial effusion. Electronically Signed   By: Richarda Overlie M.D.   On: 12/07/2016 12:08   Ir Thoracentesis Asp Pleural Space W/img Guide  Result Date: 12/07/2016 INDICATION: A with history of lupus, now with left pleural effusion. Request is made for diagnostic and therapeutic thoracentesis. EXAM: ULTRASOUND GUIDED DIAGNOSTIC AND THERAPEUTIC THORACENTESIS MEDICATIONS: 10 mL 1% lidocaine COMPLICATIONS: None immediate. PROCEDURE: An ultrasound guided thoracentesis was thoroughly discussed with the patient and questions answered. The benefits, risks, alternatives and complications were also discussed. The patient understands and wishes to proceed with the procedure. Written consent was obtained. Ultrasound was performed to localize and mark an adequate pocket of fluid in the left chest. The area was then prepped and draped in the normal sterile fashion. 1%  Lidocaine was used for local anesthesia. Under ultrasound guidance a Safe-T-Centesis catheter was introduced. Thoracentesis was performed. The catheter was removed and a dressing applied. FINDINGS: A total of approximately 200 mL of blood-tinged fluid was removed. Samples were sent to the laboratory as requested by the clinical team. IMPRESSION: Successful ultrasound guided diagnostic and therapeutic left thoracentesis yielding 200 mL of pleural fluid. Read by:  Loyce Dys PA-C Electronically Signed   By: Corlis Leak M.D.   On: 12/07/2016 13:02        Scheduled Meds: . aspirin EC  81 mg Oral Daily  . cholecalciferol  2,000 Units Oral Daily  . docusate sodium  100 mg Oral BID  . enoxaparin (LOVENOX) injection  40 mg Subcutaneous QHS  . hydroxychloroquine  400 mg Oral Daily  . mycophenolate  1,500 mg Oral BID  . NIFEdipine  30 mg Oral Daily  . sodium chloride flush  3 mL Intravenous Q12H   Continuous Infusions:   LOS: 1 day    Time spent: 35 minutes.     Kathlen Mody, MD [facility] Pager [phone]   If 7PM-7AM, please contact night-coverage [email or web address] Password [facility] 12/08/2016, 5:49 PM

## 2016-12-09 DIAGNOSIS — Z9889 Other specified postprocedural states: Secondary | ICD-10-CM

## 2016-12-09 LAB — BASIC METABOLIC PANEL
Anion gap: 10 (ref 5–15)
BUN: 18 mg/dL (ref 6–20)
CHLORIDE: 102 mmol/L (ref 101–111)
CO2: 23 mmol/L (ref 22–32)
CREATININE: 0.94 mg/dL (ref 0.61–1.24)
Calcium: 9.6 mg/dL (ref 8.9–10.3)
GFR calc Af Amer: >60 mL/min (ref >60)
Glucose, Bld: 132 mg/dL — ABNORMAL HIGH (ref 65–99)
Potassium: 4.8 mmol/L (ref 3.5–5.1)
SODIUM: 135 mmol/L (ref 135–145)

## 2016-12-09 LAB — CBC
HCT: 33.8 % — ABNORMAL LOW (ref 39.0–52.0)
Hemoglobin: 11.2 g/dL — ABNORMAL LOW (ref 13.0–17.0)
MCH: 28.2 pg (ref 26.0–34.0)
MCHC: 33.1 g/dL (ref 30.0–36.0)
MCV: 85.1 fL (ref 78.0–100.0)
PLATELETS: 384 10*3/uL (ref 150–400)
RBC: 3.97 MIL/uL — AB (ref 4.22–5.81)
RDW: 13.2 % (ref 11.5–15.5)
WBC: 4.7 10*3/uL (ref 4.0–10.5)

## 2016-12-09 MED ORDER — METHYLPREDNISOLONE SODIUM SUCC 125 MG IJ SOLR
60.0000 mg | Freq: Once | INTRAMUSCULAR | Status: AC
  Administered 2016-12-09: 60 mg via INTRAVENOUS
  Filled 2016-12-09: qty 2

## 2016-12-09 MED ORDER — HYDROXYCHLOROQUINE SULFATE 200 MG PO TABS: 400.0000 mg | ORAL_TABLET | Freq: Every day | ORAL | 0 refills | Status: AC

## 2016-12-09 MED ORDER — PREDNISONE 20 MG PO TABS: ORAL_TABLET | ORAL | 0 refills | Status: DC

## 2016-12-09 MED ORDER — DOCUSATE SODIUM 100 MG PO CAPS: 100.0000 mg | ORAL_CAPSULE | Freq: Two times a day (BID) | ORAL | 0 refills | Status: DC

## 2016-12-10 NOTE — Discharge Summary (Addendum)
Physician Discharge Summary  Dan Adams IWO:[id number] DOB: 1990/12/15 DOA: 12/06/2016  PCP: Tresa Garter, MD  Admit date: 12/06/2016 Discharge date: 12/09/2016  Admitted From: Home.  Disposition:  Home.   Recommendations for Outpatient Follow-up:  1. Follow up with PCP in 1-2 weeks 2. Please obtain BMP/CBC in one week Please follow up with your rheumatologist Dr Aundria Rud in one to 2 weeks.   Discharge Condition:stable.  CODE STATUS: full code Diet recommendation: Heart Healthy   Brief/Interim Summary: Dan Chenaultis a 26 y.o.malewith medical history significant for lupus and hypertension, presenting to the emergency department with chest pain and dyspnea. Patient had experienced the same symptoms approximately 2 weeks ago, was evaluated in the emergency department at that time, and underwent CTA chest that demonstrated a moderate pericardial effusion and a tiny left-sided pleural effusion. He followed up with his rheumatologist at [facility] regarding this, and she felt confident that the effusions are secondary to lupus. There were small at that time and did not require intervention. Symptoms resolved over the next couple days but returned since 2 days. He came to [facility], was found to have marked  enlargement of the cardiac silhouette and increased left pleural effusion on CXR, he was referred to [facility] for admission. Cardiology consulted to evaluate for pericardial effusion. IR consulted for left thoracentesis.   Discharge Diagnoses:  Principal Problem:   Chest pain Active Problems:   Lupus (systemic lupus erythematosus) (HCC)   Hypertension, secondary   Generalized anxiety disorder   Pleural effusion   Pericardial effusion   Normocytic anemia  Dyspnea at rest and on exertion:  From pericardial and pleural effusions from SLE.  Currently chest pain free.  No fever or chills. No pneumonia on CXR.  IR consulted for therapeutic and diagnostic thoracentesis. Pleural fluid  cultures were negative so far.  Discussed with patient's rheumatologist, and started him on IV solumedrol for immunosuppression, transitioned to prednisone taper on discharge.   Pericardial effusion:  No tamponade physiology as per prelim report from echo.  Echocardiogram done , no further work up by cardiology.     Lupus:  Follow up at [facility].  Currently on prednisone. Plaquenil and cell cept.  Started on IV solumedrol for immunosuppression, transitioned to prednisone taper.    Hypertension;  Well controlled.   Hypokalemia: replaced.    Normocytic anemia/ anemia of chronic disease:  Hemoglobin stable around 11.   Obesity:  Outpatient follow up, life style modifications, weight loss and risk factor modifications.   Discharge Instructions  Discharge Instructions    Diet - low sodium heart healthy    Complete by:  As directed    Discharge instructions    Complete by:  As directed    Follow up with Dr Aundria Rud in less than 2 weeks , before the steroid taper is completed.     Allergies as of 12/09/2016      Reactions   Promethazine Nausea And Vomiting      Medication List    STOP taking these medications   cyanocobalamin 1000 MCG/ML injection Commonly known as:  COBAL-1000     TAKE these medications   aspirin 81 MG EC tablet Take 1 tablet (81 mg total) by mouth daily.   docusate sodium 100 MG capsule Commonly known as:  COLACE Take 1 capsule (100 mg total) by mouth 2 (two) times daily. What changed:  when to take this   hydroxychloroquine 200 MG tablet Commonly known as:  PLAQUENIL Take 2 tablets (400 mg total) by mouth  daily. What changed:  how much to take   mycophenolate 500 MG tablet Commonly known as:  CELLCEPT Take 1,500 mg by mouth 2 (two) times daily.   NIFEdipine 30 MG 24 hr tablet Commonly known as:  PROCARDIA-XL/ADALAT-CC/NIFEDICAL-XL Take 30 mg by mouth daily.   predniSONE 20 MG tablet Commonly known as:  DELTASONE Prednisone  60 mg daily for 3 days followed by  Prednisone 40 mg daily for 3 days followed by  Prednisone 30 mg daily for 3 days followed by  Prednisone 20 mg daily for 3 days followed by  Prednisone 10 mg daily for 3 more days. What changed:  medication strength  how much to take  how to take this  when to take this  additional instructions   Vitamin D (Ergocalciferol) 50000 units Caps capsule Commonly known as:  DRISDOL Take 50,000 Units by mouth once a week.            Discharge Care Instructions        Start     Ordered   12/09/16 0000  docusate sodium (COLACE) 100 MG capsule  2 times daily     12/09/16 1308   12/09/16 0000  hydroxychloroquine (PLAQUENIL) 200 MG tablet  Daily     12/09/16 1308   12/09/16 0000  predniSONE (DELTASONE) 20 MG tablet     12/09/16 1308   12/09/16 0000  Diet - low sodium heart healthy     12/09/16 1308   12/09/16 0000  Discharge instructions    Comments:  Follow up with Dr Aundria Rud in less than 2 weeks , before the steroid taper is completed.   12/09/16 1308     Follow-up Information    Plotnikov, Georgina Quint, MD. Schedule an appointment as soon as possible for a visit in 1 week(s).   Specialty:  Internal Medicine Contact information: [address] [place] [address] [phone]          Allergies  Allergen Reactions  . Promethazine Nausea And Vomiting    Consultations:  Cardiology  IR     Procedures/Studies: Dg Chest 1 View  Result Date: 12/07/2016 CLINICAL DATA:  Follow-up left thoracentesis. EXAM: CHEST 1 VIEW COMPARISON:  12/06/2016 FINDINGS: Left pleural fluid has resolved following the thoracentesis. Negative for pneumothorax. Patchy densities at left lung base. The right lung is clear. Cardiac silhouette remains enlarged. The trachea is midline. IMPRESSION: Negative for pneumothorax following the left thoracentesis. Improved aeration at the left lung base compatible with removal of the pleural fluid. Patchy densities at  left lung base likely represent atelectasis. Stable enlargement of the cardiac silhouette. Patient has a known pericardial effusion. Electronically Signed   By: Richarda Overlie M.D.   On: 12/07/2016 12:08   Dg Chest 2 View  Result Date: 12/06/2016 CLINICAL DATA:  26 year old with current history of lupus, presenting with recurrent chest pain and shortness of breath which began acutely today. Patient was seen 2 weeks ago with a small left pleural effusion and a moderately large pericardial effusion. EXAM: CHEST  2 VIEW COMPARISON:  CTA chest 11/21/2016, 05/11/2015. Chest x-rays 11/21/2016, 02/03/2016 and earlier. FINDINGS: Cardiac silhouette markedly enlarged, unchanged from the examination 2 weeks ago. Interval marked increase in size of the now moderately large left pleural effusion. No evidence of right pleural effusion. Associated passive atelectasis in the left lower lobe. Lungs otherwise clear. Pulmonary vascularity normal. Visualized bony thorax intact. IMPRESSION: 1. Stable marked enlargement of the cardiac silhouette, shown on CTA chest 2 weeks ago  to be due to a moderately large pericardial effusion. 2. Interval increase in size of the left pleural effusion since the examinations 2 weeks ago. 3. Associated passive atelectasis in the left lower lobe. Electronically Signed   By: Hulan Saas M.D.   On: 12/06/2016 14:27   Dg Chest 2 View  Result Date: 11/21/2016 CLINICAL DATA:  Cough, chest pain and shortness of breath for 3 days. History of childhood asthma and systemic lupus erythematosus. EXAM: CHEST  2 VIEW COMPARISON:  Radiographs 02/03/2016.  CT 05/11/2015. FINDINGS: There are lower lung volumes. The heart size has increased. This may in part be secondary to the lower lung volumes, although progressive cardiomegaly or development of a pericardial effusion cannot be excluded. There is a small left pleural effusion with mild linear atelectasis at both lung bases. No edema, pneumothorax or osseous  abnormality is seen. IMPRESSION: 1. Interval enlargement of the cardiac silhouette may in part be secondary to lower lung volumes, although progressive cardiomegaly or pericardial effusion cannot be excluded. 2. Small left pleural effusion and mild bibasilar atelectasis. Electronically Signed   By: Carey Bullocks M.D.   On: 11/21/2016 10:24   Ct Angio Chest Pe W And/or Wo Contrast  Result Date: 11/21/2016 CLINICAL DATA:  Shortness of breath. EXAM: CT ANGIOGRAPHY CHEST WITH CONTRAST TECHNIQUE: Multidetector CT imaging of the chest was performed using the standard protocol during bolus administration of intravenous contrast. Multiplanar CT image reconstructions and MIPs were obtained to evaluate the vascular anatomy. CONTRAST:  100 cc Isovue 370 intravenous contrast. COMPARISON:  CT chest dated May 11, 2015. FINDINGS: Cardiovascular: Satisfactory opacification of the pulmonary arteries to the segmental level. No evidence of pulmonary embolism. Normal heart size. Moderate pericardial effusion. Normal thoracic aorta. Mediastinum/Nodes: Bilateral axillary lymphadenopathy, measuring up to 10 mm, similar to prior study. No enlarged mediastinal or hilar lymph nodes. The thyroid gland, trachea, and esophagus are unremarkable. Lungs/Pleura: Lingular atelectasis. Trace left pleural effusion. Left greater than right bibasilar atelectasis. No suspicious pulmonary nodule. No pneumothorax. Upper Abdomen: No acute abnormality. Musculoskeletal: No chest wall abnormality. No acute or significant osseous findings. Review of the MIP images confirms the above findings. IMPRESSION: 1. No evidence of pulmonary embolism. 2. New moderate pericardial effusion, which can be seen with lupus. 3. Unchanged bilateral axillary lymphadenopathy, nonspecific, and also possibly related to patient's lupus. 4. Trace left pleural effusion. Electronically Signed   By: Obie Dredge M.D.   On: 11/21/2016 11:43   Ir Thoracentesis Asp Pleural  Space W/img Guide  Result Date: 12/07/2016 INDICATION: A with history of lupus, now with left pleural effusion. Request is made for diagnostic and therapeutic thoracentesis. EXAM: ULTRASOUND GUIDED DIAGNOSTIC AND THERAPEUTIC THORACENTESIS MEDICATIONS: 10 mL 1% lidocaine COMPLICATIONS: None immediate. PROCEDURE: An ultrasound guided thoracentesis was thoroughly discussed with the patient and questions answered. The benefits, risks, alternatives and complications were also discussed. The patient understands and wishes to proceed with the procedure. Written consent was obtained. Ultrasound was performed to localize and mark an adequate pocket of fluid in the left chest. The area was then prepped and draped in the normal sterile fashion. 1% Lidocaine was used for local anesthesia. Under ultrasound guidance a Safe-T-Centesis catheter was introduced. Thoracentesis was performed. The catheter was removed and a dressing applied. FINDINGS: A total of approximately 200 mL of blood-tinged fluid was removed. Samples were sent to the laboratory as requested by the clinical team. IMPRESSION: Successful ultrasound guided diagnostic and therapeutic left thoracentesis yielding 200 mL of pleural fluid. Read by:  Loyce Dys PA-C Electronically Signed   By: Corlis Leak M.D.   On: 12/07/2016 13:02       Subjective: No chest pain or sob.   Discharge Exam: Vitals:   12/08/16 2057 12/09/16 0521  BP: 109/70 117/64  Pulse: 86 82  Resp: 15 18  Temp: 98.7 F (37.1 C) 97.6 F (36.4 C)  SpO2: 99% 100%   Vitals:   12/08/16 0500 12/08/16 1406 12/08/16 2057 12/09/16 0521  BP:  121/75 109/70 117/64  Pulse:  (!) 103 86 82  Resp:  18 15 18   Temp:  98.7 F (37.1 C) 98.7 F (37.1 C) 97.6 F (36.4 C)  TempSrc:  Oral    SpO2:  95% 99% 100%  Weight: 87.2 kg (192 lb 4.8 oz)   87.5 kg (193 lb)  Height:        General: Pt is alert, awake, not in acute distress Cardiovascular: RRR, S1/S2 +, no rubs, no  gallops Respiratory: CTA bilaterally, no wheezing, no rhonchi Abdominal: Soft, NT, ND, bowel sounds + Extremities: no edema, no cyanosis    The results of significant diagnostics from this hospitalization (including imaging, microbiology, ancillary and laboratory) are listed below for reference.     Microbiology: Recent Results (from the past 240 hour(s))  Culture, sputum-assessment     Status: None   Collection Time: 12/07/16  6:41 AM  Result Value Ref Range Status   Specimen Description SPUTUM  Final   Special Requests Immunocompromised  Final   Sputum evaluation   Final    Sputum specimen not acceptable for testing.  Please recollect.   Gram Stain Report Called to,Read Back By and Verified With: T.TOURVILLE RN [phone] [id number] HDAY    Report Status 12/07/2016 FINAL  Final  Gram stain     Status: None   Collection Time: 12/07/16 11:58 AM  Result Value Ref Range Status   Specimen Description PLEURAL LEFT  Final   Special Requests NONE  Final   Gram Stain   Final    RARE WBC PRESENT, PREDOMINANTLY MONONUCLEAR NO ORGANISMS SEEN    Report Status 12/07/2016 FINAL  Final  Culture, body fluid-bottle     Status: None (Preliminary result)   Collection Time: 12/07/16 11:58 AM  Result Value Ref Range Status   Specimen Description PLEURAL LEFT  Final   Special Requests NONE  Final   Culture NO GROWTH 2 DAYS  Final   Report Status PENDING  Incomplete     Labs: BNP (last 3 results)  Recent Labs  12/06/16 1557  BNP 172.0*   Basic Metabolic Panel:  Recent Labs Lab 12/06/16 1557 12/07/16 0203 12/09/16 0227  NA 133* 136 135  K 3.7 3.3* 4.8  CL 98* 101 102  CO2 24 25 23   GLUCOSE 119* 108* 132*  BUN 18 17 18   CREATININE 1.11 1.07 0.94  CALCIUM 9.1 9.1 9.6   Liver Function Tests: No results for input(s): AST, ALT, ALKPHOS, BILITOT, PROT, ALBUMIN in the last 168 hours. No results for input(s): LIPASE, AMYLASE in the last 168 hours. No results for input(s): AMMONIA in the  last 168 hours. CBC:  Recent Labs Lab 12/06/16 1557 12/07/16 0203 12/09/16 0227  WBC 10.3 7.6 4.7  HGB 11.0* 11.1* 11.2*  HCT 33.8* 34.6* 33.8*  MCV 86.9 86.7 85.1  PLT 371 380 384   Cardiac Enzymes:  Recent Labs Lab 12/06/16 1557  TROPONINI <0.03   BNP: Invalid input(s): POCBNP CBG: No results for input(s): GLUCAP in the last  168 hours. D-Dimer No results for input(s): DDIMER in the last 72 hours. Hgb A1c No results for input(s): HGBA1C in the last 72 hours. Lipid Profile No results for input(s): CHOL, HDL, LDLCALC, TRIG, CHOLHDL, LDLDIRECT in the last 72 hours. Thyroid function studies No results for input(s): TSH, T4TOTAL, T3FREE, THYROIDAB in the last 72 hours.  Invalid input(s): FREET3 Anemia work up No results for input(s): VITAMINB12, FOLATE, FERRITIN, TIBC, IRON, RETICCTPCT in the last 72 hours. Urinalysis    Component Value Date/Time   COLORURINE YELLOW 05/11/2015 0458   APPEARANCEUR CLEAR 05/11/2015 0458   LABSPEC 1.030 05/11/2015 0458   PHURINE 6.0 05/11/2015 0458   GLUCOSEU NEGATIVE 05/11/2015 0458   GLUCOSEU NEGATIVE 04/29/2015 0923   HGBUR MODERATE (A) 05/11/2015 0458   BILIRUBINUR NEGATIVE 05/11/2015 0458   KETONESUR NEGATIVE 05/11/2015 0458   PROTEINUR >300 (A) 05/11/2015 0458   UROBILINOGEN 0.2 04/29/2015 0923   NITRITE NEGATIVE 05/11/2015 0458   LEUKOCYTESUR NEGATIVE 05/11/2015 0458   Sepsis Labs Invalid input(s): PROCALCITONIN,  WBC,  LACTICIDVEN Microbiology Recent Results (from the past 240 hour(s))  Culture, sputum-assessment     Status: None   Collection Time: 12/07/16  6:41 AM  Result Value Ref Range Status   Specimen Description SPUTUM  Final   Special Requests Immunocompromised  Final   Sputum evaluation   Final    Sputum specimen not acceptable for testing.  Please recollect.   Gram Stain Report Called to,Read Back By and Verified With: T.TOURVILLE RN [phone] [id number] HDAY    Report Status 12/07/2016 FINAL  Final  Gram stain      Status: None   Collection Time: 12/07/16 11:58 AM  Result Value Ref Range Status   Specimen Description PLEURAL LEFT  Final   Special Requests NONE  Final   Gram Stain   Final    RARE WBC PRESENT, PREDOMINANTLY MONONUCLEAR NO ORGANISMS SEEN    Report Status 12/07/2016 FINAL  Final  Culture, body fluid-bottle     Status: None (Preliminary result)   Collection Time: 12/07/16 11:58 AM  Result Value Ref Range Status   Specimen Description PLEURAL LEFT  Final   Special Requests NONE  Final   Culture NO GROWTH 2 DAYS  Final   Report Status PENDING  Incomplete     Time coordinating discharge: Over 30 minutes  SIGNED:   Kathlen Mody, MD  [facility] 12/10/2016, 10:03 AM Pager   If 7PM-7AM, please contact night-coverage [email or web address] Password [email or web address]

## 2016-12-12 LAB — PATHOLOGIST SMEAR REVIEW

## 2016-12-12 LAB — CULTURE, BODY FLUID-BOTTLE

## 2016-12-12 LAB — CULTURE, BODY FLUID W GRAM STAIN -BOTTLE: Culture: NO GROWTH

## 2017-03-20 ENCOUNTER — Encounter (HOSPITAL_BASED_OUTPATIENT_CLINIC_OR_DEPARTMENT_OTHER): Payer: Self-pay | Admitting: Emergency Medicine

## 2017-03-20 ENCOUNTER — Emergency Department (HOSPITAL_BASED_OUTPATIENT_CLINIC_OR_DEPARTMENT_OTHER)
Admission: EM | Admit: 2017-03-20 | Discharge: 2017-03-20 | Disposition: A | Discharge status: Home / Self Care | Payer: Worker's Compensation | Attending: Emergency Medicine | Admitting: Emergency Medicine

## 2017-03-20 ENCOUNTER — Emergency Department (HOSPITAL_BASED_OUTPATIENT_CLINIC_OR_DEPARTMENT_OTHER): Payer: Worker's Compensation

## 2017-03-20 ENCOUNTER — Other Ambulatory Visit: Payer: Self-pay

## 2017-03-20 DIAGNOSIS — Z79899 Other long term (current) drug therapy: Secondary | ICD-10-CM | POA: Insufficient documentation

## 2017-03-20 DIAGNOSIS — M79661 Pain in right lower leg: Secondary | ICD-10-CM | POA: Insufficient documentation

## 2017-03-20 DIAGNOSIS — M79631 Pain in right forearm: Secondary | ICD-10-CM | POA: Insufficient documentation

## 2017-03-20 DIAGNOSIS — I10 Essential (primary) hypertension: Secondary | ICD-10-CM | POA: Diagnosis not present

## 2017-03-20 DIAGNOSIS — W19XXXA Unspecified fall, initial encounter: Secondary | ICD-10-CM

## 2017-03-20 DIAGNOSIS — F17290 Nicotine dependence, other tobacco product, uncomplicated: Secondary | ICD-10-CM | POA: Diagnosis not present

## 2017-03-20 DIAGNOSIS — Z7982 Long term (current) use of aspirin: Secondary | ICD-10-CM | POA: Diagnosis not present

## 2017-03-20 NOTE — ED Provider Notes (Signed)
[facility] EMERGENCY DEPARTMENT Provider Note   CSN: [id number] Arrival date & time: 03/20/17  0945     History   Chief Complaint Chief Complaint  Patient presents with  . Fall    HPI Dan Adams is a 26 y.o. male.  Dan Adams is a 26 y.o. male who presents to the ED complaining of right forearm and right lower leg pain after a slip and fall yesterday.  Patient reports he slipped and fell on ice yesterday landing on his right forearm and injuring his right lower leg.  He reports he has pain to his right mid forearm as well as to the lateral aspect of his right mid shin.  He denies hitting his head or loss of consciousness.  No treatments attempted prior to arrival today.  He has been ambulatory since the accident.  He denies fevers, head injury, loss of consciousness, neck pain, back pain, numbness, tingling or weakness.   The history is provided by the patient and medical records. No language interpreter was used.  Fall  Pertinent negatives include no headaches.    Past Medical History:  Diagnosis Date  . Anxiety   . Childhood asthma   . Chronic back pain   . Depression   . Hypertension   . Leukopenia 09/07/2013  . Migraine    "@ least 2-3 times/wk now" (02/10/2015)  . RA (rheumatoid arthritis) (HCC)    "feet, fingers, knees; it's from my lupus" (02/10/2015)  . Stroke-like symptoms 02/09/2015-02/10/2015   "right-left"  . Systemic lupus (HCC)   . Thrombocytopenia, unspecified (HCC) 09/07/2013    Patient Active Problem List   Diagnosis Date Noted  . Normocytic anemia   . Pleural effusion 12/06/2016  . Pericardial effusion 12/06/2016  . Chest pain 12/06/2016  . Fever, unspecified 04/30/2015  . Constipation 04/29/2015  . GERD (gastroesophageal reflux disease) 04/29/2015  . Generalized anxiety disorder 03/02/2015  . TIA (transient ischemic attack) 02/10/2015  . Stroke-like symptoms 02/10/2015  . Neurologic disorder   . Non compliance with medical  treatment 12/28/2014  . Mouth ulcers 12/28/2014  . Hypertension, secondary 10/13/2014  . Vitamin B12 deficiency 08/17/2014  . Vitamin D deficiency 08/17/2014  . Lupus (systemic lupus erythematosus) (HCC) 08/13/2014  . Leukopenia 09/07/2013  . Thrombocytopenia, unspecified (HCC) 09/07/2013    Past Surgical History:  Procedure Laterality Date  . IR THORACENTESIS ASP PLEURAL SPACE W/IMG GUIDE  12/07/2016  . TONSILLECTOMY AND ADENOIDECTOMY  2007       Home Medications    Prior to Admission medications   Medication Sig Start Date End Date Taking? Authorizing Provider  aspirin EC 81 MG EC tablet Take 1 tablet (81 mg total) by mouth daily. 02/12/15   Leroy Sea, MD  docusate sodium (COLACE) 100 MG capsule Take 1 capsule (100 mg total) by mouth 2 (two) times daily. 12/09/16   Kathlen Mody, MD  hydroxychloroquine (PLAQUENIL) 200 MG tablet Take 2 tablets (400 mg total) by mouth daily. 12/09/16   Kathlen Mody, MD  mycophenolate (CELLCEPT) 500 MG tablet Take 1,500 mg by mouth 2 (two) times daily.    [provider]  NIFEdipine (PROCARDIA-XL/ADALAT-CC/NIFEDICAL-XL) 30 MG 24 hr tablet Take 30 mg by mouth daily.    [provider]  predniSONE (DELTASONE) 20 MG tablet Prednisone 60 mg daily for 3 days followed by  Prednisone 40 mg daily for 3 days followed by  Prednisone 30 mg daily for 3 days followed by  Prednisone 20 mg daily for 3 days followed  by  Prednisone 10 mg daily for 3 more days. 12/09/16   Kathlen Mody, MD  Vitamin D, Ergocalciferol, (DRISDOL) 50000 units CAPS capsule Take 50,000 Units by mouth once a week. 09/24/16   [provider]    Family History Family History  Problem Relation Age of Onset  . Lupus Neg Hx     Social History Social History   Tobacco Use  . Smoking status: Current Every Day Smoker    Packs/day: 2.00    Years: 3.00    Pack years: 6.00    Types: Cigars, Cigarettes, E-cigarettes    Start date: 04/09/2010    Last attempt to  quit: 01/06/2014    Years since quitting: 3.2  . Smokeless tobacco: Never Used  . Tobacco comment: 02/10/2015 "quit smoking over 1 yr ago'  Substance Use Topics  . Alcohol use: No    Alcohol/week: 0.0 oz  . Drug use: No     Allergies   Promethazine   Review of Systems Review of Systems  Constitutional: Negative for fever.  Musculoskeletal: Positive for arthralgias. Negative for back pain, gait problem and neck pain.  Skin: Negative for color change, rash and wound.  Neurological: Negative for syncope, weakness, numbness and headaches.     Physical Exam Updated Vital Signs BP (!) 148/86 (BP Location: Right Arm)   Pulse 98   Temp 98.1 F (36.7 C) (Oral)   Resp 18   Ht 5\' 4"  (1.626 m)   Wt 90.7 kg (200 lb)   SpO2 100%   BMI 34.33 kg/m   Physical Exam  Constitutional: He appears well-developed and well-nourished. No distress.  Nontoxic appearing.  HENT:  Head: Normocephalic and atraumatic.  Eyes: Right eye exhibits no discharge. Left eye exhibits no discharge.  Cardiovascular: Normal rate, regular rhythm and intact distal pulses.  Bilateral radial, posterior tibialis and dorsalis pedis pulses are intact.    Pulmonary/Chest: Effort normal. No respiratory distress.  Musculoskeletal: Normal range of motion. He exhibits tenderness. He exhibits no edema or deformity.  Mild tenderness to his right mid forearm as well as the lateral aspect of his right mid lateral lower leg.  No real.  No ecchymosis or edema.  No deformity.  Good strength to plantar and dorsiflexion bilaterally.  No right elbow or wrist tenderness.  Good range of motion of his right elbow and wrist.  Neurological: He is alert. No sensory deficit. He exhibits normal muscle tone. Coordination normal.  Skin: Skin is warm and dry. Capillary refill takes less than 2 seconds. No rash noted. He is not diaphoretic. No erythema. No pallor.  Psychiatric: He has a normal mood and affect. His behavior is normal.  Nursing  note and vitals reviewed.    ED Treatments / Results  Labs (all labs ordered are listed, but only abnormal results are displayed) Labs Reviewed - No data to display  EKG  EKG Interpretation None       Radiology Dg Forearm Right  Result Date: 03/20/2017 CLINICAL DATA:  Elbow pain secondary to a fall on ice yesterday. EXAM: RIGHT FOREARM - 2 VIEW COMPARISON:  None. FINDINGS: There is no evidence of fracture or other focal bone lesions. Soft tissues are unremarkable. IMPRESSION: Negative. Electronically Signed   By: 14/03/2017 M.D.   On: 03/20/2017 11:22   Dg Tibia/fibula Right  Result Date: 03/20/2017 CLINICAL DATA:  Right lower leg pain secondary to a fall on ice yesterday. Patellar tendon rupture and surgery in February 2018. EXAM: RIGHT TIBIA AND  FIBULA - 2 VIEW COMPARISON:  Radiographs dated 05/28/2016 and 02/24/2007 FINDINGS: There is no evidence of fracture. Chronic thickening of the patellar tendon. Surgical changes in the proximal tibia. IMPRESSION: No acute abnormality. Electronically Signed   By: Francene Boyers M.D.   On: 03/20/2017 11:26    Procedures Procedures (including critical care time)  Medications Ordered in ED Medications - No data to display   Initial Impression / Assessment and Plan / ED Course  I have reviewed the triage vital signs and the nursing notes.  Pertinent labs & imaging results that were available during my care of the patient were reviewed by me and considered in my medical decision making (see chart for details).   This is a 26 y.o. male who presents to the ED complaining of right forearm and right lower leg pain after a slip and fall yesterday.  Patient reports he slipped and fell on ice yesterday landing on his right forearm and injuring his right lower leg.  He reports he has pain to his right mid forearm as well as to the lateral aspect of his right mid shin.  He denies hitting his head or loss of consciousness.  No treatments  attempted prior to arrival today.  He has been ambulatory since the accident. On exam the patient is afebrile nontoxic-appearing.  No overlying skin changes to his arm or leg.  He is neurovascular intact.  Ambulatory.  No deformity or ecchymosis.  X-rays are unremarkable.  I offered Tylenol to the patient.  He declines pain medication. I advised the patient to follow-up with their primary care provider this week. I advised the patient to return to the emergency department with new or worsening symptoms or new concerns. The patient verbalized understanding and agreement with plan.      Final Clinical Impressions(s) / ED Diagnoses   Final diagnoses:  Fall, initial encounter  Right forearm pain  Pain in right lower leg    ED Discharge Orders    None       Everlene Farrier, PA-C 03/20/17 1145    Benjiman Core, MD 03/20/17 1539

## 2017-03-20 NOTE — ED Notes (Signed)
Asked pt if employer required a W/C Drug screen pt called employer [employer] ) and reports they do not require it

## 2017-03-20 NOTE — Discharge Instructions (Signed)
X-rays show no fracture °

## 2017-03-20 NOTE — ED Triage Notes (Signed)
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  Reports injury to right arm and right leg yesterday.

## 2018-03-28 IMAGING — DX DG CHEST 2V
2 series · 2 of 2 positions shown · non-contrast
Comparison: CTA chest 11/21/2016, 05/11/2015. Chest x-rays
11/21/2016, 02/03/2016 and earlier.

CLINICAL DATA: 26-year-old with current history of lupus,
presenting with recurrent chest pain and shortness of breath which
began acutely today. Patient was seen 2 weeks ago with a small left
pleural effusion and a moderately large pericardial effusion.

EXAM:
CHEST  2 VIEW

[chest pa]
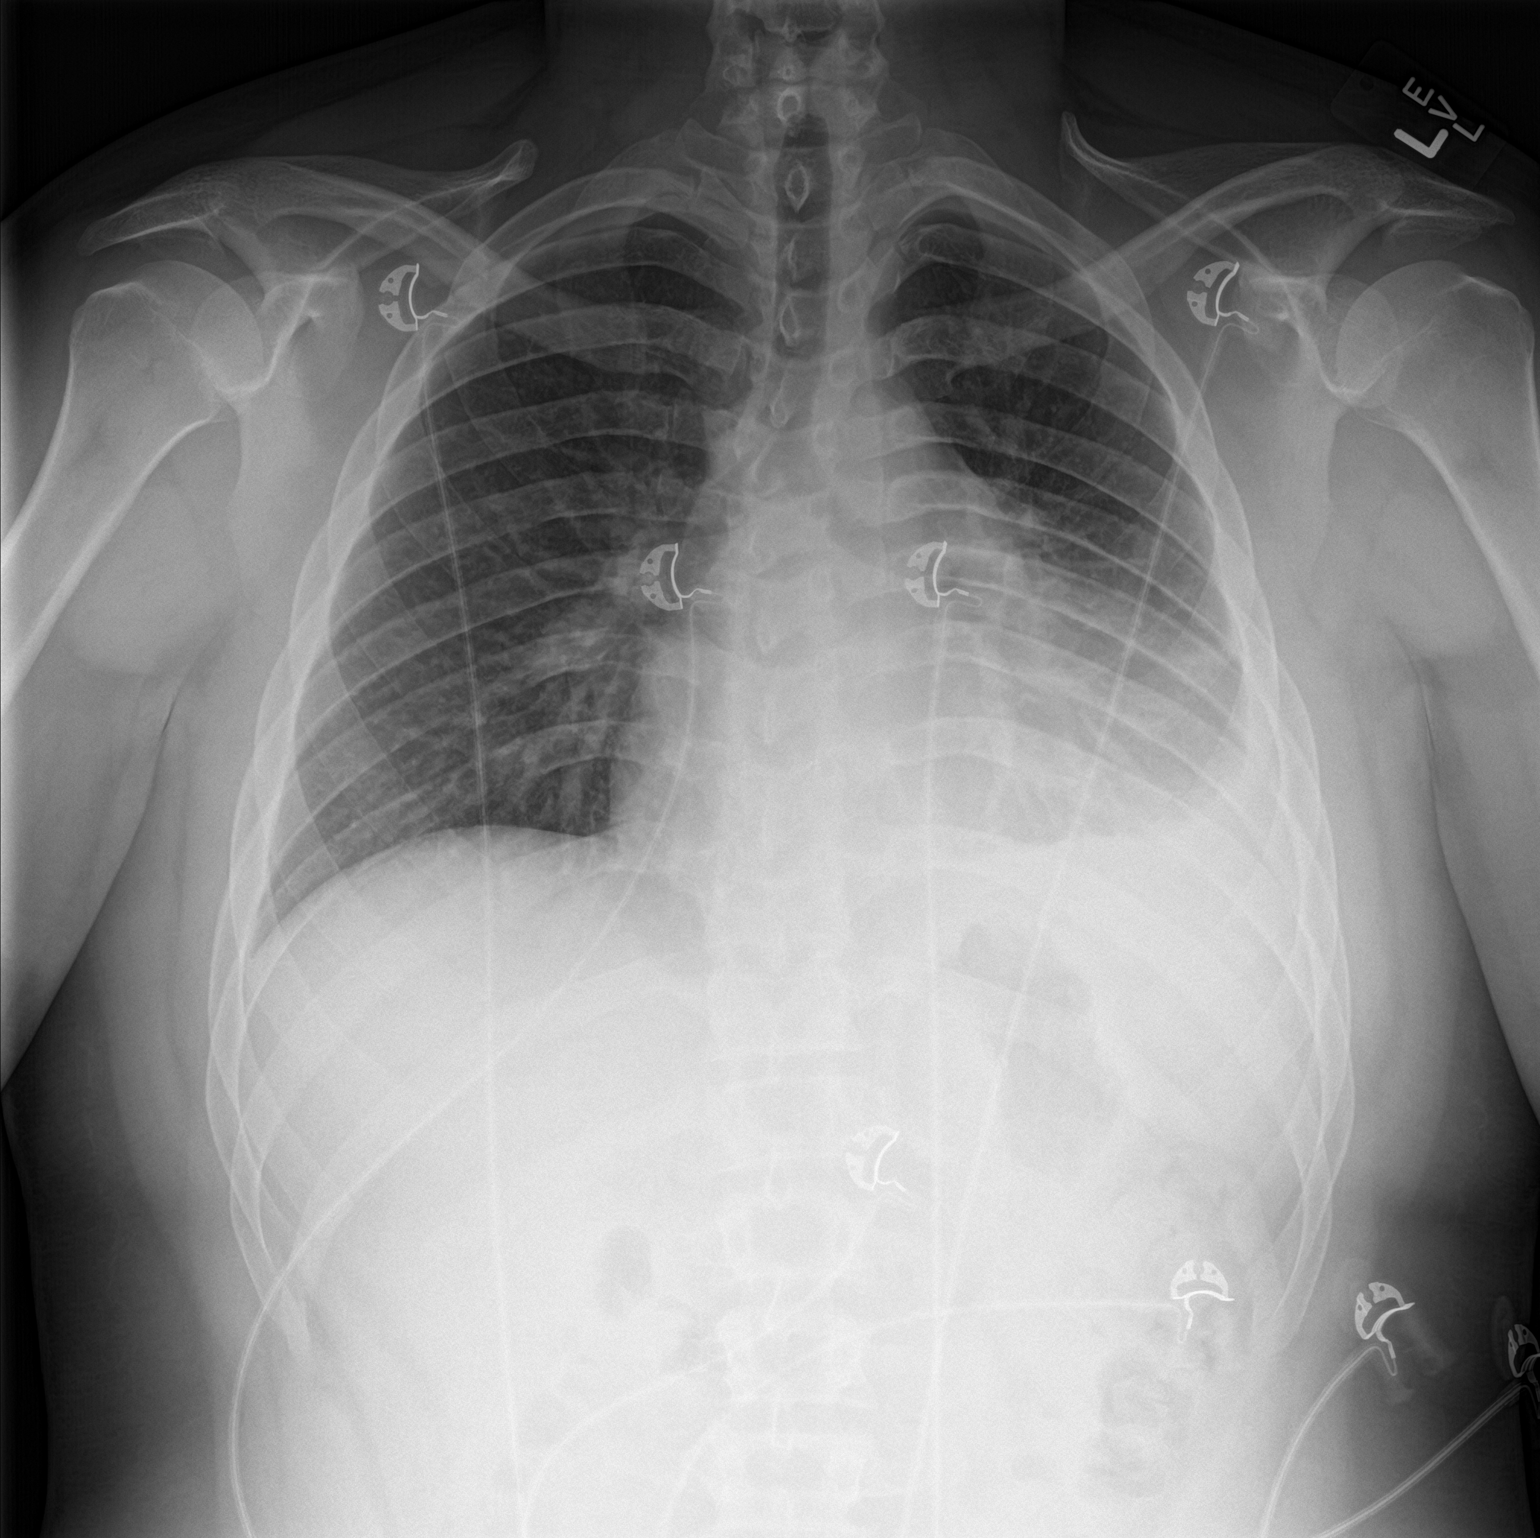

[chest lat]
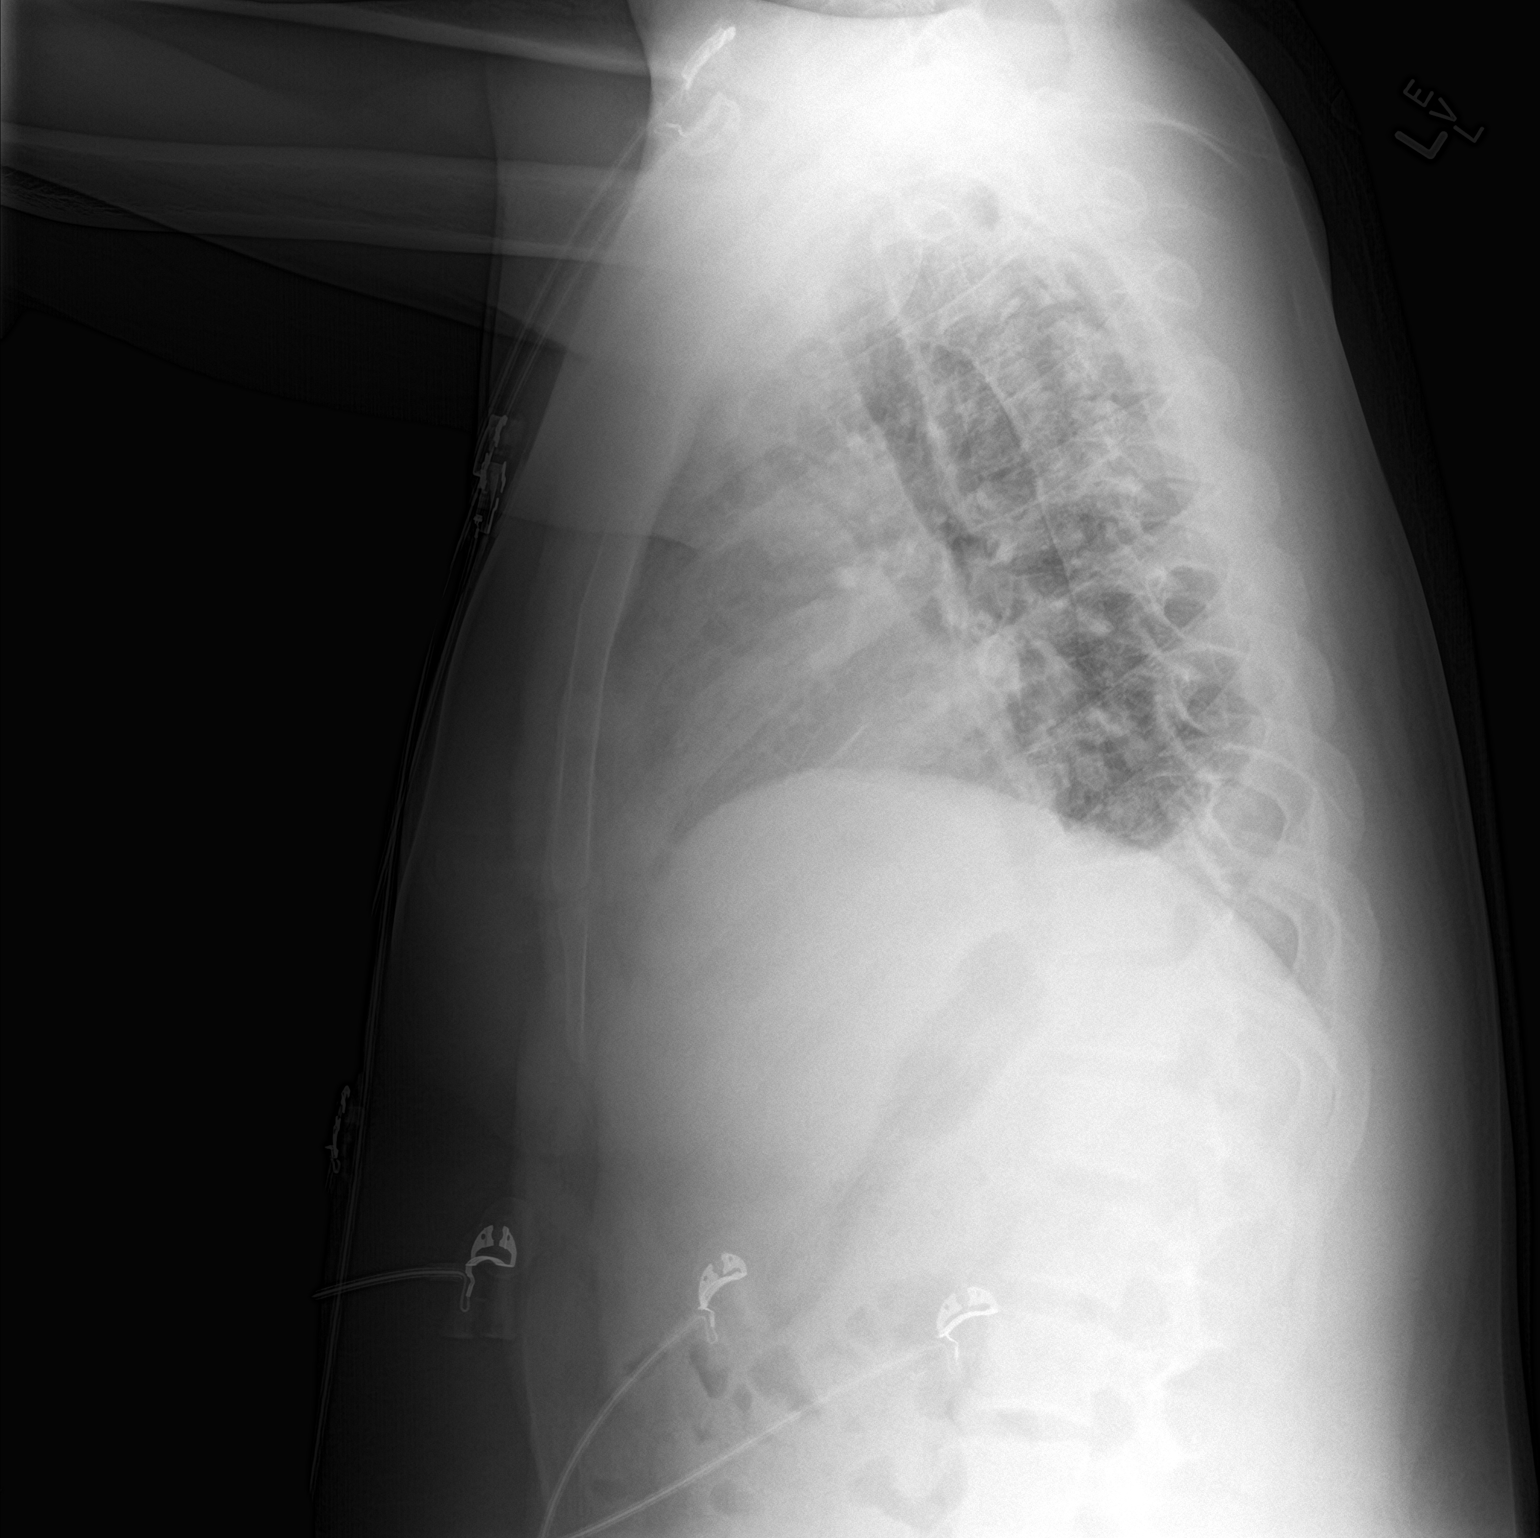

[2 of 2 positions shown; findings below may reference images not displayed]

FINDINGS: Cardiac silhouette markedly enlarged, unchanged from the examination
2 weeks ago. Interval marked increase in size of the now moderately
large left pleural effusion. No evidence of right pleural effusion.
Associated passive atelectasis in the left lower lobe. Lungs
otherwise clear. Pulmonary vascularity normal. Visualized bony
thorax intact.
IMPRESSION: 1. Stable marked enlargement of the cardiac silhouette, shown on CTA
chest 2 weeks ago to be due to a moderately large pericardial
effusion.
2. Interval increase in size of the left pleural effusion since the
examinations 2 weeks ago.
3. Associated passive atelectasis in the left lower lobe.

## 2018-03-29 IMAGING — US IR THORACENTESIS ASP PLEURAL SPACE W/IMG GUIDE
1 series · 1 of 1 positions shown · non-contrast
Comparison: none

INDICATION: A with history of lupus, now with left pleural effusion. Request is
made for diagnostic and therapeutic thoracentesis.

[Series 1: ir (id) (id)/(id)/(id) ir · 1 of 1 slices shown]
[im 1/1]
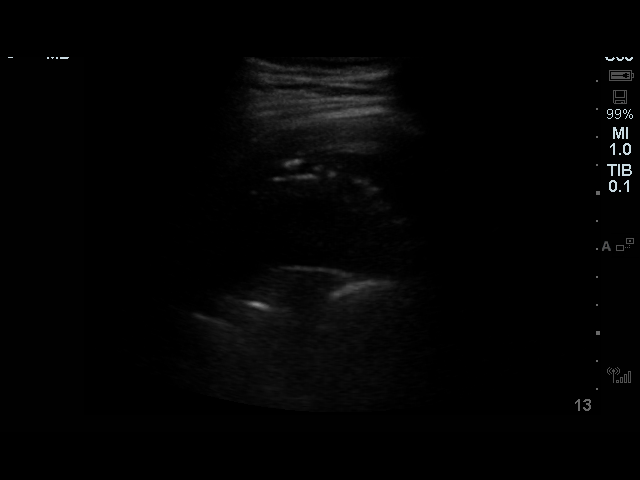

[1 of 1 positions shown; findings below may reference images not displayed]

EXAM:
ULTRASOUND GUIDED DIAGNOSTIC AND THERAPEUTIC THORACENTESIS

MEDICATIONS:
10 mL 1% lidocaine

COMPLICATIONS:
None immediate.

PROCEDURE:
An ultrasound guided thoracentesis was thoroughly discussed with the
patient and questions answered. The benefits, risks, alternatives
and complications were also discussed. The patient understands and
wishes to proceed with the procedure. Written consent was obtained.

Ultrasound was performed to localize and mark an adequate pocket of
fluid in the left chest. The area was then prepped and draped in the
normal sterile fashion. 1% Lidocaine was used for local anesthesia.
Under ultrasound guidance a Safe-T-Centesis catheter was introduced.
Thoracentesis was performed. The catheter was removed and a dressing
applied.
FINDINGS: A total of approximately 200 mL of blood-tinged fluid was removed.
Samples were sent to the laboratory as requested by the clinical
team.
IMPRESSION: Successful ultrasound guided diagnostic and therapeutic left
thoracentesis yielding 200 mL of pleural fluid.

## 2018-11-13 ENCOUNTER — Emergency Department (HOSPITAL_BASED_OUTPATIENT_CLINIC_OR_DEPARTMENT_OTHER): Payer: Self-pay

## 2018-11-13 ENCOUNTER — Emergency Department (HOSPITAL_BASED_OUTPATIENT_CLINIC_OR_DEPARTMENT_OTHER)
Admission: EM | Admit: 2018-11-13 | Discharge: 2018-11-13 | Disposition: A | Discharge status: Home / Self Care | Payer: Self-pay | Attending: Emergency Medicine | Admitting: Emergency Medicine

## 2018-11-13 ENCOUNTER — Encounter (HOSPITAL_BASED_OUTPATIENT_CLINIC_OR_DEPARTMENT_OTHER): Payer: Self-pay | Admitting: *Deleted

## 2018-11-13 ENCOUNTER — Other Ambulatory Visit: Payer: Self-pay

## 2018-11-13 DIAGNOSIS — F17210 Nicotine dependence, cigarettes, uncomplicated: Secondary | ICD-10-CM | POA: Insufficient documentation

## 2018-11-13 DIAGNOSIS — I10 Essential (primary) hypertension: Secondary | ICD-10-CM | POA: Insufficient documentation

## 2018-11-13 DIAGNOSIS — Z79899 Other long term (current) drug therapy: Secondary | ICD-10-CM | POA: Insufficient documentation

## 2018-11-13 DIAGNOSIS — R0602 Shortness of breath: Secondary | ICD-10-CM | POA: Insufficient documentation

## 2018-11-13 DIAGNOSIS — D696 Thrombocytopenia, unspecified: Secondary | ICD-10-CM | POA: Insufficient documentation

## 2018-11-13 DIAGNOSIS — M069 Rheumatoid arthritis, unspecified: Secondary | ICD-10-CM | POA: Insufficient documentation

## 2018-11-13 DIAGNOSIS — M3210 Systemic lupus erythematosus, organ or system involvement unspecified: Secondary | ICD-10-CM | POA: Insufficient documentation

## 2018-11-13 DIAGNOSIS — Z7982 Long term (current) use of aspirin: Secondary | ICD-10-CM | POA: Insufficient documentation

## 2018-11-13 LAB — CBC WITH DIFFERENTIAL/PLATELET
Abs Immature Granulocytes: 0.01 10*3/uL (ref 0.00–0.07)
Basophils Absolute: 0 10*3/uL (ref 0.0–0.1)
Basophils Relative: 0 %
Eosinophils Absolute: 0 10*3/uL (ref 0.0–0.5)
Eosinophils Relative: 0 %
HCT: 48 % (ref 39.0–52.0)
Hemoglobin: 16.3 g/dL (ref 13.0–17.0)
Immature Granulocytes: 0 %
Lymphocytes Relative: 27 %
Lymphs Abs: 0.9 10*3/uL (ref 0.7–4.0)
MCH: 29.5 pg (ref 26.0–34.0)
MCHC: 34 g/dL (ref 30.0–36.0)
MCV: 87 fL (ref 80.0–100.0)
Monocytes Absolute: 0.4 10*3/uL (ref 0.1–1.0)
Monocytes Relative: 13 %
Neutro Abs: 2 10*3/uL (ref 1.7–7.7)
Neutrophils Relative %: 60 %
Platelets: 161 10*3/uL (ref 150–400)
RBC: 5.52 MIL/uL (ref 4.22–5.81)
RDW: 12 % (ref 11.5–15.5)
WBC: 3.3 10*3/uL — ABNORMAL LOW (ref 4.0–10.5)
nRBC: 0 % (ref 0.0–0.2)

## 2018-11-13 LAB — BASIC METABOLIC PANEL
Anion gap: 13 (ref 5–15)
BUN: 15 mg/dL (ref 6–20)
CO2: 22 mmol/L (ref 22–32)
Calcium: 9.1 mg/dL (ref 8.9–10.3)
Chloride: 103 mmol/L (ref 98–111)
Creatinine, Ser: 1.28 mg/dL — ABNORMAL HIGH (ref 0.61–1.24)
GFR calc Af Amer: >60 mL/min (ref >60)
GFR calc non Af Amer: >60 mL/min (ref >60)
Glucose, Bld: 92 mg/dL (ref 70–99)
Potassium: 3.8 mmol/L (ref 3.5–5.1)
Sodium: 138 mmol/L (ref 135–145)

## 2018-11-13 LAB — BRAIN NATRIURETIC PEPTIDE: B Natriuretic Peptide: 10.9 pg/mL (ref 0.0–100.0)

## 2018-11-13 NOTE — ED Notes (Signed)
ED Provider at bedside. 

## 2018-11-13 NOTE — ED Triage Notes (Addendum)
Feels lightheaded. Feels like he has fluid around his heart x 2 weeks. Feels anxious.

## 2018-11-13 NOTE — ED Provider Notes (Signed)
[place] [facility] EMERGENCY DEPARTMENT Provider Note   CSN: [id number] Arrival date & time: 11/13/18  1848     History   Chief Complaint Chief Complaint  Patient presents with  . Shortness of Breath    HPI Dan Adams is a 28 y.o. male.     Patient with history of lupus currently on chronic prednisone, hydroxychloroquine, mycophenolate --presents to the emergency department with a sensation that he is having recurrent fluid in his lungs and around his heart.  He states he feels a general discomfort around his chest over the past 1 week.  He does not really feel shortness of breath.  He does report recent anxiety attacks.  No fevers or cough.  No lower extremity edema.  No reported orthopnea or recent dyspnea with exertion.  No nausea, vomiting, or diarrhea.  He is compliant with his medications.  He recently saw his rheumatologist at [facility] and patient states that they were happy with his lab work.  The onset of this condition was acute. The course is constant. Aggravating factors: none. Alleviating factors: none.       Past Medical History:  Diagnosis Date  . Anxiety   . Childhood asthma   . Chronic back pain   . Depression   . Hypertension   . Leukopenia 09/07/2013  . Migraine    "@ least 2-3 times/wk now" (02/10/2015)  . RA (rheumatoid arthritis) ([place])    "feet, fingers, knees; it's from my lupus" (02/10/2015)  . Stroke-like symptoms 02/09/2015-02/10/2015   "right-left"  . Systemic lupus ([place])   . Thrombocytopenia, unspecified ([place]) 09/07/2013    Patient Active Problem List   Diagnosis Date Noted  . Normocytic anemia   . Pleural effusion 12/06/2016  . Pericardial effusion 12/06/2016  . Chest pain 12/06/2016  . Fever, unspecified 04/30/2015  . Constipation 04/29/2015  . GERD (gastroesophageal reflux disease) 04/29/2015  . Generalized anxiety disorder 03/02/2015  . TIA (transient ischemic attack) 02/10/2015  . Stroke-like symptoms 02/10/2015  . Neurologic  disorder   . Non compliance with medical treatment 12/28/2014  . Mouth ulcers 12/28/2014  . Hypertension, secondary 10/13/2014  . Vitamin B12 deficiency 08/17/2014  . Vitamin D deficiency 08/17/2014  . Lupus (systemic lupus erythematosus) ([place]) 08/13/2014  . Leukopenia 09/07/2013  . Thrombocytopenia, unspecified ([place]) 09/07/2013    Past Surgical History:  Procedure Laterality Date  . IR THORACENTESIS ASP PLEURAL SPACE W/IMG GUIDE  12/07/2016  . TONSILLECTOMY AND ADENOIDECTOMY  2007        Home Medications    Prior to Admission medications   Medication Sig Start Date End Date Taking? Authorizing Provider  aspirin EC 81 MG EC tablet Take 1 tablet (81 mg total) by mouth daily. 02/12/15  Yes Thurnell Lose, MD  docusate sodium (COLACE) 100 MG capsule Take 1 capsule (100 mg total) by mouth 2 (two) times daily. 12/09/16  Yes Hosie Poisson, MD  hydroxychloroquine (PLAQUENIL) 200 MG tablet Take 2 tablets (400 mg total) by mouth daily. 12/09/16  Yes Hosie Poisson, MD  mycophenolate (CELLCEPT) 500 MG tablet Take 1,500 mg by mouth 2 (two) times daily.   Yes [provider]  NIFEdipine (PROCARDIA-XL/ADALAT-CC/NIFEDICAL-XL) 30 MG 24 hr tablet Take 30 mg by mouth daily.   Yes [provider]  predniSONE (DELTASONE) 20 MG tablet Prednisone 60 mg daily for 3 days followed by  Prednisone 40 mg daily for 3 days followed by  Prednisone 30 mg daily for 3 days followed by  Prednisone 20 mg daily for  3 days followed by  Prednisone 10 mg daily for 3 more days. 12/09/16  Yes Kathlen Mody, MD  Vitamin D, Ergocalciferol, (DRISDOL) 50000 units CAPS capsule Take 50,000 Units by mouth once a week. 09/24/16   [provider]    Family History Family History  Problem Relation Age of Onset  . Lupus Neg Hx     Social History Social History   Tobacco Use  . Smoking status: Current Every Day Smoker    Packs/day: 2.00    Years: 3.00    Pack years: 6.00    Types: Cigars,  Cigarettes, E-cigarettes    Start date: 04/09/2010    Last attempt to quit: 01/06/2014    Years since quitting: 4.8  . Smokeless tobacco: Never Used  . Tobacco comment: 02/10/2015 "quit smoking over 1 yr ago'  Substance Use Topics  . Alcohol use: No    Alcohol/week: 0.0 standard drinks  . Drug use: No     Allergies   Promethazine   Review of Systems Review of Systems  Constitutional: Negative for fever.  HENT: Negative for rhinorrhea and sore throat.   Eyes: Negative for redness.  Respiratory: Negative for cough and shortness of breath.   Cardiovascular: Positive for chest pain ('discomfort' not described as painful). Negative for leg swelling.  Gastrointestinal: Negative for abdominal pain, diarrhea, nausea and vomiting.  Genitourinary: Negative for dysuria.  Musculoskeletal: Negative for myalgias.  Skin: Negative for rash.  Neurological: Negative for headaches.  Psychiatric/Behavioral: The patient is nervous/anxious.      Physical Exam Updated Vital Signs BP 135/88   Pulse 84   Temp 98.6 F (37 C) (Oral)   Resp 20   Ht 5\' 5"  (1.651 m)   Wt 88.5 kg   SpO2 100%   BMI 32.45 kg/m   Physical Exam Vitals signs and nursing note reviewed.  Constitutional:      Appearance: He is well-developed. He is not diaphoretic.  HENT:     Head: Normocephalic and atraumatic.     Mouth/Throat:     Mouth: Mucous membranes are not dry.  Eyes:     Conjunctiva/sclera: Conjunctivae normal.  Neck:     Musculoskeletal: Normal range of motion and neck supple. No muscular tenderness.     Vascular: Normal carotid pulses. No carotid bruit or JVD.     Trachea: Trachea normal. No tracheal deviation.  Cardiovascular:     Rate and Rhythm: Normal rate and regular rhythm.     Pulses: No decreased pulses.     Heart sounds: Normal heart sounds, S1 normal and S2 normal. Heart sounds not distant. No murmur.  Pulmonary:     Effort: Pulmonary effort is normal. No respiratory distress.     Breath  sounds: Normal breath sounds. No decreased breath sounds or wheezing.  Chest:     Chest wall: No tenderness.  Abdominal:     General: Bowel sounds are normal.     Palpations: Abdomen is soft.     Tenderness: There is no abdominal tenderness. There is no guarding or rebound.  Skin:    General: Skin is warm and dry.     Coloration: Skin is not pale.  Neurological:     Mental Status: He is alert.      ED Treatments / Results  Labs (all labs ordered are listed, but only abnormal results are displayed) Labs Reviewed  CBC WITH DIFFERENTIAL/PLATELET - Abnormal; Notable for the following components:      Result Value   WBC  3.3 (*)    All other components within normal limits  BASIC METABOLIC PANEL - Abnormal; Notable for the following components:   Creatinine, Ser 1.28 (*)    All other components within normal limits  BRAIN NATRIURETIC PEPTIDE    EKG EKG Interpretation  Date/Time:  Thursday November 13 2018 19:33:32 EDT Ventricular Rate:  77 PR Interval:    QRS Duration: 102 QT Interval:  358 QTC Calculation: 406 R Axis:   39 Text Interpretation:  Sinus rhythm Probable left atrial enlargement Nonspecific T abnormalities, diffuse leads Since last tracing rate slower t wave inversions are old Confirmed by Jacalyn Lefevre [phone]) on 11/13/2018 7:36:00 PM   Radiology Dg Chest 2 View  Result Date: 11/13/2018 CLINICAL DATA:  Shortness of breath EXAM: CHEST - 2 VIEW COMPARISON:  12/07/2016 FINDINGS: The heart size and mediastinal contours are within normal limits. Both lungs are clear. The visualized skeletal structures are unremarkable. IMPRESSION: No active cardiopulmonary disease. Electronically Signed   By: Jasmine Pang M.D.   On: 11/13/2018 19:52    Procedures Procedures (including critical care time)  Medications Ordered in ED Medications - No data to display   Initial Impression / Assessment and Plan / ED Course  I have reviewed the triage vital signs and the nursing  notes.  Pertinent labs & imaging results that were available during my care of the patient were reviewed by me and considered in my medical decision making (see chart for details).        Patient seen and examined.  EKG personally reviewed.  Reviewed records from previous hospitalizations as well as recent visits at [facility].  Patient appears well and in no respiratory distress.  He is not tachycardic and has clear lung sounds.  Will check lab work, chest x-ray, ambulate.  Vital signs reviewed and are as follows: BP 135/88   Pulse 84   Temp 98.6 F (37 C) (Oral)   Resp 20   Ht 5\' 5"  (1.651 m)   Wt 88.5 kg   SpO2 100%   BMI 32.45 kg/m   I observed the patient ambulating in hallway.  Oxygen saturation in the low 90s however patient without any respiratory distress, labored breathing, tachypnea at this time.  Discussed patient and findings with Dr. .  Patient updated on results.  Upon reentering the room, patient is in no distress.  Oxygen saturation is 100% on the monitor.  We discussed his lab work-up, chest x-ray, EKG.  At this point, no indications for admission or signs of pleural effusion or pericardial effusions.  Patient asked if his symptoms may be due to acid reflux.  I encouraged him to take over-the-counter medications as desired to see if this helps.  Encouraged him to follow-up with his doctor in the next 2 to 3 days for reevaluation.  We discussed signs and symptoms which should cause him to return including worsening shortness of breath, increased work of breathing, chest pain, fever, new symptoms or other concerns.  Final Clinical Impressions(s) / ED Diagnoses   Final diagnoses:  SOB (shortness of breath)   Patient with history of lupus complicated by pleural effusion and pericardial effusion in the past.  Patient is compliant with medications.  Chest x-ray is clear and does not show any signs of pneumonia or pleural effusions tonight.  Normal BNP.  Patient does not  have any orthopnea, JVD, muffled heart sounds, lower extremity swelling on exam to suggest heart failure or pericardial effusion.  Lab work-up is reassuring.  EKG without any new changes.  Patient does not have any chest pain.  I do not have any high suspicion for recurrent complications from his lupus or other ischemic etiology of his symptoms.  Patient did ambulate in the hall with temporary desaturation.  I observe this personally and patient was in no respiratory distress during this time.  He has remained at 100% on room air and denies any worsening dyspnea on exertion recently.  Feel comfortable discharged home with close PCP follow-up, return with worsening. ED Discharge Orders    None       Desmond DikeGeiple, Keeley Sussman, PA-C 11/13/18 2119    Jacalyn LefevreHaviland, Julie, MD 11/13/18 2127

## 2018-11-13 NOTE — Progress Notes (Signed)
Patient attempted to ambulate around the department while on pulse ox.  Patient's SPO2 dropped to 89%.  Patient returned to his room and SPO2 returned to 100% while at rest.

## 2018-11-13 NOTE — Discharge Instructions (Signed)
Please read and follow all provided instructions.  Your diagnoses today include:  1. SOB (shortness of breath)     Tests performed today include:  EKG - no new changes  Chest x-ray -lungs are clear and heart looks normal size  Blood counts and electrolytes  Blood test for fluid build-up - was normal  Vital signs. See below for your results today.   Medications prescribed:   None  Take any prescribed medications only as directed.  Home care instructions:  Follow any educational materials contained in this packet.  BE VERY CAREFUL not to take multiple medicines containing Tylenol (also called acetaminophen). Doing so can lead to an overdose which can damage your liver and cause liver failure and possibly death.   Follow-up instructions: Please follow-up with your primary care provider in the next 3 days for further evaluation of your symptoms.   Return instructions:   Please return to the Emergency Department if you experience worsening symptoms.   Return with worsening shortness of breath or trouble breathing.  Please return if you have any other emergent concerns.  Additional Information:  Your vital signs today were: BP 112/70    Pulse 69    Temp 98.6 F (37 C) (Oral)    Resp (!) 21    Ht 5\' 5"  (1.651 m)    Wt 88.5 kg    SpO2 100%    BMI 32.45 kg/m  If your blood pressure (BP) was elevated above 135/85 this visit, please have this repeated by your doctor within one month. --------------

## 2018-11-26 ENCOUNTER — Emergency Department (HOSPITAL_BASED_OUTPATIENT_CLINIC_OR_DEPARTMENT_OTHER)
Admission: EM | Admit: 2018-11-26 | Discharge: 2018-11-26 | Disposition: A | Discharge status: Home / Self Care | Payer: Medicaid Other | Attending: Emergency Medicine | Admitting: Emergency Medicine

## 2018-11-26 ENCOUNTER — Other Ambulatory Visit: Payer: Self-pay

## 2018-11-26 ENCOUNTER — Encounter (HOSPITAL_BASED_OUTPATIENT_CLINIC_OR_DEPARTMENT_OTHER): Payer: Self-pay

## 2018-11-26 ENCOUNTER — Emergency Department (HOSPITAL_BASED_OUTPATIENT_CLINIC_OR_DEPARTMENT_OTHER): Payer: Medicaid Other

## 2018-11-26 DIAGNOSIS — R5383 Other fatigue: Secondary | ICD-10-CM | POA: Insufficient documentation

## 2018-11-26 DIAGNOSIS — Z8673 Personal history of transient ischemic attack (TIA), and cerebral infarction without residual deficits: Secondary | ICD-10-CM | POA: Insufficient documentation

## 2018-11-26 DIAGNOSIS — Z7982 Long term (current) use of aspirin: Secondary | ICD-10-CM | POA: Insufficient documentation

## 2018-11-26 DIAGNOSIS — Z87891 Personal history of nicotine dependence: Secondary | ICD-10-CM | POA: Insufficient documentation

## 2018-11-26 DIAGNOSIS — R0602 Shortness of breath: Secondary | ICD-10-CM | POA: Insufficient documentation

## 2018-11-26 DIAGNOSIS — I10 Essential (primary) hypertension: Secondary | ICD-10-CM | POA: Insufficient documentation

## 2018-11-26 DIAGNOSIS — Z79899 Other long term (current) drug therapy: Secondary | ICD-10-CM | POA: Insufficient documentation

## 2018-11-26 DIAGNOSIS — R51 Headache: Secondary | ICD-10-CM | POA: Insufficient documentation

## 2018-11-26 LAB — BASIC METABOLIC PANEL
Anion gap: 10 (ref 5–15)
BUN: 16 mg/dL (ref 6–20)
CO2: 20 mmol/L — ABNORMAL LOW (ref 22–32)
Calcium: 9.1 mg/dL (ref 8.9–10.3)
Chloride: 106 mmol/L (ref 98–111)
Creatinine, Ser: 1.14 mg/dL (ref 0.61–1.24)
GFR calc Af Amer: >60 mL/min (ref >60)
GFR calc non Af Amer: >60 mL/min (ref >60)
Glucose, Bld: 93 mg/dL (ref 70–99)
Potassium: 3.5 mmol/L (ref 3.5–5.1)
Sodium: 136 mmol/L (ref 135–145)

## 2018-11-26 LAB — CBC WITH DIFFERENTIAL/PLATELET
Abs Immature Granulocytes: 0.01 10*3/uL (ref 0.00–0.07)
Basophils Absolute: 0 10*3/uL (ref 0.0–0.1)
Basophils Relative: 0 %
Eosinophils Absolute: 0 10*3/uL (ref 0.0–0.5)
Eosinophils Relative: 0 %
HCT: 45.3 % (ref 39.0–52.0)
Hemoglobin: 15.5 g/dL (ref 13.0–17.0)
Immature Granulocytes: 0 %
Lymphocytes Relative: 33 %
Lymphs Abs: 1.1 10*3/uL (ref 0.7–4.0)
MCH: 29.6 pg (ref 26.0–34.0)
MCHC: 34.2 g/dL (ref 30.0–36.0)
MCV: 86.6 fL (ref 80.0–100.0)
Monocytes Absolute: 0.5 10*3/uL (ref 0.1–1.0)
Monocytes Relative: 15 %
Neutro Abs: 1.6 10*3/uL — ABNORMAL LOW (ref 1.7–7.7)
Neutrophils Relative %: 52 %
Platelets: 176 10*3/uL (ref 150–400)
RBC: 5.23 MIL/uL (ref 4.22–5.81)
RDW: 12.1 % (ref 11.5–15.5)
WBC: 3.2 10*3/uL — ABNORMAL LOW (ref 4.0–10.5)
nRBC: 0 % (ref 0.0–0.2)

## 2018-11-26 LAB — D-DIMER, QUANTITATIVE (NOT AT ARMC): D-Dimer, Quant: <0.27 ug/mL-FEU (ref 0.00–0.50)

## 2018-11-26 NOTE — ED Notes (Signed)
Pt placed on cardiac monitor 

## 2018-11-26 NOTE — ED Triage Notes (Signed)
Pt c/o SOB and a "shots feeling" in my back x 2+ weeks-states he was seen here for same-pt states his "lupus doctor" wanted him to come to ED to be "checked for a blood clot"-pt NAD-steady gait

## 2018-11-26 NOTE — Discharge Instructions (Signed)
You were seen in the ER for shortness of breath.  Lab work was normal.  D-dimer was normal.  Chest x-ray is stable compared to last one done on 8/6 without any enlargement around her heart, fluid around her lungs.  The cause of your shortness of breath is still unclear.  We discussed possibility that stress and anxiety may be contributing.  Further evaluation and discussion of your symptoms need to be done by your primary care provider or your rheumatologist.  Return to the ER for fever greater than 100, cough, chest pain or shortness of breath with exertion, leg swelling or calf pain that is persistent

## 2018-11-26 NOTE — ED Notes (Signed)
Pt ambulated around nursing department- pt SpO2 remained at 96% and HR of 88. Pt did not appear to be SOB.

## 2018-11-26 NOTE — ED Notes (Signed)
ED Provider at bedside. 

## 2018-11-26 NOTE — ED Provider Notes (Signed)
[facility] EMERGENCY DEPARTMENT Provider Note   CSN: [id number] Arrival date & time: 11/26/18  1859    History   Chief Complaint Chief Complaint  Patient presents with   Shortness of Breath    HPI Dan Adams is a 28 y.o. male with history of lupus on prednisone, hydrochloric Quinn, CellCept, pleural effusion and pericardial effusion s/p thoracentesis in 2018, hypertension, childhood asthma, anxiety presents to the ER for evaluation of shortness of breath that began 3 weeks ago.  Described as "harder to take deep breaths" but painless.  This comes and goes.  Sometimes if he gets up and walks around his chest "opens up" and his shortness of breath feels better.  The shortness of breath is more noticeable when he is at rest trying to sleep.  SOB is non exertional, non pleuritic. No orthopnea, PND. No LE edema, calf tenderness, cough. Has had associated generalized fatigue, tiredness, headaches, lightheadedness, tingling in his left foot this morning and "shock sensation" in his fingers that lasted a few seconds and go away.  Has had some "twinges" in his chest in the left, middle, right and posterior thoracic back that are fleeting lasting only a few seconds, nonexertional.  He noted some twinges and tingling in his calf but does not remember which one it was.  He was seen in the ER on 8/6 for shortness of breath.  He had labs, chest x-ray, BNP and everything was normal so he was discharged.  He has followed up with his rheumatologist at [facility] 1 week ago.  He called rheumatology again today for persistent shortness of breath and rheumatologist called in lab work was told everything was normal so he was instructed to come to the ER to get a d-dimer and possibly a CT of his chest to rule out a pulmonary embolism.  No exposure to COVID-19.  No recent travel.  Patient admits that lately he has had increased anxiety and stress.  He is on anxiety medicine and states over the last 2 days he  feels like his anxiety is improving.  His partner is pregnant and he feels stressed about the upcoming baby.  He wants to make sure that he is doing everything right and taking care of his health.  He wonders if this is all related to his anxiety and stress.  He denies any fever, exertional chest pain or shortness of breath, palpitations, leg swelling.  No known CAD.  No family history of CAD at a young age.  No history of DVT.  No congestion, rhinorrhea, sore throat, cough, nausea vomiting or abdominal pain or diarrhea.  No recent surgery, immobilization, hemoptysis, hormone therapy, active cancer.     HPI  Past Medical History:  Diagnosis Date   Anxiety    Childhood asthma    Chronic back pain    Depression    Hypertension    Leukopenia 09/07/2013   Migraine    "@ least 2-3 times/wk now" (02/10/2015)   RA (rheumatoid arthritis) (HCC)    "feet, fingers, knees; it's from my lupus" (02/10/2015)   Stroke-like symptoms 02/09/2015-02/10/2015   "right-left"   Systemic lupus (HCC)    Thrombocytopenia, unspecified (HCC) 09/07/2013    Patient Active Problem List   Diagnosis Date Noted   Normocytic anemia    Pleural effusion 12/06/2016   Pericardial effusion 12/06/2016   Chest pain 12/06/2016   Fever, unspecified 04/30/2015   Constipation 04/29/2015   GERD (gastroesophageal reflux disease) 04/29/2015   Generalized anxiety disorder 03/02/2015  TIA (transient ischemic attack) 02/10/2015   Stroke-like symptoms 02/10/2015   Neurologic disorder    Non compliance with medical treatment 12/28/2014   Mouth ulcers 12/28/2014   Hypertension, secondary 10/13/2014   Vitamin B12 deficiency 08/17/2014   Vitamin D deficiency 08/17/2014   Lupus (systemic lupus erythematosus) ([place]) 08/13/2014   Leukopenia 09/07/2013   Thrombocytopenia, unspecified ([place]) 09/07/2013    Past Surgical History:  Procedure Laterality Date   IR THORACENTESIS ASP PLEURAL SPACE W/IMG GUIDE   12/07/2016   TONSILLECTOMY AND ADENOIDECTOMY  2007        Home Medications    Prior to Admission medications   Medication Sig Start Date End Date Taking? Authorizing Provider  aspirin EC 81 MG EC tablet Take 1 tablet (81 mg total) by mouth daily. 02/12/15   Thurnell Lose, MD  docusate sodium (COLACE) 100 MG capsule Take 1 capsule (100 mg total) by mouth 2 (two) times daily. 12/09/16   Hosie Poisson, MD  hydroxychloroquine (PLAQUENIL) 200 MG tablet Take 2 tablets (400 mg total) by mouth daily. 12/09/16   Hosie Poisson, MD  mycophenolate (CELLCEPT) 500 MG tablet Take 1,500 mg by mouth 2 (two) times daily.    [provider]  NIFEdipine (PROCARDIA-XL/ADALAT-CC/NIFEDICAL-XL) 30 MG 24 hr tablet Take 30 mg by mouth daily.    [provider]  predniSONE (DELTASONE) 20 MG tablet Prednisone 60 mg daily for 3 days followed by  Prednisone 40 mg daily for 3 days followed by  Prednisone 30 mg daily for 3 days followed by  Prednisone 20 mg daily for 3 days followed by  Prednisone 10 mg daily for 3 more days. 12/09/16   Hosie Poisson, MD  Vitamin D, Ergocalciferol, (DRISDOL) 50000 units CAPS capsule Take 50,000 Units by mouth once a week. 09/24/16   [provider]    Family History Family History  Problem Relation Age of Onset   Lupus Neg Hx     Social History Social History   Tobacco Use   Smoking status: Former Smoker    Packs/day: 2.00    Years: 3.00    Pack years: 6.00    Start date: 04/09/2010    Quit date: 01/06/2014    Years since quitting: 4.8   Smokeless tobacco: Never Used  Substance Use Topics   Alcohol use: No    Alcohol/week: 0.0 standard drinks   Drug use: No     Allergies   Promethazine   Review of Systems Review of Systems  Constitutional: Positive for fatigue.  Respiratory: Positive for shortness of breath.   Neurological: Positive for headaches.       Tingling  All other systems reviewed and are negative.    Physical  Exam Updated Vital Signs BP 136/78    Pulse (!) 59    Temp 98.8 F (37.1 C) (Oral)    Resp 20    Ht 5\' 5"  (1.651 m)    Wt 88 kg    SpO2 100%    BMI 32.28 kg/m   Physical Exam Constitutional:      Appearance: He is well-developed.     Comments: NAD. Non toxic.   HENT:     Head: Normocephalic and atraumatic.     Nose: Nose normal.  Eyes:     General: Lids are normal.     Conjunctiva/sclera: Conjunctivae normal.  Neck:     Musculoskeletal: Normal range of motion.     Trachea: Trachea normal.     Comments: Trachea midline.  Cardiovascular:  Rate and Rhythm: Normal rate and regular rhythm.     Pulses:          Radial pulses are 1+ on the right side and 1+ on the left side.       Dorsalis pedis pulses are 1+ on the right side and 1+ on the left side.     Heart sounds: Normal heart sounds, S1 normal and S2 normal.     Comments: No LE edema or calf tenderness.  Pulmonary:     Effort: Pulmonary effort is normal.     Breath sounds: Normal breath sounds.     Comments: No chest wall tenderness Abdominal:     General: Bowel sounds are normal.     Palpations: Abdomen is soft.     Tenderness: There is no abdominal tenderness.     Comments: No epigastric tenderness. No distention.   Skin:    General: Skin is warm and dry.     Capillary Refill: Capillary refill takes less than 2 seconds.     Comments: No rash to chest wall  Neurological:     Mental Status: He is alert.     GCS: GCS eye subscore is 4. GCS verbal subscore is 5. GCS motor subscore is 6.  Psychiatric:        Speech: Speech normal.        Behavior: Behavior normal.        Thought Content: Thought content normal.      ED Treatments / Results  Labs (all labs ordered are listed, but only abnormal results are displayed) Labs Reviewed  CBC WITH DIFFERENTIAL/PLATELET - Abnormal; Notable for the following components:      Result Value   WBC 3.2 (*)    Neutro Abs 1.6 (*)    All other components within normal limits   BASIC METABOLIC PANEL - Abnormal; Notable for the following components:   CO2 20 (*)    All other components within normal limits  D-DIMER, QUANTITATIVE (NOT AT [facility])    EKG EKG Interpretation  Date/Time:  Wednesday November 26 2018 19:13:30 EDT Ventricular Rate:  81 PR Interval:  134 QRS Duration: 96 QT Interval:  362 QTC Calculation: 420 R Axis:   48 Text Interpretation:  Normal sinus rhythm T wave abnormality, consider inferior ischemia T wave abnormality, consider anterolateral ischemia Abnormal ECG similar to prior 8/20 Confirmed by Meridee Score [phone]) on 11/26/2018 7:26:32 PM   Radiology No results found.  Procedures Procedures (including critical care time)  Medications Ordered in ED Medications - No data to display   Initial Impression / Assessment and Plan / ED Course  I have reviewed the triage vital signs and the nursing notes.  Pertinent labs & imaging results that were available during my care of the patient were reviewed by me and considered in my medical decision making (see chart for details).  Clinical Course as of Nov 25 2309  Wed Nov 26, 2018  2016 Normal sinus rhythm T wave abnormality, consider inferior ischemia T wave abnormality, consider anterolateral ischemia Abnormal ECG similar to prior 8/20 Confirmed by Meridee Score [phone]) on 11/26/2018 7:26:32 PM  EKG 12-Lead [CG]  2132 WBC(!): 3.2 [CG]  2132 D-Dimer, Quant: <0.27 [CG]    Clinical Course User Index [CG] Liberty Handy, PA-C   Pt EMR reviewed to obtain pertinent PMH. Last ER visit 8/6 reviewed and cbc, bmp, bnp, cxr unremarkable. Rheumatologist recently ordered labs including lupus labs which she documented were normal and "at baseline".  She documents recommendation for ER work up for PE. Last echo 2018 with normal EF.    Pt is in no respiratory distress. HD stable. Afebrile. No hypoxemia. He ambulated in ER with SpO2 98% and normal HR, denied SOB/CP but felt dizzy.   No LE  edema, calf tenderness, signs of hypervolemia on exam. No murmurs. No muffled heart sounds, JVD. Normal lung sounds.   ER work up reviewed by me. No leukocytosis, anemia, electrolyte abnormalities.  Wells score is low, PERC negative. D-dimer negative. CXR without cardiomegaly, effusions, infiltrates, PTX. EKG unchanged.   Given chronicity of SOB x 3 weeks, benign work up highly unlikely this is PE.  He has no CP/SOB with exertion, and SOB is actually worse at rest. No family h/o CAD. ACS considered but unlikely. He has no clinical signs of pericardial effusion, HD instability, respiratory distress or signs of effusions on CXR. No infectious symptoms to suggest URI/LRI, COVID, PNA.  Last BNP normal 8/6, last echo with normal EF and no signs of hypervolemia on exam, edema on CXR making new onset HF highly unlikely in this patient.   Given benign exam, close f/u I don't think further emergent lab work, admission is indicated today.    Etiology of SOB unclear. He reports stress at home and wonders if this is related - could be.  Also pediatric asthma which could be contributing, although no cough, wheezing.   Discussed results with patient. Provided reassurance.  I don't think emergent CTA or CT chest is indicated today based on the above mentioned. He is comfortable with this. Encouraged close f/u with PCP/Rheum for further guidance on his ongoing dyspnea and consideration of further OP evaluation. Return precautions given.   Final Clinical Impressions(s) / ED Diagnoses   Final diagnoses:  SOB (shortness of breath)    ED Discharge Orders    None       Jerrell MylarGibbons, Teara Duerksen J, PA-C 11/26/18 2311    Terrilee FilesButler, Michael C, MD 11/27/18 1003

## 2019-01-29 ENCOUNTER — Emergency Department (HOSPITAL_BASED_OUTPATIENT_CLINIC_OR_DEPARTMENT_OTHER)
Admission: EM | Admit: 2019-01-29 | Discharge: 2019-01-29 | Disposition: A | Discharge status: Home / Self Care | Payer: Self-pay | Attending: Emergency Medicine | Admitting: Emergency Medicine

## 2019-01-29 ENCOUNTER — Other Ambulatory Visit: Payer: Self-pay

## 2019-01-29 ENCOUNTER — Emergency Department (HOSPITAL_BASED_OUTPATIENT_CLINIC_OR_DEPARTMENT_OTHER): Payer: Self-pay

## 2019-01-29 ENCOUNTER — Encounter (HOSPITAL_BASED_OUTPATIENT_CLINIC_OR_DEPARTMENT_OTHER): Payer: Self-pay | Admitting: Emergency Medicine

## 2019-01-29 DIAGNOSIS — Y939 Activity, unspecified: Secondary | ICD-10-CM | POA: Insufficient documentation

## 2019-01-29 DIAGNOSIS — Z7902 Long term (current) use of antithrombotics/antiplatelets: Secondary | ICD-10-CM | POA: Insufficient documentation

## 2019-01-29 DIAGNOSIS — Z87891 Personal history of nicotine dependence: Secondary | ICD-10-CM | POA: Insufficient documentation

## 2019-01-29 DIAGNOSIS — W2209XA Striking against other stationary object, initial encounter: Secondary | ICD-10-CM | POA: Insufficient documentation

## 2019-01-29 DIAGNOSIS — M3210 Systemic lupus erythematosus, organ or system involvement unspecified: Secondary | ICD-10-CM | POA: Insufficient documentation

## 2019-01-29 DIAGNOSIS — Z79899 Other long term (current) drug therapy: Secondary | ICD-10-CM | POA: Insufficient documentation

## 2019-01-29 DIAGNOSIS — S8002XA Contusion of left knee, initial encounter: Secondary | ICD-10-CM | POA: Insufficient documentation

## 2019-01-29 DIAGNOSIS — Y999 Unspecified external cause status: Secondary | ICD-10-CM | POA: Insufficient documentation

## 2019-01-29 DIAGNOSIS — Z888 Allergy status to other drugs, medicaments and biological substances status: Secondary | ICD-10-CM | POA: Insufficient documentation

## 2019-01-29 DIAGNOSIS — Z8673 Personal history of transient ischemic attack (TIA), and cerebral infarction without residual deficits: Secondary | ICD-10-CM | POA: Insufficient documentation

## 2019-01-29 DIAGNOSIS — I10 Essential (primary) hypertension: Secondary | ICD-10-CM | POA: Insufficient documentation

## 2019-01-29 DIAGNOSIS — Z7982 Long term (current) use of aspirin: Secondary | ICD-10-CM | POA: Insufficient documentation

## 2019-01-29 DIAGNOSIS — Y929 Unspecified place or not applicable: Secondary | ICD-10-CM | POA: Insufficient documentation

## 2019-01-29 NOTE — ED Triage Notes (Signed)
Patient arrived via POV c/o left knee pain x 2 days. Patient states he struck his left knee against a tote. Patient states he struck it a second time last night. Patient has hx of fx legs and lupus. Patient states left calf is warm and painful. Patient is AO x 4, gait normal, VS WDL.

## 2019-01-29 NOTE — ED Provider Notes (Signed)
[facility] EMERGENCY DEPARTMENT Provider Note   CSN: [id number] Arrival date & time: 01/29/19  8756     History   Chief Complaint Chief Complaint  Patient presents with  . Knee Pain    HPI Dan Adams is a 28 y.o. male.     28yo M w/ PMH including lupus, anxiety/depression, GERD who p/w L knee pain. 2 days ago, he struck the front of his left knee against an object. He has been ambulatory since then but his knee has felt tight and stiff, sometimes numb directly over the area that he hit. He reports distant h/o fractures in legs requiring surgical repair. Last night, he had a sudden, brief shooting pain in his right leg that resolved and has not returned. No swelling, redness, or warmth in either leg/calf. No recent travel, immobilization, or h/o blood clots. No therapies tried PTA.   The history is provided by the patient.  Knee Pain   Past Medical History:  Diagnosis Date  . Anxiety   . Childhood asthma   . Chronic back pain   . Depression   . Hypertension   . Leukopenia 09/07/2013  . Migraine    "@ least 2-3 times/wk now" (02/10/2015)  . RA (rheumatoid arthritis) (HCC)    "feet, fingers, knees; it's from my lupus" (02/10/2015)  . Stroke-like symptoms 02/09/2015-02/10/2015   "right-left"  . Systemic lupus (HCC)   . Thrombocytopenia, unspecified (HCC) 09/07/2013    Patient Active Problem List   Diagnosis Date Noted  . Normocytic anemia   . Pleural effusion 12/06/2016  . Pericardial effusion 12/06/2016  . Chest pain 12/06/2016  . Fever, unspecified 04/30/2015  . Constipation 04/29/2015  . GERD (gastroesophageal reflux disease) 04/29/2015  . Generalized anxiety disorder 03/02/2015  . TIA (transient ischemic attack) 02/10/2015  . Stroke-like symptoms 02/10/2015  . Neurologic disorder   . Non compliance with medical treatment 12/28/2014  . Mouth ulcers 12/28/2014  . Hypertension, secondary 10/13/2014  . Vitamin B12 deficiency 08/17/2014  . Vitamin D  deficiency 08/17/2014  . Lupus (systemic lupus erythematosus) (HCC) 08/13/2014  . Leukopenia 09/07/2013  . Thrombocytopenia, unspecified (HCC) 09/07/2013    Past Surgical History:  Procedure Laterality Date  . IR THORACENTESIS ASP PLEURAL SPACE W/IMG GUIDE  12/07/2016  . TONSILLECTOMY AND ADENOIDECTOMY  2007        Home Medications    Prior to Admission medications   Medication Sig Start Date End Date Taking? Authorizing Provider  aspirin EC 81 MG EC tablet Take 1 tablet (81 mg total) by mouth daily. 02/12/15   Leroy Sea, MD  docusate sodium (COLACE) 100 MG capsule Take 1 capsule (100 mg total) by mouth 2 (two) times daily. 12/09/16   Kathlen Mody, MD  hydroxychloroquine (PLAQUENIL) 200 MG tablet Take 2 tablets (400 mg total) by mouth daily. 12/09/16   Kathlen Mody, MD  mycophenolate (CELLCEPT) 500 MG tablet Take 1,500 mg by mouth 2 (two) times daily.    [provider]  NIFEdipine (PROCARDIA-XL/ADALAT-CC/NIFEDICAL-XL) 30 MG 24 hr tablet Take 30 mg by mouth daily.    [provider]  predniSONE (DELTASONE) 20 MG tablet Prednisone 60 mg daily for 3 days followed by  Prednisone 40 mg daily for 3 days followed by  Prednisone 30 mg daily for 3 days followed by  Prednisone 20 mg daily for 3 days followed by  Prednisone 10 mg daily for 3 more days. 12/09/16   Kathlen Mody, MD  Vitamin D, Ergocalciferol, (DRISDOL) 50000 units CAPS  capsule Take 50,000 Units by mouth once a week. 09/24/16   [provider]    Family History Family History  Problem Relation Age of Onset  . Lupus Neg Hx     Social History Social History   Tobacco Use  . Smoking status: Former Smoker    Packs/day: 2.00    Years: 3.00    Pack years: 6.00    Start date: 04/09/2010    Quit date: 01/06/2014    Years since quitting: 5.0  . Smokeless tobacco: Never Used  Substance Use Topics  . Alcohol use: No    Alcohol/week: 0.0 standard drinks  . Drug use: No     Allergies    Promethazine   Review of Systems Review of Systems  Musculoskeletal: Negative for gait problem and joint swelling.  Skin: Negative for color change and wound.  Neurological: Positive for numbness.     Physical Exam Updated Vital Signs BP 126/87 (BP Location: Right Arm)   Pulse 70   Temp 98.7 F (37.1 C) (Oral)   Resp 16   Ht 5\' 5"  (1.651 m)   Wt 88 kg   SpO2 100%   BMI 32.28 kg/m   Physical Exam Vitals signs and nursing note reviewed.  Constitutional:      General: He is not in acute distress.    Appearance: He is well-developed.  HENT:     Head: Normocephalic and atraumatic.  Eyes:     Conjunctiva/sclera: Conjunctivae normal.  Neck:     Musculoskeletal: Neck supple.  Musculoskeletal: Normal range of motion.        General: No swelling, tenderness or deformity.     Comments: Full ROM L knee without obvious effusion or area of tenderness, no joint laxity, neg anterior/posterior drawer test; b/l surgical scars over knees; no calf tenderness or LE edema b/l  Skin:    General: Skin is warm and dry.  Neurological:     Mental Status: He is alert and oriented to person, place, and time.  Psychiatric:        Judgment: Judgment normal.      ED Treatments / Results  Labs (all labs ordered are listed, but only abnormal results are displayed) Labs Reviewed - No data to display  EKG None  Radiology Dg Knee Complete 4 Views Left  Result Date: 01/29/2019 CLINICAL DATA:  Left knee pain. EXAM: LEFT KNEE - COMPLETE 4+ VIEW COMPARISON:  May 28, 2016. FINDINGS: No evidence of fracture, dislocation, or joint effusion. No evidence of arthropathy. Postsurgical changes are seen in the anterior portion of the proximal tibia. Soft tissues are unremarkable. IMPRESSION: Postsurgical changes as described above. No acute abnormality seen in the left knee. Electronically Signed   By: May 30, 2016 M.D.   On: 01/29/2019 07:46    Procedures Procedures (including critical care  time)  Medications Ordered in ED Medications - No data to display   Initial Impression / Assessment and Plan / ED Course  I have reviewed the triage vital signs and the nursing notes.  Pertinent imaging results that were available during my care of the patient were reviewed by me and considered in my medical decision making (see chart for details).       History and physical exam reassuring against blood clot. Main compliant is knee stiffness after trauma.Obtained XR which was negative acute. Discussed supportive measures.  Final Clinical Impressions(s) / ED Diagnoses   Final diagnoses:  Contusion of left knee, initial encounter  ED Discharge Orders    None       Little, Wenda Overland, MD 01/29/19 [phone]

## 2019-04-08 ENCOUNTER — Ambulatory Visit: Payer: Medicaid Other | Attending: Internal Medicine

## 2019-04-08 DIAGNOSIS — Z20822 Contact with and (suspected) exposure to covid-19: Secondary | ICD-10-CM

## 2019-04-09 LAB — NOVEL CORONAVIRUS, NAA: SARS-CoV-2, NAA: NOT DETECTED

## 2021-02-11 ENCOUNTER — Emergency Department (HOSPITAL_BASED_OUTPATIENT_CLINIC_OR_DEPARTMENT_OTHER): Payer: Medicaid Other

## 2021-02-11 ENCOUNTER — Emergency Department (HOSPITAL_BASED_OUTPATIENT_CLINIC_OR_DEPARTMENT_OTHER)
Admission: EM | Admit: 2021-02-11 | Discharge: 2021-02-11 | Disposition: A | Discharge status: Home / Self Care | Payer: Medicaid Other | Attending: Emergency Medicine | Admitting: Emergency Medicine

## 2021-02-11 ENCOUNTER — Other Ambulatory Visit: Payer: Self-pay

## 2021-02-11 ENCOUNTER — Encounter (HOSPITAL_BASED_OUTPATIENT_CLINIC_OR_DEPARTMENT_OTHER): Payer: Self-pay | Admitting: Emergency Medicine

## 2021-02-11 DIAGNOSIS — Z8673 Personal history of transient ischemic attack (TIA), and cerebral infarction without residual deficits: Secondary | ICD-10-CM | POA: Insufficient documentation

## 2021-02-11 DIAGNOSIS — J45909 Unspecified asthma, uncomplicated: Secondary | ICD-10-CM | POA: Insufficient documentation

## 2021-02-11 DIAGNOSIS — I10 Essential (primary) hypertension: Secondary | ICD-10-CM | POA: Insufficient documentation

## 2021-02-11 DIAGNOSIS — Z7982 Long term (current) use of aspirin: Secondary | ICD-10-CM | POA: Insufficient documentation

## 2021-02-11 DIAGNOSIS — R0781 Pleurodynia: Secondary | ICD-10-CM | POA: Insufficient documentation

## 2021-02-11 DIAGNOSIS — R079 Chest pain, unspecified: Secondary | ICD-10-CM

## 2021-02-11 DIAGNOSIS — Z79899 Other long term (current) drug therapy: Secondary | ICD-10-CM | POA: Insufficient documentation

## 2021-02-11 DIAGNOSIS — Z20822 Contact with and (suspected) exposure to covid-19: Secondary | ICD-10-CM | POA: Insufficient documentation

## 2021-02-11 DIAGNOSIS — Z87891 Personal history of nicotine dependence: Secondary | ICD-10-CM | POA: Insufficient documentation

## 2021-02-11 LAB — BASIC METABOLIC PANEL
Anion gap: 9 (ref 5–15)
BUN: 14 mg/dL (ref 6–20)
CO2: 24 mmol/L (ref 22–32)
Calcium: 8.5 mg/dL — ABNORMAL LOW (ref 8.9–10.3)
Chloride: 103 mmol/L (ref 98–111)
Creatinine, Ser: 1.18 mg/dL (ref 0.61–1.24)
GFR, Estimated: >60 mL/min (ref >60)
Glucose, Bld: 101 mg/dL — ABNORMAL HIGH (ref 70–99)
Potassium: 3.4 mmol/L — ABNORMAL LOW (ref 3.5–5.1)
Sodium: 136 mmol/L (ref 135–145)

## 2021-02-11 LAB — D-DIMER, QUANTITATIVE: D-Dimer, Quant: 12.58 ug/mL-FEU — ABNORMAL HIGH (ref 0.00–0.50)

## 2021-02-11 LAB — CBC
HCT: 38.2 % — ABNORMAL LOW (ref 39.0–52.0)
Hemoglobin: 13.2 g/dL (ref 13.0–17.0)
MCH: 30 pg (ref 26.0–34.0)
MCHC: 34.6 g/dL (ref 30.0–36.0)
MCV: 86.8 fL (ref 80.0–100.0)
Platelets: 257 10*3/uL (ref 150–400)
RBC: 4.4 MIL/uL (ref 4.22–5.81)
RDW: 11.7 % (ref 11.5–15.5)
WBC: 5 10*3/uL (ref 4.0–10.5)
nRBC: 0 % (ref 0.0–0.2)

## 2021-02-11 LAB — RESP PANEL BY RT-PCR (FLU A&B, COVID) ARPGX2
Influenza A by PCR: NEGATIVE
Influenza B by PCR: NEGATIVE
SARS Coronavirus 2 by RT PCR: NEGATIVE

## 2021-02-11 LAB — TROPONIN I (HIGH SENSITIVITY)
Troponin I (High Sensitivity): 4 ng/L (ref <18)
Troponin I (High Sensitivity): 4 ng/L (ref <18)

## 2021-02-11 MED ORDER — IOHEXOL 350 MG/ML SOLN
100.0000 mL | Freq: Once | INTRAVENOUS | Status: AC | PRN
  Administered 2021-02-11: 100 mL via INTRAVENOUS

## 2021-02-11 MED ORDER — KETOROLAC TROMETHAMINE 15 MG/ML IJ SOLN
15.0000 mg | Freq: Once | INTRAMUSCULAR | Status: AC
  Administered 2021-02-11: 15 mg via INTRAVENOUS
  Filled 2021-02-11: qty 1

## 2021-02-11 NOTE — ED Notes (Signed)
Patient transported to CT 

## 2021-02-11 NOTE — ED Provider Notes (Signed)
[facility] DEPART[facility] Provider Note   CSN: [id number] Arrival date & time: 02/11/21  0820     History Chief Complaint  Patient presents with  . Chest Pain    Dan Adams is a 30 y.o. male.  Presents to ER with concern for chest pain.  Patient reports that pain has been ongoing for the last 3 days.  Comes and goes, seems to be worse when he takes a deep breath.  No shortness of breath at present.  Aching, right-sided.  Patient states that pain did seem to get worse after using slicer at work.  Works at [employer].  No cough or fevers.  Otherwise feels fine.  Patient has history of rheumatoid arthritis, lupus, on CellCept and Plaquenil.  HPI     Past Medical History:  Diagnosis Date  . Anxiety   . Childhood asthma   . Chronic back pain   . Depression   . Hypertension   . Leukopenia 09/07/2013  . Migraine    "@ least 2-3 times/wk now" (02/10/2015)  . RA (rheumatoid arthritis) ([place])    "feet, fingers, knees; it's from my lupus" (02/10/2015)  . Stroke-like symptoms 02/09/2015-02/10/2015   "right-left"  . Systemic lupus ([place])   . Thrombocytopenia, unspecified ([place]) 09/07/2013    Patient Active Problem List   Diagnosis Date Noted  . Normocytic anemia   . Pleural effusion 12/06/2016  . Pericardial effusion 12/06/2016  . Chest pain 12/06/2016  . Fever, unspecified 04/30/2015  . Constipation 04/29/2015  . GERD (gastroesophageal reflux disease) 04/29/2015  . Generalized anxiety disorder 03/02/2015  . TIA (transient ischemic attack) 02/10/2015  . Stroke-like symptoms 02/10/2015  . Neurologic disorder   . Non compliance with medical treatment 12/28/2014  . Mouth ulcers 12/28/2014  . Hypertension, secondary 10/13/2014  . Vitamin B12 deficiency 08/17/2014  . Vitamin D deficiency 08/17/2014  . Lupus (systemic lupus erythematosus) ([place]) 08/13/2014  . Leukopenia 09/07/2013  . Thrombocytopenia, unspecified ([place]) 09/07/2013    Past Surgical History:   Procedure Laterality Date  . IR THORACENTESIS ASP PLEURAL SPACE W/IMG GUIDE  12/07/2016  . TONSILLECTOMY AND ADENOIDECTOMY  2007       Family History  Problem Relation Age of Onset  . Lupus Neg Hx     Social History   Tobacco Use  . Smoking status: Former    Packs/day: 2.00    Years: 3.00    Pack years: 6.00    Types: Cigarettes    Start date: 04/09/2010    Quit date: 01/06/2014    Years since quitting: 7.1  . Smokeless tobacco: Never  Substance Use Topics  . Alcohol use: No    Alcohol/week: 0.0 standard drinks  . Drug use: No    Home Medications Prior to Admission medications   Medication Sig Start Date End Date Taking? Authorizing Provider  aspirin EC 81 MG EC tablet Take 1 tablet (81 mg total) by mouth daily. 02/12/15  Yes Thurnell Lose, MD  hydroxychloroquine (PLAQUENIL) 200 MG tablet Take 2 tablets (400 mg total) by mouth daily. 12/09/16  Yes Hosie Poisson, MD  lisinopril (ZESTRIL) 5 MG tablet Take 5 mg by mouth daily.   Yes [provider]  mycophenolate (CELLCEPT) 500 MG tablet Take 1,500 mg by mouth 2 (two) times daily.   Yes [provider]  predniSONE (DELTASONE) 20 MG tablet Prednisone 60 mg daily for 3 days followed by  Prednisone 40 mg daily for 3 days followed by  Prednisone 30 mg daily for  3 days followed by  Prednisone 20 mg daily for 3 days followed by  Prednisone 10 mg daily for 3 more days. 12/09/16  Yes Hosie Poisson, MD  docusate sodium (COLACE) 100 MG capsule Take 1 capsule (100 mg total) by mouth 2 (two) times daily. 12/09/16   Hosie Poisson, MD  NIFEdipine (PROCARDIA-XL/ADALAT-CC/NIFEDICAL-XL) 30 MG 24 hr tablet Take 30 mg by mouth daily.    [provider]  Vitamin D, Ergocalciferol, (DRISDOL) 50000 units CAPS capsule Take 50,000 Units by mouth once a week. 09/24/16   [provider]    Allergies    Promethazine  Review of Systems   Review of Systems  Constitutional:  Negative for chills and fever.  HENT:   Negative for ear pain and sore throat.   Eyes:  Negative for pain and visual disturbance.  Respiratory:  Positive for chest tightness and shortness of breath. Negative for cough.   Cardiovascular:  Positive for chest pain. Negative for palpitations.  Gastrointestinal:  Negative for abdominal pain and vomiting.  Genitourinary:  Negative for dysuria and hematuria.  Musculoskeletal:  Negative for arthralgias and back pain.  Skin:  Negative for color change and rash.  Neurological:  Negative for seizures and syncope.  All other systems reviewed and are negative.  Physical Exam Updated Vital Signs BP 129/83   Pulse 88   Temp 100.1 F (37.8 C)   Resp 18   SpO2 100%   Physical Exam Vitals and nursing note reviewed.  Constitutional:      Appearance: He is well-developed.  HENT:     Head: Normocephalic and atraumatic.  Eyes:     Conjunctiva/sclera: Conjunctivae normal.  Cardiovascular:     Rate and Rhythm: Normal rate and regular rhythm.     Heart sounds: No murmur heard. Pulmonary:     Effort: Pulmonary effort is normal. No respiratory distress.     Breath sounds: Normal breath sounds.  Chest:     Comments: Some tenderness over the right anterior chest wall Abdominal:     Palpations: Abdomen is soft.     Tenderness: There is no abdominal tenderness.  Musculoskeletal:     Cervical back: Neck supple.  Skin:    General: Skin is warm and dry.  Neurological:     General: No focal deficit present.     Mental Status: He is alert.  Psychiatric:        Mood and Affect: Mood normal.        Behavior: Behavior normal.    ED Results / Procedures / Treatments   Labs (all labs ordered are listed, but only abnormal results are displayed) Labs Reviewed  BASIC METABOLIC PANEL - Abnormal; Notable for the following components:      Result Value   Potassium 3.4 (*)    Glucose, Bld 101 (*)    Calcium 8.5 (*)    All other components within normal limits  CBC - Abnormal; Notable for the  following components:   HCT 38.2 (*)    All other components within normal limits  D-DIMER, QUANTITATIVE - Abnormal; Notable for the following components:   D-Dimer, Quant 12.58 (*)    All other components within normal limits  RESP PANEL BY RT-PCR (FLU A&B, COVID) ARPGX2  TROPONIN I (HIGH SENSITIVITY)  TROPONIN I (HIGH SENSITIVITY)    EKG EKG Interpretation  Date/Time:  Saturday February 11 2021 08:29:01 EDT Ventricular Rate:  94 PR Interval:  130 QRS Duration: 88 QT Interval:  339 QTC Calculation: 424  R Axis:   51 Text Interpretation: Sinus rhythm Probable left atrial enlargement Borderline T wave abnormalities Minimal ST elevation, anterior leads Baseline wander in lead(s) V2 V3 V4 V5 Confirmed by Marianna Fuss ([id number]) on 02/11/2021 8:31:34 AM  Radiology DG Chest 2 View  Result Date: 02/11/2021 CLINICAL DATA:  Chest pain.  History of lupus. EXAM: CHEST - 2 VIEW COMPARISON:  11/26/2018 FINDINGS: The heart size and mediastinal contours are within normal limits. Both lungs are clear. The visualized skeletal structures are unremarkable. IMPRESSION: No active cardiopulmonary disease. Electronically Signed   By: Marlan Palau M.D.   On: 02/11/2021 09:45   CT Angio Chest PE W and/or Wo Contrast  Result Date: 02/11/2021 CLINICAL DATA:  30 year old male with shortness of breath, possible PE EXAM: CT ANGIOGRAPHY CHEST WITH CONTRAST TECHNIQUE: Multidetector CT imaging of the chest was performed using the standard protocol during bolus administration of intravenous contrast. Multiplanar CT image reconstructions and MIPs were obtained to evaluate the vascular anatomy. CONTRAST:  OMNIPAQUE IOHEXOL 350 MG/ML SOLN COMPARISON:  CT chest 11/21/2016 FINDINGS: Cardiovascular: Heart: No cardiomegaly. No pericardial fluid/thickening. Compared to the prior CT there has been resolution of the prior pericardial effusion. No calcified coronary artery disease. Aorta: Unremarkable course, caliber,  contour of the thoracic aorta. No aneurysm or dissection flap. No periaortic fluid. Pulmonary arteries: No central, lobar, segmental, or proximal subsegmental filling defects. Mediastinum/Nodes: Decreased vague soft tissue/edema within the anterior mediastinum with decreasing size of small lymph nodes in the mediastinum compared to the prior CT. Unremarkable thoracic inlet. Unremarkable thoracic esophagus. Lungs/Pleura: Central airways are clear. No pleural effusion. No confluent airspace disease. No pneumothorax. Upper Abdomen: No acute. Musculoskeletal: No acute displaced fracture. Degenerative changes of the spine. Chest wall: Redemonstration of multiple lymph nodes in the bilateral axillary regions, which have all enlarged since the prior. Representative node on the right currently measures 2.5 cm, previously 1.5 cm and a representative node on the left now measures 1.8 cm, previously 1.2 cm. Review of the MIP images confirms the above findings. IMPRESSION: CT angiogram negative for pulmonary embolus. No acute finding of the chest. Interval resolution of pericardial effusion compared to the prior CT, with decreased size mediastinal lymph nodes. In the interval there has been enlargement in bilateral axillary lymph nodes, largest on the right measuring 2.5 cm, as above. General differential diagnosis includes reactive lymph nodes, granulomatous disease, and lymphoproliferative disorder. Electronically Signed   By: Gilmer Mor D.O.   On: 02/11/2021 11:19    Procedures Procedures   Medications Ordered in ED Medications  ketorolac (TORADOL) 15 MG/ML injection 15 mg (15 mg Intravenous Given 02/11/21 1004)  iohexol (OMNIPAQUE) 350 MG/ML injection 100 mL (100 mLs Intravenous Contrast Given 02/11/21 1045)    ED Course  I have reviewed the triage vital signs and the nursing notes.  Pertinent labs & imaging results that were available during my care of the patient were reviewed by me and considered in my  medical decision making (see chart for details).    MDM Rules/Calculators/A&P                           30 year old male with history of lupus presenting to ER with concern for chest pain.  On exam he appears well in no distress.  Vital signs stable.  Low-grade temperature noted but no true fever.  Basic labs grossly stable.  EKG without any definite new ischemic appearing changes.  Troponin x2 was within  normal limits.  Given the pleuritic nature of the pain, check D-dimer.  Significantly elevated.  CTA chest obtained, negative for pulmonary embolism.  Demonstrated resolution of patient's prior pericardial effusion and decreased size of his mediastinal lymph nodes.  Radiologist however did comment on enlargement of his bilateral axillary lymph nodes.  On reassessment, patient reports that his pain has completely resolved and his vital signs remained stable.  Given his overall clinical appearance, believe he is stable for discharge and outpatient management.  Recommend he follow-up with both his primary care doctor and his rheumatologist and discussed the CT findings and symptoms from today.  Reviewed return precautions and discharged home.  After the discussed management above, the patient was determined to be safe for discharge.  The patient was in agreement with this plan and all questions regarding their care were answered.  ED return precautions were discussed and the patient will return to the ED with any significant worsening of condition.     Final Clinical Impression(s) / ED Diagnoses Final diagnoses:  Chest pain, unspecified type    Rx / DC Orders ED Discharge Orders     None        Lucrezia Starch, MD 02/11/21 1235

## 2021-02-11 NOTE — ED Triage Notes (Signed)
Pt reports CP for past 3 days that is getting worse. Hx of lupus and "fluid around his lungs" one time he had similar symptoms. Also reports some shortness of breath ("has to take some deep breaths"). Pt speaking in complete sentences.

## 2021-02-11 NOTE — ED Notes (Signed)
Lab made aware of D-dimer order.

## 2021-02-11 NOTE — Discharge Instructions (Signed)
Recommend taking anti-inflammatory such as Motrin or naproxen as needed for pain.  Recommend following up with your primary care doctor and your rheumatologist.  Please discuss the enlarged lymph nodes and need for potential further monitoring with your regular doctors.  If you feel your chest pain is worsening, difficulty breathing develops or other new concerning symptom, fevers, please return to ER for reassessment.

## 2021-02-11 NOTE — ED Notes (Signed)
Pt reports pain started after using the slicer at work.  Pt is Production designer, theatre/television/film at [employer].

## 2021-12-15 ENCOUNTER — Encounter (HOSPITAL_COMMUNITY): Payer: Self-pay

## 2021-12-15 ENCOUNTER — Encounter (HOSPITAL_BASED_OUTPATIENT_CLINIC_OR_DEPARTMENT_OTHER): Payer: Self-pay

## 2021-12-15 ENCOUNTER — Inpatient Hospital Stay (HOSPITAL_BASED_OUTPATIENT_CLINIC_OR_DEPARTMENT_OTHER)
Admission: EM | Admit: 2021-12-15 | Discharge: 2021-12-17 | DRG: 683 | Disposition: A | Discharge status: Home / Self Care | Payer: Self-pay | Attending: Internal Medicine | Admitting: Internal Medicine

## 2021-12-15 ENCOUNTER — Other Ambulatory Visit: Payer: Self-pay

## 2021-12-15 ENCOUNTER — Emergency Department (HOSPITAL_BASED_OUTPATIENT_CLINIC_OR_DEPARTMENT_OTHER): Payer: Medicaid Other

## 2021-12-15 DIAGNOSIS — N179 Acute kidney failure, unspecified: Principal | ICD-10-CM | POA: Diagnosis present

## 2021-12-15 DIAGNOSIS — Z87891 Personal history of nicotine dependence: Secondary | ICD-10-CM

## 2021-12-15 DIAGNOSIS — M329 Systemic lupus erythematosus, unspecified: Secondary | ICD-10-CM | POA: Diagnosis present

## 2021-12-15 DIAGNOSIS — Z888 Allergy status to other drugs, medicaments and biological substances status: Secondary | ICD-10-CM

## 2021-12-15 DIAGNOSIS — M069 Rheumatoid arthritis, unspecified: Secondary | ICD-10-CM | POA: Diagnosis present

## 2021-12-15 DIAGNOSIS — E869 Volume depletion, unspecified: Secondary | ICD-10-CM | POA: Diagnosis present

## 2021-12-15 DIAGNOSIS — M549 Dorsalgia, unspecified: Secondary | ICD-10-CM | POA: Diagnosis present

## 2021-12-15 DIAGNOSIS — E559 Vitamin D deficiency, unspecified: Secondary | ICD-10-CM | POA: Diagnosis present

## 2021-12-15 DIAGNOSIS — I10 Essential (primary) hypertension: Secondary | ICD-10-CM | POA: Diagnosis present

## 2021-12-15 DIAGNOSIS — I161 Hypertensive emergency: Secondary | ICD-10-CM

## 2021-12-15 DIAGNOSIS — I159 Secondary hypertension, unspecified: Secondary | ICD-10-CM | POA: Diagnosis present

## 2021-12-15 DIAGNOSIS — Z7982 Long term (current) use of aspirin: Secondary | ICD-10-CM

## 2021-12-15 DIAGNOSIS — F419 Anxiety disorder, unspecified: Secondary | ICD-10-CM | POA: Diagnosis present

## 2021-12-15 DIAGNOSIS — Z79899 Other long term (current) drug therapy: Secondary | ICD-10-CM

## 2021-12-15 DIAGNOSIS — M3214 Glomerular disease in systemic lupus erythematosus: Secondary | ICD-10-CM | POA: Diagnosis present

## 2021-12-15 DIAGNOSIS — F32A Depression, unspecified: Secondary | ICD-10-CM | POA: Diagnosis present

## 2021-12-15 DIAGNOSIS — Z20822 Contact with and (suspected) exposure to covid-19: Secondary | ICD-10-CM | POA: Diagnosis present

## 2021-12-15 DIAGNOSIS — G8929 Other chronic pain: Secondary | ICD-10-CM | POA: Diagnosis present

## 2021-12-15 DIAGNOSIS — K219 Gastro-esophageal reflux disease without esophagitis: Secondary | ICD-10-CM | POA: Diagnosis present

## 2021-12-15 DIAGNOSIS — Z8673 Personal history of transient ischemic attack (TIA), and cerebral infarction without residual deficits: Secondary | ICD-10-CM

## 2021-12-15 LAB — CBC WITH DIFFERENTIAL/PLATELET
Abs Immature Granulocytes: 0.02 10*3/uL (ref 0.00–0.07)
Basophils Absolute: 0 10*3/uL (ref 0.0–0.1)
Basophils Relative: 0 %
Eosinophils Absolute: 0 10*3/uL (ref 0.0–0.5)
Eosinophils Relative: 1 %
HCT: 32.7 % — ABNORMAL LOW (ref 39.0–52.0)
Hemoglobin: 11 g/dL — ABNORMAL LOW (ref 13.0–17.0)
Immature Granulocytes: 1 %
Lymphocytes Relative: 25 %
Lymphs Abs: 0.8 10*3/uL (ref 0.7–4.0)
MCH: 29.3 pg (ref 26.0–34.0)
MCHC: 33.6 g/dL (ref 30.0–36.0)
MCV: 87.2 fL (ref 80.0–100.0)
Monocytes Absolute: 0.3 10*3/uL (ref 0.1–1.0)
Monocytes Relative: 8 %
Neutro Abs: 2.1 10*3/uL (ref 1.7–7.7)
Neutrophils Relative %: 65 %
Platelets: 244 10*3/uL (ref 150–400)
RBC: 3.75 MIL/uL — ABNORMAL LOW (ref 4.22–5.81)
RDW: 11.5 % (ref 11.5–15.5)
WBC: 3.3 10*3/uL — ABNORMAL LOW (ref 4.0–10.5)
nRBC: 0 % (ref 0.0–0.2)

## 2021-12-15 LAB — COMPREHENSIVE METABOLIC PANEL
ALT: 12 U/L (ref 0–44)
AST: 24 U/L (ref 15–41)
Albumin: 2.9 g/dL — ABNORMAL LOW (ref 3.5–5.0)
Alkaline Phosphatase: 47 U/L (ref 38–126)
Anion gap: 7 (ref 5–15)
BUN: 20 mg/dL (ref 6–20)
CO2: 26 mmol/L (ref 22–32)
Calcium: 7.4 mg/dL — ABNORMAL LOW (ref 8.9–10.3)
Chloride: 103 mmol/L (ref 98–111)
Creatinine, Ser: 2.01 mg/dL — ABNORMAL HIGH (ref 0.61–1.24)
GFR, Estimated: 44 mL/min — ABNORMAL LOW (ref >60)
Glucose, Bld: 119 mg/dL — ABNORMAL HIGH (ref 70–99)
Potassium: 3.3 mmol/L — ABNORMAL LOW (ref 3.5–5.1)
Sodium: 136 mmol/L (ref 135–145)
Total Bilirubin: 0.4 mg/dL (ref 0.3–1.2)
Total Protein: 5.9 g/dL — ABNORMAL LOW (ref 6.5–8.1)

## 2021-12-15 LAB — URINALYSIS, MICROSCOPIC (REFLEX)

## 2021-12-15 LAB — RESP PANEL BY RT-PCR (FLU A&B, COVID) ARPGX2
Influenza A by PCR: NEGATIVE
Influenza B by PCR: NEGATIVE
SARS Coronavirus 2 by RT PCR: NEGATIVE

## 2021-12-15 LAB — URINALYSIS, ROUTINE W REFLEX MICROSCOPIC
Bilirubin Urine: NEGATIVE
Glucose, UA: NEGATIVE mg/dL
Ketones, ur: NEGATIVE mg/dL
Leukocytes,Ua: NEGATIVE
Nitrite: NEGATIVE
Protein, ur: >=300 mg/dL — AB
Specific Gravity, Urine: 1.02 (ref 1.005–1.030)
pH: 5.5 (ref 5.0–8.0)

## 2021-12-15 LAB — C-REACTIVE PROTEIN: CRP: 2 mg/dL — ABNORMAL HIGH (ref <1.0)

## 2021-12-15 LAB — LIPASE, BLOOD: Lipase: 26 U/L (ref 11–51)

## 2021-12-15 MED ORDER — SODIUM CHLORIDE 0.9 % IV BOLUS
1000.0000 mL | Freq: Once | INTRAVENOUS | Status: AC
  Administered 2021-12-15: 1000 mL via INTRAVENOUS

## 2021-12-15 MED ORDER — HYDRALAZINE HCL 25 MG PO TABS
25.0000 mg | ORAL_TABLET | Freq: Four times a day (QID) | ORAL | Status: DC | PRN
  Administered 2021-12-15 – 2021-12-17 (×3): 25 mg via ORAL
  Filled 2021-12-15 (×3): qty 1

## 2021-12-15 NOTE — ED Triage Notes (Signed)
Patient was sent her by his PCP to have labs drawn for her when she sees him next week and a chest Xray due to patient coughing and vomiting x 1 week. Patient states he vomits when he has an empty stomach.  No coughing noted in triage. No acute distress noted in triage. Patient denies any abdominal pain or any discomfort in triage.

## 2021-12-15 NOTE — ED Provider Notes (Signed)
[facility] 3 EAST GENERAL SURGERY Provider Note  CSN: [id number] Arrival date & time: 12/15/21 1526  Chief Complaint(s) Emesis and Cough  HPI Dan Adams is a 31 y.o. male with history of rheumatoid arthritis, lupus presenting to the emergency department with cough.  Patient reports cough for the past 5 days with associated runny nose, mild abdominal pain, vomiting, diarrhea.  No fevers or chills.  No blood in his stool or vomit.  Symptoms mild.  Symptoms have significantly improved since onset.  He reports that he talked to his lupus doctor today, who wanted him to come to get checked out even though symptoms are getting better.  Patient reports he currently has no cough, no abdominal pain, no ongoing nausea or vomiting.   Past Medical History Past Medical History:  Diagnosis Date  . Anxiety   . Childhood asthma   . Chronic back pain   . Depression   . Hypertension   . Leukopenia 09/07/2013  . Migraine    "@ least 2-3 times/wk now" (02/10/2015)  . RA (rheumatoid arthritis) (HCC)    "feet, fingers, knees; it's from my lupus" (02/10/2015)  . Stroke-like symptoms 02/09/2015-02/10/2015   "right-left"  . Systemic lupus (HCC)   . Thrombocytopenia, unspecified (HCC) 09/07/2013   Patient Active Problem List   Diagnosis Date Noted  . AKI (acute kidney injury) (HCC) 12/15/2021  . Normocytic anemia   . Pleural effusion 12/06/2016  . Pericardial effusion 12/06/2016  . Chest pain 12/06/2016  . Fever, unspecified 04/30/2015  . Constipation 04/29/2015  . GERD (gastroesophageal reflux disease) 04/29/2015  . Generalized anxiety disorder 03/02/2015  . TIA (transient ischemic attack) 02/10/2015  . Stroke-like symptoms 02/10/2015  . Neurologic disorder   . Non compliance with medical treatment 12/28/2014  . Mouth ulcers 12/28/2014  . Hypertension, secondary 10/13/2014  . Vitamin B12 deficiency 08/17/2014  . Vitamin D deficiency 08/17/2014  . Lupus (systemic lupus erythematosus) (HCC) 08/13/2014   . Leukopenia 09/07/2013  . Thrombocytopenia, unspecified (HCC) 09/07/2013   Home Medication(s) Prior to Admission medications   Medication Sig Start Date End Date Taking? Authorizing Provider  aspirin EC 81 MG EC tablet Take 1 tablet (81 mg total) by mouth daily. 02/12/15   Leroy Sea, MD  docusate sodium (COLACE) 100 MG capsule Take 1 capsule (100 mg total) by mouth 2 (two) times daily. 12/09/16   Kathlen Mody, MD  hydroxychloroquine (PLAQUENIL) 200 MG tablet Take 2 tablets (400 mg total) by mouth daily. 12/09/16   Kathlen Mody, MD  lisinopril (ZESTRIL) 5 MG tablet Take 5 mg by mouth daily.    [provider]  mycophenolate (CELLCEPT) 500 MG tablet Take 1,500 mg by mouth 2 (two) times daily.    [provider]  NIFEdipine (PROCARDIA-XL/ADALAT-CC/NIFEDICAL-XL) 30 MG 24 hr tablet Take 30 mg by mouth daily.    [provider]  predniSONE (DELTASONE) 20 MG tablet Prednisone 60 mg daily for 3 days followed by  Prednisone 40 mg daily for 3 days followed by  Prednisone 30 mg daily for 3 days followed by  Prednisone 20 mg daily for 3 days followed by  Prednisone 10 mg daily for 3 more days. 12/09/16   Kathlen Mody, MD  Vitamin D, Ergocalciferol, (DRISDOL) 50000 units CAPS capsule Take 50,000 Units by mouth once a week. 09/24/16   [provider]  Past Surgical History Past Surgical History:  Procedure Laterality Date  . IR THORACENTESIS ASP PLEURAL SPACE W/IMG GUIDE  12/07/2016  . TONSILLECTOMY AND ADENOIDECTOMY  2007   Family History Family History  Problem Relation Age of Onset  . Lupus Neg Hx     Social History Social History   Tobacco Use  . Smoking status: Former    Packs/day: 2.00    Years: 3.00    Total pack years: 6.00    Types: Cigarettes    Start date: 04/09/2010    Quit date: 01/06/2014    Years since  quitting: 7.9  . Smokeless tobacco: Never  Substance Use Topics  . Alcohol use: No    Alcohol/week: 0.0 standard drinks of alcohol  . Drug use: No   Allergies Promethazine  Review of Systems Review of Systems  All other systems reviewed and are negative.   Physical Exam Vital Signs  I have reviewed the triage vital signs BP (!) 185/108   Pulse 66   Temp 98.6 F (37 C)   Resp 18   Ht 5\' 5"  (1.651 m)   Wt 99.8 kg   SpO2 100%   BMI 36.61 kg/m  Physical Exam Vitals and nursing note reviewed.  Constitutional:      General: He is not in acute distress.    Appearance: Normal appearance.  HENT:     Mouth/Throat:     Mouth: Mucous membranes are moist.  Eyes:     Conjunctiva/sclera: Conjunctivae normal.  Cardiovascular:     Rate and Rhythm: Normal rate and regular rhythm.  Pulmonary:     Effort: Pulmonary effort is normal. No respiratory distress.     Breath sounds: Normal breath sounds.  Abdominal:     General: Abdomen is flat.     Palpations: Abdomen is soft.     Tenderness: There is no abdominal tenderness.  Musculoskeletal:     Right lower leg: No edema.     Left lower leg: No edema.  Skin:    General: Skin is warm and dry.     Capillary Refill: Capillary refill takes less than 2 seconds.  Neurological:     Mental Status: He is alert and oriented to person, place, and time. Mental status is at baseline.  Psychiatric:        Mood and Affect: Mood normal.        Behavior: Behavior normal.     ED Results and Treatments Labs (all labs ordered are listed, but only abnormal results are displayed) Labs Reviewed  COMPREHENSIVE METABOLIC PANEL - Abnormal; Notable for the following components:      Result Value   Potassium 3.3 (*)    Glucose, Bld 119 (*)    Creatinine, Ser 2.01 (*)    Calcium 7.4 (*)    Total Protein 5.9 (*)    Albumin 2.9 (*)    GFR, Estimated 44 (*)    All other components within normal limits  CBC WITH DIFFERENTIAL/PLATELET - Abnormal;  Notable for the following components:   WBC 3.3 (*)    RBC 3.75 (*)    Hemoglobin 11.0 (*)    HCT 32.7 (*)    All other components within normal limits  URINALYSIS, ROUTINE W REFLEX MICROSCOPIC - Abnormal; Notable for the following components:   Hgb urine dipstick LARGE (*)    Protein, ur >=300 (*)    All other components within normal limits  URINALYSIS, MICROSCOPIC (REFLEX) - Abnormal; Notable for the following components:  Bacteria, UA FEW (*)    All other components within normal limits  RESP PANEL BY RT-PCR (FLU A&B, COVID) ARPGX2  LIPASE, BLOOD  C-REACTIVE PROTEIN                                                                                                                          Radiology DG Chest 1 View  Result Date: 12/15/2021 CLINICAL DATA:  Cough EXAM: CHEST  1 VIEW COMPARISON:  Chest x-ray 02/11/2021 FINDINGS: The heart size and mediastinal contours are within normal limits. Both lungs are clear. The visualized skeletal structures are unremarkable. IMPRESSION: No active disease. Electronically Signed   By: Ronney Asters M.D.   On: 12/15/2021 16:11    Pertinent labs & imaging results that were available during my care of the patient were reviewed by me and considered in my medical decision making (see MDM for details).  Medications Ordered in ED Medications  sodium chloride 0.9 % bolus 1,000 mL (0 mLs Intravenous Stopped 12/15/21 1745)  sodium chloride 0.9 % bolus 1,000 mL (0 mLs Intravenous Stopped 12/15/21 1936)                                                                                                                                     Procedures Procedures  (including critical care time)  Medical Decision Making / ED Course   MDM:  31 year old male with lupus presenting with cough.  Patient overall well-appearing, he has no current symptoms.  Exam with clear lungs, no abdominal tenderness.  Vital signs reassuring other than hypertension.  Given history of  lupus, will obtain basic lab test as well as CRP, hepatic function panel.  We will also obtain COVID test as patient's constellation of symptoms could represent viral illness.  Given cough will obtain chest x-ray of the lungs clear, doubt pneumonia.  No sign of volume overload on exam to suggest pulmonary edema.  Lower concern for lupus flare, patient denies joint pain, reports medication compliance.  Clinical Course as of 12/15/21 2144  Fri Dec 15, 2021  1853 Dr. Royce Macadamia with nephrology recommends admission for hydration.  [WS]  2143 Admitted to hospitalist service for acute kidney injury. Dr. Cyd Silence accepting.  [WS]    Clinical Course User Index [WS] Truett Mainland Livingston Diones, MD     Additional history obtained: -External records from outside source obtained and reviewed including: Chart review including  previous notes, labs, imaging, consultation notes   Lab Tests: -I ordered, reviewed, and interpreted labs.   The pertinent results include:   Labs Reviewed  COMPREHENSIVE METABOLIC PANEL - Abnormal; Notable for the following components:      Result Value   Potassium 3.3 (*)    Glucose, Bld 119 (*)    Creatinine, Ser 2.01 (*)    Calcium 7.4 (*)    Total Protein 5.9 (*)    Albumin 2.9 (*)    GFR, Estimated 44 (*)    All other components within normal limits  CBC WITH DIFFERENTIAL/PLATELET - Abnormal; Notable for the following components:   WBC 3.3 (*)    RBC 3.75 (*)    Hemoglobin 11.0 (*)    HCT 32.7 (*)    All other components within normal limits  URINALYSIS, ROUTINE W REFLEX MICROSCOPIC - Abnormal; Notable for the following components:   Hgb urine dipstick LARGE (*)    Protein, ur >=300 (*)    All other components within normal limits  URINALYSIS, MICROSCOPIC (REFLEX) - Abnormal; Notable for the following components:   Bacteria, UA FEW (*)    All other components within normal limits  RESP PANEL BY RT-PCR (FLU A&B, COVID) ARPGX2  LIPASE, BLOOD  C-REACTIVE PROTEIN       EKG   EKG Interpretation  Date/Time:  Friday December 15 2021 16:23:29 EDT Ventricular Rate:  78 PR Interval:  125 QRS Duration: 87 QT Interval:  391 QTC Calculation: 446 R Axis:   23 Text Interpretation: Sinus rhythm Left atrial enlargement Nonspecific T abnormalities, inferior leads Similar to prior ECGs but anterior TWI have improved Confirmed by Alvino Blood ([id number]) on 12/15/2021 4:25:51 PM         Imaging Studies ordered: I ordered imaging studies including CXR On my interpretation imaging demonstrates clear lungs I independently visualized and interpreted imaging. I agree with the radiologist interpretation   Medicines ordered and prescription drug management: Meds ordered this encounter  Medications  . sodium chloride 0.9 % bolus 1,000 mL  . sodium chloride 0.9 % bolus 1,000 mL    -I have reviewed the patients home medicines and have made adjustments as needed   Consultations Obtained: I requested consultation with the nephrologist,  and discussed lab and imaging findings as well as pertinent plan - they recommend: admission   Cardiac Monitoring: The patient was maintained on a cardiac monitor.  I personally viewed and interpreted the cardiac monitored which showed an underlying rhythm of: NSR  Social Determinants of Health:  Factors impacting patients care include: former smoker   Reevaluation: After the interventions noted above, I reevaluated the patient and found that they have resolved  Co morbidities that complicate the patient evaluation . Past Medical History:  Diagnosis Date  . Anxiety   . Childhood asthma   . Chronic back pain   . Depression   . Hypertension   . Leukopenia 09/07/2013  . Migraine    "@ least 2-3 times/wk now" (02/10/2015)  . RA (rheumatoid arthritis) (HCC)    "feet, fingers, knees; it's from my lupus" (02/10/2015)  . Stroke-like symptoms 02/09/2015-02/10/2015   "right-left"  . Systemic lupus (HCC)   .  Thrombocytopenia, unspecified (HCC) 09/07/2013      Dispostion: Admit    Final Clinical Impression(s) / ED Diagnoses Final diagnoses:  Acute kidney injury [facility])     This chart was dictated using voice recognition software.  Despite best efforts to proofread,  errors can occur which can change  the documentation meaning.    Cristie Hem, MD 12/15/21 2144

## 2021-12-15 NOTE — ED Notes (Signed)
Reports missing 3 doses of his "Lupkynis" for kidney function.   Presented to PCP for nausea, vomiting x 1 week. Did labs and found elevated kidney functions.

## 2021-12-16 ENCOUNTER — Encounter (HOSPITAL_COMMUNITY): Payer: Self-pay | Admitting: Family Medicine

## 2021-12-16 DIAGNOSIS — M329 Systemic lupus erythematosus, unspecified: Secondary | ICD-10-CM | POA: Diagnosis present

## 2021-12-16 DIAGNOSIS — I161 Hypertensive emergency: Secondary | ICD-10-CM

## 2021-12-16 LAB — COMPREHENSIVE METABOLIC PANEL
ALT: 10 U/L (ref 0–44)
AST: 18 U/L (ref 15–41)
Albumin: 2.7 g/dL — ABNORMAL LOW (ref 3.5–5.0)
Alkaline Phosphatase: 48 U/L (ref 38–126)
Anion gap: 5 (ref 5–15)
BUN: 17 mg/dL (ref 6–20)
CO2: 26 mmol/L (ref 22–32)
Calcium: 7.5 mg/dL — ABNORMAL LOW (ref 8.9–10.3)
Chloride: 108 mmol/L (ref 98–111)
Creatinine, Ser: 1.61 mg/dL — ABNORMAL HIGH (ref 0.61–1.24)
GFR, Estimated: 58 mL/min — ABNORMAL LOW (ref >60)
Glucose, Bld: 93 mg/dL (ref 70–99)
Potassium: 3.5 mmol/L (ref 3.5–5.1)
Sodium: 139 mmol/L (ref 135–145)
Total Bilirubin: 0.6 mg/dL (ref 0.3–1.2)
Total Protein: 5.5 g/dL — ABNORMAL LOW (ref 6.5–8.1)

## 2021-12-16 LAB — CBC WITH DIFFERENTIAL/PLATELET
Abs Immature Granulocytes: 0.02 10*3/uL (ref 0.00–0.07)
Basophils Absolute: 0 10*3/uL (ref 0.0–0.1)
Basophils Relative: 0 %
Eosinophils Absolute: 0 10*3/uL (ref 0.0–0.5)
Eosinophils Relative: 1 %
HCT: 34.3 % — ABNORMAL LOW (ref 39.0–52.0)
Hemoglobin: 11.4 g/dL — ABNORMAL LOW (ref 13.0–17.0)
Immature Granulocytes: 1 %
Lymphocytes Relative: 27 %
Lymphs Abs: 0.8 10*3/uL (ref 0.7–4.0)
MCH: 29.5 pg (ref 26.0–34.0)
MCHC: 33.2 g/dL (ref 30.0–36.0)
MCV: 88.6 fL (ref 80.0–100.0)
Monocytes Absolute: 0.3 10*3/uL (ref 0.1–1.0)
Monocytes Relative: 9 %
Neutro Abs: 1.8 10*3/uL (ref 1.7–7.7)
Neutrophils Relative %: 62 %
Platelets: 218 10*3/uL (ref 150–400)
RBC: 3.87 MIL/uL — ABNORMAL LOW (ref 4.22–5.81)
RDW: 11.6 % (ref 11.5–15.5)
WBC: 2.9 10*3/uL — ABNORMAL LOW (ref 4.0–10.5)
nRBC: 0 % (ref 0.0–0.2)

## 2021-12-16 LAB — MAGNESIUM: Magnesium: 2 mg/dL (ref 1.7–2.4)

## 2021-12-16 LAB — PHOSPHORUS: Phosphorus: 2.8 mg/dL (ref 2.5–4.6)

## 2021-12-16 MED ORDER — ACETAMINOPHEN 650 MG RE SUPP: 650.0000 mg | Freq: Four times a day (QID) | RECTAL | Status: DC | PRN

## 2021-12-16 MED ORDER — ENOXAPARIN SODIUM 40 MG/0.4ML IJ SOSY
40.0000 mg | PREFILLED_SYRINGE | INTRAMUSCULAR | Status: DC
  Administered 2021-12-16 – 2021-12-17 (×2): 40 mg via SUBCUTANEOUS
  Filled 2021-12-16 (×2): qty 0.4

## 2021-12-16 MED ORDER — MYCOPHENOLATE MOFETIL 250 MG PO CAPS
1500.0000 mg | ORAL_CAPSULE | Freq: Two times a day (BID) | ORAL | Status: DC
  Administered 2021-12-16 – 2021-12-17 (×3): 1500 mg via ORAL
  Filled 2021-12-16 (×6): qty 6

## 2021-12-16 MED ORDER — SODIUM CHLORIDE 0.9 % IV SOLN: INTRAVENOUS | Status: DC

## 2021-12-16 MED ORDER — LABETALOL HCL 5 MG/ML IV SOLN
10.0000 mg | INTRAVENOUS | Status: DC | PRN
Start: 2021-12-16 — End: 2021-12-17
  Administered 2021-12-16 – 2021-12-17 (×4): 10 mg via INTRAVENOUS
  Filled 2021-12-16 (×6): qty 4

## 2021-12-16 MED ORDER — ONDANSETRON HCL 4 MG/2ML IJ SOLN
4.0000 mg | Freq: Four times a day (QID) | INTRAMUSCULAR | Status: DC | PRN
  Filled 2021-12-16: qty 2

## 2021-12-16 MED ORDER — PREDNISONE 5 MG PO TABS
7.5000 mg | ORAL_TABLET | Freq: Every day | ORAL | Status: DC
  Administered 2021-12-16 – 2021-12-17 (×2): 7.5 mg via ORAL
  Filled 2021-12-16 (×2): qty 2

## 2021-12-16 MED ORDER — HYDROXYCHLOROQUINE SULFATE 200 MG PO TABS
400.0000 mg | ORAL_TABLET | Freq: Every day | ORAL | Status: DC
  Administered 2021-12-16 – 2021-12-17 (×2): 400 mg via ORAL
  Filled 2021-12-16 (×2): qty 2

## 2021-12-16 MED ORDER — ASPIRIN 81 MG PO TBEC
81.0000 mg | DELAYED_RELEASE_TABLET | Freq: Every day | ORAL | Status: DC
  Administered 2021-12-16 – 2021-12-17 (×2): 81 mg via ORAL
  Filled 2021-12-16 (×2): qty 1

## 2021-12-16 MED ORDER — ACETAMINOPHEN 325 MG PO TABS
650.0000 mg | ORAL_TABLET | Freq: Four times a day (QID) | ORAL | Status: DC | PRN
  Filled 2021-12-16: qty 2

## 2021-12-16 MED ORDER — ONDANSETRON HCL 4 MG PO TABS: 4.0000 mg | ORAL_TABLET | Freq: Four times a day (QID) | ORAL | Status: DC | PRN

## 2021-12-16 MED ORDER — ENOXAPARIN SODIUM 40 MG/0.4ML IJ SOSY: 40.0000 mg | PREFILLED_SYRINGE | INTRAMUSCULAR | Status: DC

## 2021-12-16 MED ORDER — AMLODIPINE BESYLATE 10 MG PO TABS
10.0000 mg | ORAL_TABLET | Freq: Every day | ORAL | Status: DC
  Administered 2021-12-16 – 2021-12-17 (×2): 10 mg via ORAL
  Filled 2021-12-16 (×2): qty 1

## 2021-12-16 MED ORDER — CLONIDINE HCL 0.1 MG PO TABS
0.1000 mg | ORAL_TABLET | Freq: Two times a day (BID) | ORAL | Status: DC | PRN
  Administered 2021-12-17: 0.1 mg via ORAL
  Filled 2021-12-16 (×2): qty 1

## 2021-12-16 NOTE — Progress Notes (Signed)
PROGRESS NOTE    Dan Adams  JJK:[id number] DOB: 1990/04/14 DOA: 12/15/2021 PCP: Tresa Garter, MD     Brief Narrative:  Dan Adams is a 31 y.o. male with medical history significant for lupus, lupus nephritis, and hypertension, presenting to the emergency department with recent cough, nausea, vomiting, and diarrhea.  He admits to symptoms starting 9/3.  Symptoms have slowly been getting better over the past couple of days.  In the emergency department, creatinine noted to be elevated to 2.01.  Patient was given IV fluids and admitted to the hospital with nephrology consultation.  New events last 24 hours / Subjective: Feeling tired.  Was able to eat breakfast and keep down.  No vomiting.  No abdominal pain or diarrhea.  Assessment & Plan:   Principal Problem:   AKI (acute kidney injury) (HCC) Active Problems:   Lupus (systemic lupus erythematosus) (HCC)   Hypertensive emergency   AKI, history of lupus nephritis -Creatinine improved with IV fluid -Nephrology consult -Hold lisinopril  Hypertensive emergency, with AKI -Holding lisinopril due to kidney dysfunction -Norvasc   Lupus -Continue Plaquenil, CellCept, prednisone -Followed by Dr. Thedore Mins at [facility] nephrology    DVT prophylaxis:  enoxaparin (LOVENOX) injection 40 mg Start: 12/16/21 1000  Code Status: Full Family Communication: None at bedside  Disposition Plan:  Status is: Observation The patient will require care spanning > 2 midnights and should be moved to inpatient because: Nephrology consult, IV fluid  Antimicrobials:  Anti-infectives (From admission, onward)    Start     Dose/Rate Route Frequency Ordered Stop   12/16/21 1000  hydroxychloroquine (PLAQUENIL) tablet 400 mg        400 mg Oral Daily 12/16/21 0222          Objective: Vitals:   12/16/21 0722 12/16/21 0848 12/16/21 0950 12/16/21 1121  BP: (!) 189/108 (!) 227/127 (!) 174/104 (!) 177/102  Pulse: 71 83  78  Resp:  20  19   Temp:  97.9 F (36.6 C)  98.4 F (36.9 C)  TempSrc:  Oral  Oral  SpO2: 100% 100%  100%  Weight:      Height:        Intake/Output Summary (Last 24 hours) at 12/16/2021 1223 Last data filed at 12/16/2021 0635 Gross per 24 hour  Intake 1240 ml  Output 0 ml  Net 1240 ml   Filed Weights   12/15/21 1532  Weight: 99.8 kg    Examination:  General exam: Appears calm and comfortable  Respiratory system: Clear to auscultation. Respiratory effort normal. No respiratory distress. No conversational dyspnea.  Cardiovascular system: S1 & S2 heard, RRR. No murmurs. No pedal edema. Gastrointestinal system: Abdomen is nondistended, soft and nontender. Normal bowel sounds heard. Central nervous system: Alert and oriented. No focal neurological deficits. Speech clear.  Extremities: Symmetric in appearance  Skin: No rashes, lesions or ulcers on exposed skin  Psychiatry: Judgement and insight appear normal. Mood & affect appropriate.   Data Reviewed: I have personally reviewed following labs and imaging studies  CBC: Recent Labs  Lab 12/15/21 1616 12/16/21 0541  WBC 3.3* 2.9*  NEUTROABS 2.1 1.8  HGB 11.0* 11.4*  HCT 32.7* 34.3*  MCV 87.2 88.6  PLT 244 218   Basic Metabolic Panel: Recent Labs  Lab 12/15/21 1616 12/16/21 0541  NA 136 139  K 3.3* 3.5  CL 103 108  CO2 26 26  GLUCOSE 119* 93  BUN 20 17  CREATININE 2.01* 1.61*  CALCIUM 7.4* 7.5*  MG  --  2.0  PHOS  --  2.8   GFR: Estimated Creatinine Clearance: 71.6 mL/min (A) (by C-G formula based on SCr of 1.61 mg/dL (H)). Liver Function Tests: Recent Labs  Lab 12/15/21 1616 12/16/21 0541  AST 24 18  ALT 12 10  ALKPHOS 47 48  BILITOT 0.4 0.6  PROT 5.9* 5.5*  ALBUMIN 2.9* 2.7*   Recent Labs  Lab 12/15/21 1616  LIPASE 26   No results for input(s): "AMMONIA" in the last 168 hours. Coagulation Profile: No results for input(s): "INR", "PROTIME" in the last 168 hours. Cardiac Enzymes: No results for input(s):  "CKTOTAL", "CKMB", "CKMBINDEX", "TROPONINI" in the last 168 hours. BNP (last 3 results) No results for input(s): "PROBNP" in the last 8760 hours. HbA1C: No results for input(s): "HGBA1C" in the last 72 hours. CBG: No results for input(s): "GLUCAP" in the last 168 hours. Lipid Profile: No results for input(s): "CHOL", "HDL", "LDLCALC", "TRIG", "CHOLHDL", "LDLDIRECT" in the last 72 hours. Thyroid Function Tests: No results for input(s): "TSH", "T4TOTAL", "FREET4", "T3FREE", "THYROIDAB" in the last 72 hours. Anemia Panel: No results for input(s): "VITAMINB12", "FOLATE", "FERRITIN", "TIBC", "IRON", "RETICCTPCT" in the last 72 hours. Sepsis Labs: No results for input(s): "PROCALCITON", "LATICACIDVEN" in the last 168 hours.  Recent Results (from the past 240 hour(s))  Resp Panel by RT-PCR (Flu A&B, Covid) Anterior Nasal Swab     Status: None   Collection Time: 12/15/21  4:16 PM   Specimen: Anterior Nasal Swab  Result Value Ref Range Status   SARS Coronavirus 2 by RT PCR NEGATIVE NEGATIVE Final    Comment: (NOTE) SARS-CoV-2 target nucleic acids are NOT DETECTED.  The SARS-CoV-2 RNA is generally detectable in upper respiratory specimens during the acute phase of infection. The lowest concentration of SARS-CoV-2 viral copies this assay can detect is 138 copies/mL. A negative result does not preclude SARS-Cov-2 infection and should not be used as the sole basis for treatment or other patient management decisions. A negative result may occur with  improper specimen collection/handling, submission of specimen other than nasopharyngeal swab, presence of viral mutation(s) within the areas targeted by this assay, and inadequate number of viral copies(<138 copies/mL). A negative result must be combined with clinical observations, patient history, and epidemiological information. The expected result is Negative.  Fact Sheet for Patients:  BloggerCourse.com  Fact Sheet  for Healthcare Providers:  SeriousBroker.it  This test is no t yet approved or cleared by the [place] FDA and  has been authorized for detection and/or diagnosis of SARS-CoV-2 by FDA under an Emergency Use Authorization (EUA). This EUA will remain  in effect (meaning this test can be used) for the duration of the COVID-19 declaration under Section 564(b)(1) of the Act, 21 U.S.C.section 360bbb-3(b)(1), unless the authorization is terminated  or revoked sooner.       Influenza A by PCR NEGATIVE NEGATIVE Final   Influenza B by PCR NEGATIVE NEGATIVE Final    Comment: (NOTE) The Xpert Xpress SARS-CoV-2/FLU/RSV plus assay is intended as an aid in the diagnosis of influenza from Nasopharyngeal swab specimens and should not be used as a sole basis for treatment. Nasal washings and aspirates are unacceptable for Xpert Xpress SARS-CoV-2/FLU/RSV testing.  Fact Sheet for Patients: BloggerCourse.com  Fact Sheet for Healthcare Providers: SeriousBroker.it  This test is not yet approved or cleared by the [place] FDA and has been authorized for detection and/or diagnosis of SARS-CoV-2 by FDA under an Emergency Use Authorization (EUA). This EUA will remain in effect (meaning this  test can be used) for the duration of the COVID-19 declaration under Section 564(b)(1) of the Act, 21 U.S.C. section 360bbb-3(b)(1), unless the authorization is terminated or revoked.  Performed at [facility], [address] [place], [place] [address]       Radiology Studies: DG Chest 1 View  Result Date: 12/15/2021 CLINICAL DATA:  Cough EXAM: CHEST  1 VIEW COMPARISON:  Chest x-ray 02/11/2021 FINDINGS: The heart size and mediastinal contours are within normal limits. Both lungs are clear. The visualized skeletal structures are unremarkable. IMPRESSION: No active disease. Electronically Signed   By: Darliss Cheney M.D.    On: 12/15/2021 16:11      Scheduled Meds:  amLODipine  10 mg Oral Daily   aspirin EC  81 mg Oral Daily   enoxaparin (LOVENOX) injection  40 mg Subcutaneous Q24H   hydroxychloroquine  400 mg Oral Daily   mycophenolate  1,500 mg Oral BID   predniSONE  7.5 mg Oral Q breakfast   Continuous Infusions:  sodium chloride       LOS: 0 days     Noralee Stain, DO [facility] 12/16/2021, 12:23 PM   Available via Epic secure chat 7am-7pm After these hours, please refer to coverage provider listed on [email or web address]

## 2021-12-16 NOTE — H&P (Addendum)
History and Physical    Uchechukwu Dhawan JJH:[id number] DOB: December 08, 1990 DOA: 12/15/2021  PCP: Tresa Garter, MD   Patient coming from: Home   Chief Complaint: Recent cough, N/V/D   HPI: Dan Adams is a 31 y.o. male with medical history significant for lupus and hypertension, presenting to the emergency department with recent cough, nausea, vomiting, and diarrhea.  Patient reports that he had recently been suffering a mild cough with nausea, some episodes of vomiting, and some mild diarrhea.  The symptoms have significantly improved and essentially resolved at this point but he was advised by his rheumatologist to get labs and chest x-ray.  He denies any fever, chills, abdominal pain, rash, or chest pain.  He has some leg swelling when he is on his feet all day but denies any currently. He has not taken his medications in a couple days.   [facility] ED Course: Upon arrival to the ED, patient is found to be afebrile and saturating well on room air with elevated blood pressures.  ED work-up notable for creatinine of 2.01, up from 1.5 in July.  Urinalysis revealed large hemoglobin and >300 protein.  Nephrology (Dr. Malen Gauze) was consulted by the ED physician, patient was given 2 L normal saline, and he was transferred to [facility] for admission.  Review of Systems:  All other systems reviewed and apart from HPI, are negative.  Past Medical History:  Diagnosis Date   Anxiety    Childhood asthma    Chronic back pain    Depression    Hypertension    Leukopenia 09/07/2013   Migraine    "@ least 2-3 times/wk now" (02/10/2015)   RA (rheumatoid arthritis) (HCC)    "feet, fingers, knees; it's from my lupus" (02/10/2015)   Stroke-like symptoms 02/09/2015-02/10/2015   "right-left"   Systemic lupus (HCC)    Thrombocytopenia, unspecified (HCC) 09/07/2013    Past Surgical History:  Procedure Laterality Date   IR THORACENTESIS ASP PLEURAL SPACE W/IMG GUIDE  12/07/2016   TONSILLECTOMY  AND ADENOIDECTOMY  2007    Social History:   reports that he quit smoking about 7 years ago. He started smoking about 11 years ago. He has a 6.00 pack-year smoking history. He has never used smokeless tobacco. He reports that he does not drink alcohol and does not use drugs.  Allergies  Allergen Reactions   Promethazine Nausea And Vomiting    Family History  Problem Relation Age of Onset   Lupus Neg Hx      Prior to Admission medications   Medication Sig Start Date End Date Taking? Authorizing Provider  aspirin EC 81 MG EC tablet Take 1 tablet (81 mg total) by mouth daily. 02/12/15   Leroy Sea, MD  docusate sodium (COLACE) 100 MG capsule Take 1 capsule (100 mg total) by mouth 2 (two) times daily. 12/09/16   Kathlen Mody, MD  hydroxychloroquine (PLAQUENIL) 200 MG tablet Take 2 tablets (400 mg total) by mouth daily. 12/09/16   Kathlen Mody, MD  lisinopril (ZESTRIL) 5 MG tablet Take 5 mg by mouth daily.    [provider]  mycophenolate (CELLCEPT) 500 MG tablet Take 1,500 mg by mouth 2 (two) times daily.    [provider]  NIFEdipine (PROCARDIA-XL/ADALAT-CC/NIFEDICAL-XL) 30 MG 24 hr tablet Take 30 mg by mouth daily.    [provider]  predniSONE (DELTASONE) 20 MG tablet Prednisone 60 mg daily for 3 days followed by  Prednisone 40 mg daily for 3 days followed by  Prednisone 30 mg daily for 3 days followed by  Prednisone 20 mg daily for 3 days followed by  Prednisone 10 mg daily for 3 more days. 12/09/16   Kathlen Mody, MD  Vitamin D, Ergocalciferol, (DRISDOL) 50000 units CAPS capsule Take 50,000 Units by mouth once a week. 09/24/16   [provider]    Physical Exam: Vitals:   12/15/21 2039 12/15/21 2104 12/15/21 2306 12/16/21 0153  BP:  (!) 185/108 (!) 182/105 (!) 168/102  Pulse: 73 66 71 74  Resp:  Temp:  98.6 F (37 C) 98.4 F (36.9 C) 98.6 F (37 C)  TempSrc:   Oral Oral  SpO2: 100% 100% 100% 100%  Weight:      Height:     (1.651 m)     Constitutional: NAD, no diaphoresis or pallor   Eyes: PERTLA, lids and conjunctivae normal ENMT: Mucous membranes are moist. Posterior pharynx clear of any exudate or lesions.   Neck: supple, no masses  Respiratory: no wheezing, no crackles. No accessory muscle use.  Cardiovascular: S1 & S2 heard, regular rate and rhythm. No extremity edema.  Abdomen: No distension, no tenderness, soft. Bowel sounds active.  Musculoskeletal: no clubbing / cyanosis. No joint deformity upper and lower extremities.   Skin: no significant rashes, lesions, ulcers. Warm, dry, well-perfused. Neurologic: CN 2-12 grossly intact. Moving all extremities. Alert and oriented.  Psychiatric: Calm. Cooperative.    Labs and Imaging on Admission: I have personally reviewed following labs and imaging studies  CBC: Recent Labs  Lab 12/15/21 1616  WBC 3.3*  NEUTROABS 2.1  HGB 11.0*  HCT 32.7*  MCV 87.2  PLT 244   Basic Metabolic Panel: Recent Labs  Lab 12/15/21 1616  NA 136  K 3.3*  CL 103  CO2 26  GLUCOSE 119*  BUN 20  CREATININE 2.01*  CALCIUM 7.4*   GFR: Estimated Creatinine Clearance: 57.3 mL/min (A) (by C-G formula based on SCr of 2.01 mg/dL (H)). Liver Function Tests: Recent Labs  Lab 12/15/21 1616  AST 24  ALT 12  ALKPHOS 47  BILITOT 0.4  PROT 5.9*  ALBUMIN 2.9*   Recent Labs  Lab 12/15/21 1616  LIPASE 26   No results for input(s): "AMMONIA" in the last 168 hours. Coagulation Profile: No results for input(s): "INR", "PROTIME" in the last 168 hours. Cardiac Enzymes: No results for input(s): "CKTOTAL", "CKMB", "CKMBINDEX", "TROPONINI" in the last 168 hours. BNP (last 3 results) No results for input(s): "PROBNP" in the last 8760 hours. HbA1C: No results for input(s): "HGBA1C" in the last 72 hours. CBG: No results for input(s): "GLUCAP" in the last 168 hours. Lipid Profile: No results for input(s): "CHOL", "HDL", "LDLCALC", "TRIG", "CHOLHDL", "LDLDIRECT" in  the last 72 hours. Thyroid Function Tests: No results for input(s): "TSH", "T4TOTAL", "FREET4", "T3FREE", "THYROIDAB" in the last 72 hours. Anemia Panel: No results for input(s): "VITAMINB12", "FOLATE", "FERRITIN", "TIBC", "IRON", "RETICCTPCT" in the last 72 hours. Urine analysis:    Component Value Date/Time   COLORURINE YELLOW 12/15/2021 1809   APPEARANCEUR CLEAR 12/15/2021 1809   LABSPEC 1.020 12/15/2021 1809   PHURINE 5.5 12/15/2021 1809   GLUCOSEU NEGATIVE 12/15/2021 1809   GLUCOSEU NEGATIVE 04/29/2015 0923   HGBUR LARGE (A) 12/15/2021 1809   BILIRUBINUR NEGATIVE 12/15/2021 1809   KETONESUR NEGATIVE 12/15/2021 1809   PROTEINUR >=300 (A) 12/15/2021 1809   UROBILINOGEN 0.2 04/29/2015 0923   NITRITE NEGATIVE 12/15/2021 1809   LEUKOCYTESUR NEGATIVE 12/15/2021 1809   Sepsis Labs: @  LABRCNTIP(procalcitonin:4,lacticidven:4) ) Recent Results (from the past 240 hour(s))  Resp Panel by RT-PCR (Flu A&B, Covid) Anterior Nasal Swab     Status: None   Collection Time: 12/15/21  4:16 PM   Specimen: Anterior Nasal Swab  Result Value Ref Range Status   SARS Coronavirus 2 by RT PCR NEGATIVE NEGATIVE Final    Comment: (NOTE) SARS-CoV-2 target nucleic acids are NOT DETECTED.  The SARS-CoV-2 RNA is generally detectable in upper respiratory specimens during the acute phase of infection. The lowest concentration of SARS-CoV-2 viral copies this assay can detect is 138 copies/mL. A negative result does not preclude SARS-Cov-2 infection and should not be used as the sole basis for treatment or other patient management decisions. A negative result may occur with  improper specimen collection/handling, submission of specimen other than nasopharyngeal swab, presence of viral mutation(s) within the areas targeted by this assay, and inadequate number of viral copies(<138 copies/mL). A negative result must be combined with clinical observations, patient history, and epidemiological information. The  expected result is Negative.  Fact Sheet for Patients:  BloggerCourse.com  Fact Sheet for Healthcare Providers:  SeriousBroker.it  This test is no t yet approved or cleared by the [place] FDA and  has been authorized for detection and/or diagnosis of SARS-CoV-2 by FDA under an Emergency Use Authorization (EUA). This EUA will remain  in effect (meaning this test can be used) for the duration of the COVID-19 declaration under Section 564(b)(1) of the Act, 21 U.S.C.section 360bbb-3(b)(1), unless the authorization is terminated  or revoked sooner.       Influenza A by PCR NEGATIVE NEGATIVE Final   Influenza B by PCR NEGATIVE NEGATIVE Final    Comment: (NOTE) The Xpert Xpress SARS-CoV-2/FLU/RSV plus assay is intended as an aid in the diagnosis of influenza from Nasopharyngeal swab specimens and should not be used as a sole basis for treatment. Nasal washings and aspirates are unacceptable for Xpert Xpress SARS-CoV-2/FLU/RSV testing.  Fact Sheet for Patients: BloggerCourse.com  Fact Sheet for Healthcare Providers: SeriousBroker.it  This test is not yet approved or cleared by the [place] FDA and has been authorized for detection and/or diagnosis of SARS-CoV-2 by FDA under an Emergency Use Authorization (EUA). This EUA will remain in effect (meaning this test can be used) for the duration of the COVID-19 declaration under Section 564(b)(1) of the Act, 21 U.S.C. section 360bbb-3(b)(1), unless the authorization is terminated or revoked.  Performed at [facility], [address] [place], [place] [address]      Radiological Exams on Admission: DG Chest 1 View  Result Date: 12/15/2021 CLINICAL DATA:  Cough EXAM: CHEST  1 VIEW COMPARISON:  Chest x-ray 02/11/2021 FINDINGS: The heart size and mediastinal contours are within normal limits. Both lungs are clear. The  visualized skeletal structures are unremarkable. IMPRESSION: No active disease. Electronically Signed   By: Darliss Cheney M.D.   On: 12/15/2021 16:11    EKG: Independently reviewed. SR.   Assessment/Plan   1. AKI  - Pt with hx of lupus nephritis presents after recent N/V/D and found to have SCr of 2.01, up from 1.5 in July 2023  - There is large Hgb and >300 protein on UA  - Nephrology was consulted by the ED physician and 2 liters NS was given  - Hold lisinopril, continue IVF hydration, renally-dose medications, repeat serum chemistries in am   2. Lupus  - Continue prednisone, Plaquenil, CellCept    3. HTN  - Hold lisinopril  and treat as-needed only for now    DVT prophylaxis: Lovenox  Code Status: Full  Level of Care: Level of care: Med-Surg Family Communication: None present  Disposition Plan:  Patient is from: home  Anticipated d/c is to: home  Anticipated d/c date is: 12/17/21  Patient currently: pending improved/stable renal function, nephrology consultation   Consults called: Nephrology consulted by ED  Admission status: observation     Briscoe Deutscher, MD [facility]  12/16/2021, 2:23 AM

## 2021-12-16 NOTE — Consult Note (Signed)
Renal Service Consult Note [facility]  Salam Cara 12/16/2021 Sol Blazing, MD Requesting Physician: Dr. Maylene Roes  Reason for Consult: Renal failure HPI: The patient is a 31 y.o. year-old w/ hx of depression, HTN, RA, SLE , chronic back pain who presented to ED for c/o cough for past 5 days, also URI symptoms of runny nose, also abd pain w/ vomiting and diarrhea. No fevers or chills. He talked to his lupus doctor yesterday 9/08 who suggested he go to ED to get checked out, even though his symptoms were improving. Pt presented to ED yesterday. In ED creat 2.01, CO2 26 , Na 139 , K 3.5, CO2 26, Alb 2.7, WBC 2.9, Hb 11.4.  BP's normal to high, HR 80, RR 18, afeb temp 98. On RA 100% sats. Pt admitted and given IVF's, lisinopril and voclosporin on hold. Today creat down to 1.60.  We are asked to see renal failure.   Pt seen in room. Pt became ill 6 days ago on Sunday. On Monday was really sick, started to improve Tuesday and Wed went back to work but then lost his voice and started w/ n/v/d, abd pain. On Thursday went to hospital. Friday yest n/v somewhat better and today is eating solid foods. Symptoms mostly gone.    Per [facility] office visit may 2023 - dx'd w/ SLE 2013 w/ malar rash, oralulcers, low wbc/ plts, arthalgias. In Feb 2017 proteinuria flared up to $Rem'1700mg'wtUL$ / g. Renal bx 2017 showed Class II LN w/ minimal activity/ mild to mod chronicity. They suspected early transition to more active form of SLE nephritis. His cellcept dose was ^'d and he was started on prednisone. Then lost to f/u for a few yrs. Rheum labs showed UPCR had nadired around 100-300 mg/g. In Aug 2022 the UPCR ^'d to 800 then 2300 mg/g in Dec 2022, also rising ds DNA, C3/C4 downtrending. He was started on pred taper which was down to $Remov'20mg'JawMZB$  at time of this visit. Hx prior allergic reaction to benlysta and pt didn't want infusions. He was prescribed voclosporin but hadn't started it yet in may 2023. He was taking  lisinopril 10 mg daily. Creat may 2023 was up to 1.4 (prior b/l 1.0-1.2). UPCR better at 377, dsDNR still high 573, C3 normalized and C4 still low. MMF was ^'d to 2000 mg qam and $Remov'1000mg'KoJKuQ$  qpm. He was supposed to get the voclosporin and start taking that.   ROS - denies CP, no joint pain, no HA, no blurry vision, no rash   Past Medical History  Past Medical History:  Diagnosis Date   Anxiety    Childhood asthma    Chronic back pain    Depression    Hypertension    Leukopenia 09/07/2013   Migraine    "@ least 2-3 times/wk now" (02/10/2015)   RA (rheumatoid arthritis) ([place])    "feet, fingers, knees; it's from my lupus" (02/10/2015)   Stroke-like symptoms 02/09/2015-02/10/2015   "right-left"   Systemic lupus ([place])    Thrombocytopenia, unspecified ([place]) 09/07/2013   Past Surgical History  Past Surgical History:  Procedure Laterality Date   IR THORACENTESIS ASP PLEURAL SPACE W/IMG GUIDE  12/07/2016   TONSILLECTOMY AND ADENOIDECTOMY  2007   Family History  Family History  Problem Relation Age of Onset   Lupus Neg Hx    Social History  reports that he quit smoking about 7 years ago. He started smoking about 11 years ago. He has a 6.00 pack-year smoking history. He has never  used smokeless tobacco. He reports that he does not drink alcohol and does not use drugs. Allergies  Allergies  Allergen Reactions   Phenergan [Promethazine] Nausea And Vomiting   Home medications Prior to Admission medications   Medication Sig Start Date End Date Taking? Authorizing Provider  aspirin EC 81 MG EC tablet Take 1 tablet (81 mg total) by mouth daily. 02/12/15  Yes Thurnell Lose, MD  hydroxychloroquine (PLAQUENIL) 200 MG tablet Take 2 tablets (400 mg total) by mouth daily. 12/09/16  Yes Hosie Poisson, MD  lisinopril (ZESTRIL) 10 MG tablet Take 10 mg by mouth daily. 11/09/21  Yes [provider]  mycophenolate (CELLCEPT) 500 MG tablet Take 1,500 mg by mouth 2 (two) times daily.   Yes [provider]  predniSONE (DELTASONE) 5 MG tablet Take 5-10 mg by mouth See admin instructions. 10 mg once daily for 30 days, 7.5 mg once daily for 30 days, 5 mg once daily for 60 days.   Yes [provider]  Voclosporin (LUPKYNIS) 7.9 MG CAPS Take 23.7 mg by mouth 2 (two) times daily.   Yes [provider]     Vitals:   12/16/21 0950 12/16/21 1121 12/16/21 1259 12/16/21 1442  BP: (!) 174/104 (!) 177/102 (!) 187/121 (!) 149/95  Pulse:  78 83 81  Resp:  19 18   Temp:  98.4 F (36.9 C) 98 F (36.7 C)   TempSrc:  Oral Oral   SpO2:  100% 100%   Weight:      Height:       Exam Gen alert, no distress No rash, cyanosis or gangrene Sclera anicteric, throat clear  No jvd or bruits Chest clear bilat to bases, no rales/ wheezing RRR no MRG Abd soft ntnd no mass or ascites +bs GU normal male MS no joint effusions or deformity Ext no LE or UE edema, no wounds or ulcers Neuro is alert, Ox 3 , nf    Home meds: - aspirin - lisinopril 10 qd - hydroxychloroquine 400 qd - mycophenolate 1500 bid - voclosporin 23.$RemoveBeforeDE'7mg'xcrrpAuRRcwQZbI$  bid - prednisone 7.$RemoveBefore'5mg'AHtNhtcVNjMRf$  qd   Date   Creat  eGFR  2016- 2018  0.90- 1.11     2020   1.14- 1.06 Mar 2021  1.18  > 60 ml/min    Sept 8, 2023  2.01   44    Sept 9, 2023  1.61  58    UA 9/8 - prot >300, 6-10 rbc, 0-5 wbc, 0-5 epis, few bact   Alb 2.9, LFT"s ok, K+ 3.5, CO2 26  BUN 17-20,  phos 2.8  Ca 7.4   WBC 3.3, Hb 11.0, plt 244    CXR 9/8 - IMPRESSION: No active disease.    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 B/l creat 1.2 -1.4, eGFR > 60 ml/min, from this summer. Creat here 2.01 in setting of acute  illness w/ several days of abd pain, n/v/d.  AKI due to vol depletion + acei + calcineurin inhibitor effects. ACEi and voclosporin on hold  and getting IVF's appropriately. Creat down to 1.6 today. Cold and GI symptoms seem to be mostly resolved. Eating solid foods. Suspect he will continue to recover. Cont IVF's. F/u labs in am. Will follow.  HTN - BP's high on admit here, better on new norvasc 10 mg qd. Would cont to hold acei until seen by his renal providers at [facility] (will see them in 1-2 wks per pt) and cont norvasc for BP control. Will add on prn clonidine 0.1 bid for SBP > 160 or DBP > 100.    Rob Milady Fleener  MD 12/16/2021, 3:25 PM Recent Labs  Lab 12/15/21 1616 12/16/21 0541  HGB 11.0* 11.4*  ALBUMIN 2.9* 2.7*  CALCIUM 7.4* 7.5*  PHOS  --  2.8  CREATININE 2.01* 1.61*  K 3.3* 3.5   Inpatient medications:  amLODipine  10 mg Oral Daily   aspirin EC  81 mg Oral Daily   enoxaparin (LOVENOX) injection  40 mg Subcutaneous Q24H   hydroxychloroquine  400 mg Oral Daily   mycophenolate  1,500 mg Oral BID   predniSONE  7.5 mg Oral Q breakfast    sodium chloride 100 mL/hr at 12/16/21 1353   acetaminophen **OR** acetaminophen, hydrALAZINE, labetalol, ondansetron **OR** ondansetron (ZOFRAN) IV

## 2021-12-17 LAB — HIV ANTIBODY (ROUTINE TESTING W REFLEX): HIV Screen 4th Generation wRfx: NONREACTIVE

## 2021-12-17 LAB — BASIC METABOLIC PANEL
Anion gap: 7 (ref 5–15)
BUN: 17 mg/dL (ref 6–20)
CO2: 24 mmol/L (ref 22–32)
Calcium: 7.7 mg/dL — ABNORMAL LOW (ref 8.9–10.3)
Chloride: 110 mmol/L (ref 98–111)
Creatinine, Ser: 1.65 mg/dL — ABNORMAL HIGH (ref 0.61–1.24)
GFR, Estimated: 56 mL/min — ABNORMAL LOW (ref >60)
Glucose, Bld: 96 mg/dL (ref 70–99)
Potassium: 3.2 mmol/L — ABNORMAL LOW (ref 3.5–5.1)
Sodium: 141 mmol/L (ref 135–145)

## 2021-12-17 MED ORDER — CLONIDINE HCL 0.1 MG PO TABS: 0.1000 mg | ORAL_TABLET | Freq: Two times a day (BID) | ORAL | 11 refills | Status: DC | PRN

## 2021-12-17 MED ORDER — CVS BLOOD PRESSURE MONITOR KIT: PACK | 0 refills | Status: AC

## 2021-12-17 MED ORDER — POTASSIUM CHLORIDE CRYS ER 20 MEQ PO TBCR
40.0000 meq | EXTENDED_RELEASE_TABLET | Freq: Once | ORAL | Status: AC
  Administered 2021-12-17: 40 meq via ORAL
  Filled 2021-12-17: qty 2

## 2021-12-17 MED ORDER — AMLODIPINE BESYLATE 10 MG PO TABS: 10.0000 mg | ORAL_TABLET | Freq: Every day | ORAL | 0 refills | Status: AC

## 2021-12-17 NOTE — Progress Notes (Signed)
[facility] Progress Note  Subjective: in good spirits, keeping food down w/o issues  Vitals:   12/17/21 0940 12/17/21 1037 12/17/21 1200 12/17/21 1419  BP: (!) 164/107 (!) 185/123 136/88 (!) 144/86  Pulse: 75 80 82 85  Resp: 18   18  Temp: 98.1 F (36.7 C)     TempSrc: Oral     SpO2: 100% 99%  98%  Weight:      Height:        Exam: Gen alert, no distress No jvd or bruits Chest clear bilat to bases RRR no MRG Abd soft ntnd no mass or ascites +bs Ext no LE edema Neuro is alert, Ox 3 , nf    Home meds: - aspirin - lisinopril 10 qd - hydroxychloroquine 400 qd - mycophenolate 1500 bid - voclosporin 23.33m bid - prednisone 7.566mqd               Date                             Creat               eGFR             2016- 2018                  0.90- 1.11             2020                            1.14- 1.06 Mar 2021                     1.18                 > 60 ml/min             Sept 8, 2023                2.01                 44             Sept 9, 2023                1.61                 58     UA 9/8 - prot >300, 6-10 rbc, 0-5 wbc, 0-5 epis, few bact   Alb 2.9, LFT"s ok, K+ 3.5, CO2 26  BUN 17-20,  phos 2.8  Ca 7.4   WBC 3.3, Hb 11.0, plt 244    CXR 9/8 - IMPRESSION: No active disease.    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 B/l creat 1.2 -1.4, eGFR > 60 ml/min, from this summer. Creat here 2.01 in setting of acute illness w/ several days of abd pain, n/v/d.  AKI due to vol depletion + acei +/-  calcineurin inhibitor effects. ACEi and voclosporin were held and pt rec'd IVF's appropriately. Cold and  GI symptoms have resolved. Creat is stable today at 1.6, okay for dc home. Pt has an appt w/ his [facility] renal provider on 9/19. At dc will: - resume voclosporin when you get home tonight (23.62m bid) - cont prednisone/ cellcept at usual dose - cont to hold lisinopril, but don't throw it away as it will likely be restarted later - cont norvasc 10 qd as started here, also take clonidine 0.1 bid prn for BP > 160/100 - [facility] - will sign off, please call w/ questions  HTN - as above   Rob Giavanna Kang 12/17/2021, 4:28 PM   Recent Labs  Lab 12/15/21 1616 12/16/21 0541 12/17/21 0941  HGB 11.0* 11.4*  --   ALBUMIN 2.9* 2.7*  --   CALCIUM 7.4* 7.5* 7.7*  PHOS  --  2.8  --   CREATININE 2.01* 1.61* 1.65*  K 3.3* 3.5 3.2*   No results for input(s): "IRON", "TIBC", "FERRITIN" in the last 168 hours. Inpatient medications:  amLODipine  10 mg Oral Daily   aspirin EC  81 mg Oral Daily   enoxaparin (LOVENOX) injection  40 mg Subcutaneous Q24H   hydroxychloroquine  400 mg Oral Daily   mycophenolate  1,500 mg Oral BID   predniSONE  7.5 mg Oral Q breakfast    sodium chloride 100 mL/hr at 12/17/21 0902   acetaminophen **OR** acetaminophen, cloNIDine, hydrALAZINE, labetalol, ondansetron **OR** ondansetron (ZOFRAN) IV

## 2021-12-17 NOTE — Discharge Summary (Signed)
Physician Discharge Summary  Dan Adams OIZ:[id number] DOB: August 06, 1990 DOA: 12/15/2021  PCP: Cassandria Anger, MD  Admit date: 12/15/2021 Discharge date: 12/17/2021  Admitted From: Home Disposition:  Home  Recommendations for Outpatient Follow-up:  Follow up with Dr. Candiss Norse, [facility]   Discharge Condition: Stable CODE STATUS: Full  Diet recommendation: Regular   Brief/Interim Summary: Dan Adams is a 31 y.o. male with medical history significant for lupus, lupus nephritis, and hypertension, presenting to the emergency department with recent cough, nausea, vomiting, and diarrhea.  He admits to symptoms starting 9/3.  Symptoms have slowly been getting better over the past couple of days.  In the emergency department, creatinine noted to be elevated to 2.01.  Patient was given IV fluids and admitted to the hospital with nephrology consultation. Patient's Cr improved with fluids, holding lisinopril. Due to elevated BP, he was given norvasc as well as clonidine prn.   Discharge Diagnoses:   Principal Problem:   AKI (acute kidney injury) ([place]) Active Problems:   Lupus (systemic lupus erythematosus) ([facility])   Hypertensive emergency   AKI, history of lupus nephritis -Creatinine improved with IV fluid -Nephrology consulted -Hold lisinopril   Hypertensive emergency, with AKI -Holding lisinopril due to kidney dysfunction -Norvasc, clonidine prn    Lupus -Continue Plaquenil, CellCept, prednisone -Followed by Dr. Candiss Norse at [facility] nephrology  Discharge Instructions  Discharge Instructions     Call MD for:  difficulty breathing, headache or visual disturbances   Complete by: As directed    Call MD for:  extreme fatigue   Complete by: As directed    Call MD for:  persistant dizziness or light-headedness   Complete by: As directed    Call MD for:  persistant nausea and vomiting   Complete by: As directed    Call MD for:  severe uncontrolled pain   Complete by: As  directed    Call MD for:  temperature >100.4   Complete by: As directed    Discharge instructions   Complete by: As directed    You were cared for by a hospitalist during your hospital stay. If you have any questions about your discharge medications or the care you received while you were in the hospital after you are discharged, you can call the unit and ask to speak with the hospitalist on call if the hospitalist that took care of you is not available. Once you are discharged, your primary care physician will handle any further medical issues. Please note that NO REFILLS for any discharge medications will be authorized once you are discharged, as it is imperative that you return to your primary care physician (or establish a relationship with a primary care physician if you do not have one) for your aftercare needs so that they can reassess your need for medications and monitor your lab values.   Increase activity slowly   Complete by: As directed       Allergies as of 12/17/2021       Reactions   Phenergan [promethazine] Nausea And Vomiting        Medication List     STOP taking these medications    lisinopril 10 MG tablet Commonly known as: ZESTRIL       TAKE these medications    amLODipine 10 MG tablet Commonly known as: NORVASC Take 1 tablet (10 mg total) by mouth daily. Start taking on: December 18, 2021   aspirin EC 81 MG tablet Take 1 tablet (81 mg total) by mouth daily.  cloNIDine 0.1 MG tablet Commonly known as: CATAPRES Take 1 tablet (0.1 mg total) by mouth 2 (two) times daily as needed (for SBP > 160 or DBP > 100).   [employer] Blood Pressure Monitor Kit Check blood pressure at rest, same time, daily   hydroxychloroquine 200 MG tablet Commonly known as: PLAQUENIL Take 2 tablets (400 mg total) by mouth daily.   Lupkynis 7.9 MG Caps Generic drug: Voclosporin Take 23.7 mg by mouth 2 (two) times daily.   mycophenolate 500 MG tablet Commonly known as:  CELLCEPT Take 1,500 mg by mouth 2 (two) times daily.   predniSONE 5 MG tablet Commonly known as: DELTASONE Take 5-10 mg by mouth See admin instructions. 10 mg once daily for 30 days, 7.5 mg once daily for 30 days, 5 mg once daily for 60 days.        Follow-up Information     Murlean Iba, MD Follow up.   Specialty: Nephrology Contact information: [place] [address] [phone]                Allergies  Allergen Reactions   Phenergan [Promethazine] Nausea And Vomiting    Consultations: Nephrology    Procedures/Studies: DG Chest 1 View  Result Date: 12/15/2021 CLINICAL DATA:  Cough EXAM: CHEST  1 VIEW COMPARISON:  Chest x-ray 02/11/2021 FINDINGS: The heart size and mediastinal contours are within normal limits. Both lungs are clear. The visualized skeletal structures are unremarkable. IMPRESSION: No active disease. Electronically Signed   By: Ronney Asters M.D.   On: 12/15/2021 16:11       Discharge Exam: Vitals:   12/17/21 1200 12/17/21 1419  BP: 136/88 (!) 144/86  Pulse: 82 85  Resp:  18  Temp:    SpO2:  98%    General: Pt is alert, awake, not in acute distress Cardiovascular: RRR, S1/S2 +, no edema Respiratory: CTA bilaterally, no wheezing, no rhonchi, no respiratory distress, no conversational dyspnea  Abdominal: Soft, NT, ND, bowel sounds + Extremities: no edema, no cyanosis Psych: Normal mood and affect, stable judgement and insight     The results of significant diagnostics from this hospitalization (including imaging, microbiology, ancillary and laboratory) are listed below for reference.     Microbiology: Recent Results (from the past 240 hour(s))  Resp Panel by RT-PCR (Flu A&B, Covid) Anterior Nasal Swab     Status: None   Collection Time: 12/15/21  4:16 PM   Specimen: Anterior Nasal Swab  Result Value Ref Range Status   SARS Coronavirus 2 by RT PCR NEGATIVE NEGATIVE Final    Comment:  (NOTE) SARS-CoV-2 target nucleic acids are NOT DETECTED.  The SARS-CoV-2 RNA is generally detectable in upper respiratory specimens during the acute phase of infection. The lowest concentration of SARS-CoV-2 viral copies this assay can detect is 138 copies/mL. A negative result does not preclude SARS-Cov-2 infection and should not be used as the sole basis for treatment or other patient management decisions. A negative result may occur with  improper specimen collection/handling, submission of specimen other than nasopharyngeal swab, presence of viral mutation(s) within the areas targeted by this assay, and inadequate number of viral copies(<138 copies/mL). A negative result must be combined with clinical observations, patient history, and epidemiological information. The expected result is Negative.  Fact Sheet for Patients:  EntrepreneurPulse.com.au  Fact Sheet for Healthcare Providers:  IncredibleEmployment.be  This test is no t yet approved or cleared by the [place] and  has been authorized for  detection and/or diagnosis of SARS-CoV-2 by FDA under an Emergency Use Authorization (EUA). This EUA will remain  in effect (meaning this test can be used) for the duration of the COVID-19 declaration under Section 564(b)(1) of the Act, 21 U.S.C.section 360bbb-3(b)(1), unless the authorization is terminated  or revoked sooner.       Influenza A by PCR NEGATIVE NEGATIVE Final   Influenza B by PCR NEGATIVE NEGATIVE Final    Comment: (NOTE) The Xpert Xpress SARS-CoV-2/FLU/RSV plus assay is intended as an aid in the diagnosis of influenza from Nasopharyngeal swab specimens and should not be used as a sole basis for treatment. Nasal washings and aspirates are unacceptable for Xpert Xpress SARS-CoV-2/FLU/RSV testing.  Fact Sheet for Patients: EntrepreneurPulse.com.au  Fact Sheet for Healthcare  Providers: IncredibleEmployment.be  This test is not yet approved or cleared by the [place] FDA and has been authorized for detection and/or diagnosis of SARS-CoV-2 by FDA under an Emergency Use Authorization (EUA). This EUA will remain in effect (meaning this test can be used) for the duration of the COVID-19 declaration under Section 564(b)(1) of the Act, 21 U.S.C. section 360bbb-3(b)(1), unless the authorization is terminated or revoked.  Performed at [facility], [address] [place], [place] [address]      Labs: BNP (last 3 results) No results for input(s): "BNP" in the last 8760 hours. Basic Metabolic Panel: Recent Labs  Lab 12/15/21 1616 12/16/21 0541 12/17/21 0941  NA 136 139 141  K 3.3* 3.5 3.2*  CL 103 108 110  CO2 26 26 24   GLUCOSE 119* 93 96  BUN 20 17 17   CREATININE 2.01* 1.61* 1.65*  CALCIUM 7.4* 7.5* 7.7*  MG  --  2.0  --   PHOS  --  2.8  --    Liver Function Tests: Recent Labs  Lab 12/15/21 1616 12/16/21 0541  AST 24 18  ALT 12 10  ALKPHOS 47 48  BILITOT 0.4 0.6  PROT 5.9* 5.5*  ALBUMIN 2.9* 2.7*   Recent Labs  Lab 12/15/21 1616  LIPASE 26   No results for input(s): "AMMONIA" in the last 168 hours. CBC: Recent Labs  Lab 12/15/21 1616 12/16/21 0541  WBC 3.3* 2.9*  NEUTROABS 2.1 1.8  HGB 11.0* 11.4*  HCT 32.7* 34.3*  MCV 87.2 88.6  PLT 244 218   Cardiac Enzymes: No results for input(s): "CKTOTAL", "CKMB", "CKMBINDEX", "TROPONINI" in the last 168 hours. BNP: Invalid input(s): "POCBNP" CBG: No results for input(s): "GLUCAP" in the last 168 hours. D-Dimer No results for input(s): "DDIMER" in the last 72 hours. Hgb A1c No results for input(s): "HGBA1C" in the last 72 hours. Lipid Profile No results for input(s): "CHOL", "HDL", "LDLCALC", "TRIG", "CHOLHDL", "LDLDIRECT" in the last 72 hours. Thyroid function studies No results for input(s): "TSH", "T4TOTAL", "T3FREE", "THYROIDAB" in the last  72 hours.  Invalid input(s): "FREET3" Anemia work up No results for input(s): "VITAMINB12", "FOLATE", "FERRITIN", "TIBC", "IRON", "RETICCTPCT" in the last 72 hours. Urinalysis    Component Value Date/Time   COLORURINE YELLOW 12/15/2021 1809   APPEARANCEUR CLEAR 12/15/2021 1809   LABSPEC 1.020 12/15/2021 1809   PHURINE 5.5 12/15/2021 1809   GLUCOSEU NEGATIVE 12/15/2021 1809   GLUCOSEU NEGATIVE 04/29/2015 0923   HGBUR LARGE (A) 12/15/2021 1809   BILIRUBINUR NEGATIVE 12/15/2021 1809   KETONESUR NEGATIVE 12/15/2021 1809   PROTEINUR >=300 (A) 12/15/2021 1809   UROBILINOGEN 0.2 04/29/2015 0923   NITRITE NEGATIVE 12/15/2021 1809   LEUKOCYTESUR NEGATIVE 12/15/2021 1809  Sepsis Labs Recent Labs  Lab 12/15/21 1616 12/16/21 0541  WBC 3.3* 2.9*   Microbiology Recent Results (from the past 240 hour(s))  Resp Panel by RT-PCR (Flu A&B, Covid) Anterior Nasal Swab     Status: None   Collection Time: 12/15/21  4:16 PM   Specimen: Anterior Nasal Swab  Result Value Ref Range Status   SARS Coronavirus 2 by RT PCR NEGATIVE NEGATIVE Final    Comment: (NOTE) SARS-CoV-2 target nucleic acids are NOT DETECTED.  The SARS-CoV-2 RNA is generally detectable in upper respiratory specimens during the acute phase of infection. The lowest concentration of SARS-CoV-2 viral copies this assay can detect is 138 copies/mL. A negative result does not preclude SARS-Cov-2 infection and should not be used as the sole basis for treatment or other patient management decisions. A negative result may occur with  improper specimen collection/handling, submission of specimen other than nasopharyngeal swab, presence of viral mutation(s) within the areas targeted by this assay, and inadequate number of viral copies(<138 copies/mL). A negative result must be combined with clinical observations, patient history, and epidemiological information. The expected result is Negative.  Fact Sheet for Patients:   EntrepreneurPulse.com.au  Fact Sheet for Healthcare Providers:  IncredibleEmployment.be  This test is no t yet approved or cleared by the [place] FDA and  has been authorized for detection and/or diagnosis of SARS-CoV-2 by FDA under an Emergency Use Authorization (EUA). This EUA will remain  in effect (meaning this test can be used) for the duration of the COVID-19 declaration under Section 564(b)(1) of the Act, 21 U.S.C.section 360bbb-3(b)(1), unless the authorization is terminated  or revoked sooner.       Influenza A by PCR NEGATIVE NEGATIVE Final   Influenza B by PCR NEGATIVE NEGATIVE Final    Comment: (NOTE) The Xpert Xpress SARS-CoV-2/FLU/RSV plus assay is intended as an aid in the diagnosis of influenza from Nasopharyngeal swab specimens and should not be used as a sole basis for treatment. Nasal washings and aspirates are unacceptable for Xpert Xpress SARS-CoV-2/FLU/RSV testing.  Fact Sheet for Patients: EntrepreneurPulse.com.au  Fact Sheet for Healthcare Providers: IncredibleEmployment.be  This test is not yet approved or cleared by the [place] FDA and has been authorized for detection and/or diagnosis of SARS-CoV-2 by FDA under an Emergency Use Authorization (EUA). This EUA will remain in effect (meaning this test can be used) for the duration of the COVID-19 declaration under Section 564(b)(1) of the Act, 21 U.S.C. section 360bbb-3(b)(1), unless the authorization is terminated or revoked.  Performed at [facility], [address] [place], [place] [address]      Patient was seen and examined on the day of discharge and was found to be in stable condition. Time coordinating discharge: 20 minutes including assessment and coordination of care, as well as examination of the patient.   SIGNED:  Dessa Phi, DO [facility] 12/17/2021, 4:38 PM

## 2021-12-17 NOTE — Progress Notes (Signed)
Discharge instructions given to patient and family. All questions answered. Personal belonging with patient at time of discharge, Patient in stable condition at time of discharge.

## 2021-12-17 NOTE — TOC CM/SW Note (Signed)
  Transition of Care [facility]) Screening Note   Patient Details  Name: Dan Adams Date of Birth: February 23, 1991   Transition of Care [facility]) CM/SW Contact:    Darleene Cleaver, LCSW Phone Number: 12/17/2021, 5:24 PM    Transition of Care Department [facility]) has reviewed patient and no TOC needs have been identified at this time. We will continue to monitor patient advancement through interdisciplinary progression rounds. If new patient transition needs arise, please place a TOC consult.

## 2021-12-31 ENCOUNTER — Emergency Department (HOSPITAL_COMMUNITY)
Admission: EM | Admit: 2021-12-31 | Discharge: 2021-12-31 | Disposition: A | Discharge status: Home / Self Care | Payer: Medicaid Other | Attending: Emergency Medicine | Admitting: Emergency Medicine

## 2021-12-31 DIAGNOSIS — Z7982 Long term (current) use of aspirin: Secondary | ICD-10-CM | POA: Insufficient documentation

## 2021-12-31 DIAGNOSIS — Z79899 Other long term (current) drug therapy: Secondary | ICD-10-CM | POA: Insufficient documentation

## 2021-12-31 DIAGNOSIS — R799 Abnormal finding of blood chemistry, unspecified: Secondary | ICD-10-CM | POA: Insufficient documentation

## 2021-12-31 DIAGNOSIS — D72829 Elevated white blood cell count, unspecified: Secondary | ICD-10-CM | POA: Insufficient documentation

## 2021-12-31 DIAGNOSIS — I150 Renovascular hypertension: Secondary | ICD-10-CM | POA: Insufficient documentation

## 2021-12-31 DIAGNOSIS — R42 Dizziness and giddiness: Secondary | ICD-10-CM

## 2021-12-31 DIAGNOSIS — M3214 Glomerular disease in systemic lupus erythematosus: Secondary | ICD-10-CM | POA: Insufficient documentation

## 2021-12-31 LAB — CBC
HCT: 40.6 % (ref 39.0–52.0)
Hemoglobin: 13.5 g/dL (ref 13.0–17.0)
MCH: 29.5 pg (ref 26.0–34.0)
MCHC: 33.3 g/dL (ref 30.0–36.0)
MCV: 88.6 fL (ref 80.0–100.0)
Platelets: 330 10*3/uL (ref 150–400)
RBC: 4.58 MIL/uL (ref 4.22–5.81)
RDW: 11.9 % (ref 11.5–15.5)
WBC: 18.1 10*3/uL — ABNORMAL HIGH (ref 4.0–10.5)
nRBC: 0 % (ref 0.0–0.2)

## 2021-12-31 LAB — BASIC METABOLIC PANEL
Anion gap: 8 (ref 5–15)
BUN: 33 mg/dL — ABNORMAL HIGH (ref 6–20)
CO2: 26 mmol/L (ref 22–32)
Calcium: 8.7 mg/dL — ABNORMAL LOW (ref 8.9–10.3)
Chloride: 104 mmol/L (ref 98–111)
Creatinine, Ser: 1.49 mg/dL — ABNORMAL HIGH (ref 0.61–1.24)
GFR, Estimated: >60 mL/min (ref >60)
Glucose, Bld: 115 mg/dL — ABNORMAL HIGH (ref 70–99)
Potassium: 4.1 mmol/L (ref 3.5–5.1)
Sodium: 138 mmol/L (ref 135–145)

## 2021-12-31 LAB — URINALYSIS, ROUTINE W REFLEX MICROSCOPIC
Bilirubin Urine: NEGATIVE
Glucose, UA: NEGATIVE mg/dL
Ketones, ur: NEGATIVE mg/dL
Nitrite: NEGATIVE
Protein, ur: >=300 mg/dL — AB
Specific Gravity, Urine: 1.011 (ref 1.005–1.030)
pH: 5 (ref 5.0–8.0)

## 2021-12-31 NOTE — Discharge Instructions (Signed)
You were seen in the emergency department after feeling dizzy lightheaded.  Your blood pressure was elevated but your lab work including your renal function looked improved.  Please continue your regular medications and keep a log of your blood pressures.  Follow-up with your kidney specialist as scheduled.  Return to the emergency department if any worsening or concerning symptoms.

## 2021-12-31 NOTE — ED Provider Triage Note (Signed)
Emergency Medicine Provider Triage Evaluation Note  Dan Adams , a 31 y.o. male  was evaluated in triage.  Pt complains of lightheadedness.  Patient states that he felt lightheaded on his way to work when he was driving.  He states he usually takes his at home medicines with a full meal as instructed by his primary care and lupus doctors.  He states that he had "a bite of yogurt" and took all of his medicine at one time and this episode happened not long after taking the medicine.  He states that he feels back to baseline currently.  Denies fever, chills, night sweats, chest pain, shortness of breath, abdominal pain, nausea, vomiting, urinary symptoms, change in bowel habits.  Patient is secondarily concerned about systolic blood pressure reading of 170 yesterday before taking his nightly meds.  Review of Systems  Positive: See above Negative:   Physical Exam  BP (!) 159/96 (BP Location: Left Arm)   Pulse 76   Temp 98.5 F (36.9 C) (Oral)   Resp 18   SpO2 100%  Gen:   Awake, no distress   Resp:  Normal effort  MSK:   Moves extremities without difficulty  Other:    312 grossly intact.  Medical Decision Making  Medically screening exam initiated at 9:16 AM.  Appropriate orders placed.  Dan Adams was informed that the remainder of the evaluation will be completed by another provider, this initial triage assessment does not replace that evaluation, and the importance of remaining in the ED until their evaluation is complete.     Wilnette Kales, [place] 12/31/21 [phone]

## 2021-12-31 NOTE — ED Provider Notes (Signed)
[facility] DEPT Provider Note   CSN: [id number] Arrival date & time: 12/31/21  7564     History  Chief Complaint  Patient presents with   Dizziness    Dan Adams is a 31 y.o. male.  He has a history of lupus nephritis.  He was recently admitted here for dehydration and AKI and he said since discharge he was readmitted to [facility] and had his medications adjusted and he is on a new medication for his lupus.  His creatinine had gone up to 4 but has improved.  He said he had acute dizziness lightheadedness today and his blood pressure was up.  He had to lay down on the floor.  EMS found him hypertensive and transported him here.  He said the dizziness is mostly resolved and he is feeling better now.  He is concerned if his renal function is worsened.  He denies any recent nausea vomiting diarrhea and feels he has been eating and drinking well.  He did say his blood pressures medications have been adjusted and they have been up and down since then.  No headache blurry vision double vision numbness or focal weakness.  The history is provided by the patient.  Dizziness Quality:  Lightheadedness Onset quality:  Gradual Duration:  1 hour Timing:  Intermittent Progression:  Improving Chronicity:  Recurrent Context: physical activity and standing up   Relieved by:  Lying down Worsened by:  Standing up Ineffective treatments:  None tried Associated symptoms: no chest pain, no diarrhea, no headaches, no nausea, no shortness of breath, no syncope, no vision changes, no vomiting and no weakness   Risk factors: multiple medications and new medications        Home Medications Prior to Admission medications   Medication Sig Start Date End Date Taking? Authorizing Provider  amLODipine (NORVASC) 10 MG tablet Take 1 tablet (10 mg total) by mouth daily. 12/18/21   Dessa Phi, DO  aspirin EC 81 MG EC tablet Take 1 tablet (81 mg total) by mouth daily. 02/12/15    Thurnell Lose, MD  Blood Pressure Monitoring ([employer] BLOOD PRESSURE MONITOR) KIT Check blood pressure at rest, same time, daily 12/17/21   Dessa Phi, DO  cloNIDine (CATAPRES) 0.1 MG tablet Take 1 tablet (0.1 mg total) by mouth 2 (two) times daily as needed (for SBP > 160 or DBP > 100). 12/17/21   Dessa Phi, DO  hydroxychloroquine (PLAQUENIL) 200 MG tablet Take 2 tablets (400 mg total) by mouth daily. 12/09/16   Hosie Poisson, MD  mycophenolate (CELLCEPT) 500 MG tablet Take 1,500 mg by mouth 2 (two) times daily.    [provider]  predniSONE (DELTASONE) 5 MG tablet Take 5-10 mg by mouth See admin instructions. 10 mg once daily for 30 days, 7.5 mg once daily for 30 days, 5 mg once daily for 60 days.    [provider]  Voclosporin (LUPKYNIS) 7.9 MG CAPS Take 23.7 mg by mouth 2 (two) times daily.    [provider]      Allergies    Phenergan [promethazine]    Review of Systems   Review of Systems  Constitutional:  Negative for fever.  Eyes:  Negative for visual disturbance.  Respiratory:  Negative for shortness of breath.   Cardiovascular:  Negative for chest pain and syncope.  Gastrointestinal:  Negative for diarrhea, nausea and vomiting.  Neurological:  Positive for dizziness and light-headedness. Negative for weakness and headaches.    Physical Exam Updated  Vital Signs BP (!) 152/110   Pulse 78   Temp 98.3 F (36.8 C) (Oral)   Resp 20   Ht 5' 5"  (1.651 m)   Wt 102.1 kg   SpO2 100%   BMI 37.44 kg/m  Physical Exam Vitals and nursing note reviewed.  Constitutional:      General: He is not in acute distress.    Appearance: Normal appearance. He is well-developed.  HENT:     Head: Normocephalic and atraumatic.  Eyes:     Conjunctiva/sclera: Conjunctivae normal.  Cardiovascular:     Rate and Rhythm: Normal rate and regular rhythm.     Heart sounds: No murmur heard. Pulmonary:     Effort: Pulmonary effort is normal. No respiratory  distress.     Breath sounds: Normal breath sounds.  Abdominal:     Palpations: Abdomen is soft.     Tenderness: There is no abdominal tenderness. There is no guarding or rebound.  Musculoskeletal:        General: Normal range of motion.     Cervical back: Neck supple.     Right lower leg: No edema.     Left lower leg: No edema.  Skin:    General: Skin is warm and dry.     Capillary Refill: Capillary refill takes less than 2 seconds.  Neurological:     General: No focal deficit present.     Mental Status: He is alert.     Sensory: No sensory deficit.     Motor: No weakness.     Gait: Gait normal.     ED Results / Procedures / Treatments   Labs (all labs ordered are listed, but only abnormal results are displayed) Labs Reviewed  BASIC METABOLIC PANEL - Abnormal; Notable for the following components:      Result Value   Glucose, Bld 115 (*)    BUN 33 (*)    Creatinine, Ser 1.49 (*)    Calcium 8.7 (*)    All other components within normal limits  CBC - Abnormal; Notable for the following components:   WBC 18.1 (*)    All other components within normal limits  URINALYSIS, ROUTINE W REFLEX MICROSCOPIC - Abnormal; Notable for the following components:   Hgb urine dipstick LARGE (*)    Protein, ur >=300 (*)    Leukocytes,Ua TRACE (*)    Bacteria, UA RARE (*)    All other components within normal limits  URINE CULTURE    EKG EKG Interpretation  Date/Time:  Sunday December 31 2021 09:07:10 EDT Ventricular Rate:  73 PR Interval:  120 QRS Duration: 94 QT Interval:  390 QTC Calculation: 430 R Axis:   27 Text Interpretation: Sinus rhythm Probable left atrial enlargement Borderline repolarization abnormality Nonspecific T wave inversions in the inferior and lateral leads are similar to his previous EKG and tracing Confirmed by Georgina Snell [phone]) on 12/31/2021 9:26:56 AM  Radiology No results found.  Procedures Procedures    Medications Ordered in  ED Medications - No data to display  ED Course/ Medical Decision Making/ A&P Clinical Course as of 01/01/22 1028  Sun Dec 31, 2021  1538 Patient's labs showing elevated white count although he tells me he is on steroids.  Creatinine 1.49 which is better than his recent's.  Urine with proteinuria but blood cells white blood cells sent for culture.  Not having any urine symptoms and history of lupus nephritis [MB]  1539 Blood pressure elevated here.  Will need to be  trended. [MB]  2774 Patient states he has been ambulating in the department and not feeling dizzy now.  Blood pressure is up although not critical and he said that this has been an issue since the new medications.  I recommended that he keep a log of his blood pressures when he sees his treatment team they can make a decision whether to adjust anything.  He is comfortable plan for discharge.  Return instructions discussed [MB]    Clinical Course User Index [MB] Hayden Rasmussen, MD                           Medical Decision Making Amount and/or Complexity of Data Reviewed Labs: ordered.   This patient complains of dizziness elevated blood pressure; this involves an extensive number of treatment Options and is a complaint that carries with it a high risk of complications and morbidity. The differential includes hypertension, hypertensive emergency, vertigo, stroke, bleed, dehydration, metabolic derangement  I ordered, reviewed and interpreted labs, which included CBC with elevated white count although he is recently on steroids, normal hemoglobin, chemistries with elevated BUN and creatinine better than priors, urinalysis with proteinuria red blood cells white blood cells similar to priors Additional history obtained from patient's mother Previous records obtained and reviewed in epic including recent admission Cardiac monitoring reviewed, normal sinus rhythm Social determinants considered, no significant barriers Critical  Interventions: None  After the interventions stated above, I reevaluated the patient and found patient symptomatically improved without intervention Admission and further testing considered, no indications for admission or further work-up at this time.  Counseled patient to continue his current medications and follow-up with his renal team.  Return instructions discussed         Final Clinical Impression(s) / ED Diagnoses Final diagnoses:  Dizziness  Renovascular hypertension  Lupus nephritis ([place])    Rx / DC Orders ED Discharge Orders     None         Hayden Rasmussen, MD 01/01/22 1030

## 2021-12-31 NOTE — ED Triage Notes (Signed)
Pt arrived via EMS, from [facility]. Was lying on floor on EMS arrival, pt states he was dizzy and needed to lay down. States recent medication changes, increase in prednisone.

## 2022-01-01 LAB — URINE CULTURE: Culture: NO GROWTH

## 2022-01-30 ENCOUNTER — Other Ambulatory Visit: Payer: Self-pay

## 2022-01-30 ENCOUNTER — Encounter (HOSPITAL_COMMUNITY): Payer: Self-pay | Admitting: Emergency Medicine

## 2022-01-30 ENCOUNTER — Emergency Department (HOSPITAL_COMMUNITY)
Admission: EM | Admit: 2022-01-30 | Discharge: 2022-01-31 | Disposition: A | Discharge status: Home / Self Care | Payer: Medicaid Other | Attending: Emergency Medicine | Admitting: Emergency Medicine

## 2022-01-30 DIAGNOSIS — K625 Hemorrhage of anus and rectum: Secondary | ICD-10-CM | POA: Insufficient documentation

## 2022-01-30 DIAGNOSIS — N179 Acute kidney failure, unspecified: Secondary | ICD-10-CM | POA: Insufficient documentation

## 2022-01-30 DIAGNOSIS — R1032 Left lower quadrant pain: Secondary | ICD-10-CM | POA: Insufficient documentation

## 2022-01-30 DIAGNOSIS — Z7982 Long term (current) use of aspirin: Secondary | ICD-10-CM | POA: Insufficient documentation

## 2022-01-30 LAB — CBC WITH DIFFERENTIAL/PLATELET
Abs Immature Granulocytes: 0.09 10*3/uL — ABNORMAL HIGH (ref 0.00–0.07)
Basophils Absolute: 0 10*3/uL (ref 0.0–0.1)
Basophils Relative: 0 %
Eosinophils Absolute: 0 10*3/uL (ref 0.0–0.5)
Eosinophils Relative: 0 %
HCT: 38.7 % — ABNORMAL LOW (ref 39.0–52.0)
Hemoglobin: 13.2 g/dL (ref 13.0–17.0)
Immature Granulocytes: 1 %
Lymphocytes Relative: 10 %
Lymphs Abs: 0.9 10*3/uL (ref 0.7–4.0)
MCH: 30.6 pg (ref 26.0–34.0)
MCHC: 34.1 g/dL (ref 30.0–36.0)
MCV: 89.6 fL (ref 80.0–100.0)
Monocytes Absolute: 0.3 10*3/uL (ref 0.1–1.0)
Monocytes Relative: 3 %
Neutro Abs: 7.6 10*3/uL (ref 1.7–7.7)
Neutrophils Relative %: 86 %
Platelets: 216 10*3/uL (ref 150–400)
RBC: 4.32 MIL/uL (ref 4.22–5.81)
RDW: 12.6 % (ref 11.5–15.5)
WBC: 8.9 10*3/uL (ref 4.0–10.5)
nRBC: 0 % (ref 0.0–0.2)

## 2022-01-30 LAB — URINALYSIS, ROUTINE W REFLEX MICROSCOPIC
Bacteria, UA: NONE SEEN
Bilirubin Urine: NEGATIVE
Glucose, UA: NEGATIVE mg/dL
Ketones, ur: NEGATIVE mg/dL
Leukocytes,Ua: NEGATIVE
Nitrite: NEGATIVE
Protein, ur: 100 mg/dL — AB
Specific Gravity, Urine: 1.013 (ref 1.005–1.030)
pH: 5 (ref 5.0–8.0)

## 2022-01-30 LAB — COMPREHENSIVE METABOLIC PANEL
ALT: 26 U/L (ref 0–44)
AST: 22 U/L (ref 15–41)
Albumin: 3.2 g/dL — ABNORMAL LOW (ref 3.5–5.0)
Alkaline Phosphatase: 28 U/L — ABNORMAL LOW (ref 38–126)
Anion gap: 9 (ref 5–15)
BUN: 40 mg/dL — ABNORMAL HIGH (ref 6–20)
CO2: 25 mmol/L (ref 22–32)
Calcium: 9 mg/dL (ref 8.9–10.3)
Chloride: 103 mmol/L (ref 98–111)
Creatinine, Ser: 2.08 mg/dL — ABNORMAL HIGH (ref 0.61–1.24)
GFR, Estimated: 43 mL/min — ABNORMAL LOW (ref >60)
Glucose, Bld: 124 mg/dL — ABNORMAL HIGH (ref 70–99)
Potassium: 4.7 mmol/L (ref 3.5–5.1)
Sodium: 137 mmol/L (ref 135–145)
Total Bilirubin: 0.7 mg/dL (ref 0.3–1.2)
Total Protein: 5.2 g/dL — ABNORMAL LOW (ref 6.5–8.1)

## 2022-01-30 LAB — TYPE AND SCREEN
ABO/RH(D): A POS
Antibody Screen: NEGATIVE

## 2022-01-30 LAB — LIPASE, BLOOD: Lipase: 49 U/L (ref 11–51)

## 2022-01-30 NOTE — ED Provider Triage Note (Signed)
Emergency Medicine Provider Triage Evaluation Note  Dan Adams , a 31 y.o. male  was evaluated in triage.  Pt complains of 1 episode of rectal bleeding earlier today.  He admits to intermittent left lower abdominal pain which has completely resolved.  History of lupus.  No previous GI bleed.  Denies history of hemorrhoids.  Review of Systems  Positive: Rectal bleeding Negative: fever  Physical Exam  BP (!) 151/99 (BP Location: Right Arm)   Pulse 78   Temp 98.2 F (36.8 C) (Oral)   Resp 18   SpO2 100%  Gen:   Awake, no distress   Resp:  Normal effort  MSK:   Moves extremities without difficulty  Other:  Abdomen soft, nondistended, nontender to palpation in all quadrants without guarding or peritoneal signs. No rebound.    Medical Decision Making  Medically screening exam initiated at 12:59 PM.  Appropriate orders placed.  Bartow Pardini was informed that the remainder of the evaluation will be completed by another provider, this initial triage assessment does not replace that evaluation, and the importance of remaining in the ED until their evaluation is complete.  Labs Type and screen Abdomen non-tender in triage   Suzy Bouchard, PA-C 01/30/22 1300

## 2022-01-30 NOTE — ED Triage Notes (Signed)
Pt states he had one episode bright red blood in his stool today- reports maybe 1-2 tsp. Pt had some left lower abd cramping but has stopped. Pt has hx of lupus. Denies n/v

## 2022-01-31 ENCOUNTER — Emergency Department (HOSPITAL_COMMUNITY): Payer: Medicaid Other

## 2022-01-31 MED ORDER — SODIUM CHLORIDE 0.9 % IV BOLUS
1000.0000 mL | Freq: Once | INTRAVENOUS | Status: AC
  Administered 2022-01-31: 1000 mL via INTRAVENOUS

## 2022-01-31 MED ORDER — HYDROCORTISONE ACETATE 25 MG RE SUPP: 25.0000 mg | Freq: Two times a day (BID) | RECTAL | 0 refills | Status: DC

## 2022-01-31 NOTE — ED Provider Notes (Signed)
[facility] EMERGENCY DEPARTMENT Provider Note   CSN: [id number] Arrival date & time: 01/30/22  1152     History  Chief Complaint  Patient presents with   Rectal Bleeding    Dan Adams is a 31 y.o. male.  Patient presents to the emergency department with concerns over rectal bleeding.  Patient had a single episode of bright red blood per rectum earlier today.  He estimates a couple of teaspoons of blood.  He also had some cramping pain in the left lower quadrant that has resolved.  He reports that it was not present very long.  No fever, nausea, vomiting, diarrhea.       Home Medications Prior to Admission medications   Medication Sig Start Date End Date Taking? Authorizing Provider  amLODipine (NORVASC) 10 MG tablet Take 1 tablet (10 mg total) by mouth daily. 12/18/21   Dessa Phi, DO  aspirin EC 81 MG EC tablet Take 1 tablet (81 mg total) by mouth daily. 02/12/15   Thurnell Lose, MD  Blood Pressure Monitoring ([employer] BLOOD PRESSURE MONITOR) KIT Check blood pressure at rest, same time, daily 12/17/21   Dessa Phi, DO  cloNIDine (CATAPRES) 0.1 MG tablet Take 1 tablet (0.1 mg total) by mouth 2 (two) times daily as needed (for SBP > 160 or DBP > 100). 12/17/21   Dessa Phi, DO  hydroxychloroquine (PLAQUENIL) 200 MG tablet Take 2 tablets (400 mg total) by mouth daily. 12/09/16   Hosie Poisson, MD  mycophenolate (CELLCEPT) 500 MG tablet Take 1,500 mg by mouth 2 (two) times daily.    [provider]  predniSONE (DELTASONE) 5 MG tablet Take 5-10 mg by mouth See admin instructions. 10 mg once daily for 30 days, 7.5 mg once daily for 30 days, 5 mg once daily for 60 days.    [provider]  Voclosporin (LUPKYNIS) 7.9 MG CAPS Take 23.7 mg by mouth 2 (two) times daily.    [provider]      Allergies    Phenergan [promethazine]    Review of Systems   Review of Systems  Physical Exam Updated Vital Signs BP (!) 145/95    Pulse 72   Temp 97.6 F (36.4 C) (Oral)   Resp 16   SpO2 100%  Physical Exam Vitals and nursing note reviewed.  Constitutional:      General: He is not in acute distress.    Appearance: He is well-developed.  HENT:     Head: Normocephalic and atraumatic.     Mouth/Throat:     Mouth: Mucous membranes are moist.  Eyes:     General: Vision grossly intact. Gaze aligned appropriately.     Extraocular Movements: Extraocular movements intact.     Conjunctiva/sclera: Conjunctivae normal.  Cardiovascular:     Rate and Rhythm: Normal rate and regular rhythm.     Pulses: Normal pulses.     Heart sounds: Normal heart sounds, S1 normal and S2 normal. No murmur heard.    No friction rub. No gallop.  Pulmonary:     Effort: Pulmonary effort is normal. No respiratory distress.     Breath sounds: Normal breath sounds.  Abdominal:     Palpations: Abdomen is soft.     Tenderness: There is no abdominal tenderness. There is no guarding or rebound.     Hernia: No hernia is present.  Musculoskeletal:        General: No swelling.     Cervical back: Full passive range of motion  without pain, normal range of motion and neck supple. No pain with movement, spinous process tenderness or muscular tenderness. Normal range of motion.     Right lower leg: No edema.     Left lower leg: No edema.  Skin:    General: Skin is warm and dry.     Capillary Refill: Capillary refill takes less than 2 seconds.     Findings: No ecchymosis, erythema, lesion or wound.  Neurological:     Mental Status: He is alert and oriented to person, place, and time.     GCS: GCS eye subscore is 4. GCS verbal subscore is 5. GCS motor subscore is 6.     Cranial Nerves: Cranial nerves 2-12 are intact.     Sensory: Sensation is intact.     Motor: Motor function is intact. No weakness or abnormal muscle tone.     Coordination: Coordination is intact.  Psychiatric:        Mood and Affect: Mood normal.        Speech: Speech normal.         Behavior: Behavior normal.     ED Results / Procedures / Treatments   Labs (all labs ordered are listed, but only abnormal results are displayed) Labs Reviewed  CBC WITH DIFFERENTIAL/PLATELET - Abnormal; Notable for the following components:      Result Value   HCT 38.7 (*)    Abs Immature Granulocytes 0.09 (*)    All other components within normal limits  COMPREHENSIVE METABOLIC PANEL - Abnormal; Notable for the following components:   Glucose, Bld 124 (*)    BUN 40 (*)    Creatinine, Ser 2.08 (*)    Total Protein 5.2 (*)    Albumin 3.2 (*)    Alkaline Phosphatase 28 (*)    GFR, Estimated 43 (*)    All other components within normal limits  URINALYSIS, ROUTINE W REFLEX MICROSCOPIC - Abnormal; Notable for the following components:   Hgb urine dipstick MODERATE (*)    Protein, ur 100 (*)    All other components within normal limits  LIPASE, BLOOD  POC OCCULT BLOOD, ED  TYPE AND SCREEN  ABO/RH    EKG None  Radiology CT ABDOMEN PELVIS WO CONTRAST  Result Date: 01/31/2022 CLINICAL DATA:  Left lower quadrant pain EXAM: CT ABDOMEN AND PELVIS WITHOUT CONTRAST TECHNIQUE: Multidetector CT imaging of the abdomen and pelvis was performed following the standard protocol without IV contrast. RADIATION DOSE REDUCTION: This exam was performed according to the departmental dose-optimization program which includes automated exposure control, adjustment of the mA and/or kV according to patient size and/or use of iterative reconstruction technique. COMPARISON:  CT AP 05/06/2025 FINDINGS: Lower chest: No acute abnormality. Hepatobiliary: No focal liver abnormality is seen. No gallstones, gallbladder wall thickening, or biliary dilatation. Pancreas: Unremarkable. No pancreatic ductal dilatation or surrounding inflammatory changes. Spleen: Normal in size without focal abnormality. Adrenals/Urinary Tract: Adrenal glands are unremarkable. Kidneys are normal, without renal calculi, focal lesion,  or hydronephrosis. Bladder is unremarkable. Stomach/Bowel: Stomach is within normal limits. Appendix appears normal. No evidence of bowel wall thickening, distention, or inflammatory changes. Vascular/Lymphatic: No significant vascular findings are present. No enlarged abdominal or pelvic lymph nodes. Reproductive: Prostate is unremarkable. Other: No free fluid or fluid collections. Musculoskeletal: No acute or significant osseous findings. IMPRESSION: No acute findings within the abdomen or pelvis. Electronically Signed   By: Kerby Moors M.D.   On: 01/31/2022 05:34    Procedures Procedures  Medications Ordered in ED Medications  sodium chloride 0.9 % bolus 1,000 mL (1,000 mLs Intravenous New Bag/Given 01/31/22 0431)    ED Course/ Medical Decision Making/ A&P                           Medical Decision Making Amount and/or Complexity of Data Reviewed Radiology: ordered.   Presents to the emergency department for evaluation of abdominal pain with rectal bleeding.  Differential diagnosis is broad and includes colitis, diverticulitis, proctitis, diverticular bleed, hemorrhoids.  Hemoglobin is stable.  CT abdomen and pelvis performed to further evaluate.  Patient found to have slight elevation of his creatinine.  He reports that his baseline is around 1.5.  He was recently hospitalized at [facility] for a hemoglobin of 4.  He has lupus nephritis.  He has been recently started on a new infusion to treat his lupus.  He feels like he might be dehydrated.  BUN is proportionately elevated.  We will give IV fluids.  I do not feel he requires hospitalization for this as he does have follow-up in 2 days for additional transfusion.  This can be retested and addressed by his nephrologist at that time.        Final Clinical Impression(s) / ED Diagnoses Final diagnoses:  Rectal bleeding  AKI (acute kidney injury) [facility])    Rx / DC Orders ED Discharge Orders     None         Agustin Swatek,  Gwenyth Allegra, MD 01/31/22 [phone]

## 2022-01-31 NOTE — ED Notes (Signed)
Patient verbalizes understanding of d/c instructions. Opportunities for questions and answers were provided. Pt d/c from ED and ambulated to lobby.  

## 2022-01-31 NOTE — Discharge Instructions (Signed)
Please call your nephrologist today and inform him/her of your BUN and creatinine numbers.

## 2022-04-24 ENCOUNTER — Other Ambulatory Visit: Payer: Self-pay

## 2022-04-24 ENCOUNTER — Emergency Department (HOSPITAL_COMMUNITY)
Admission: EM | Admit: 2022-04-24 | Discharge: 2022-04-24 | Disposition: A | Discharge status: Home / Self Care | Payer: Medicaid Other | Attending: Emergency Medicine | Admitting: Emergency Medicine

## 2022-04-24 ENCOUNTER — Encounter (HOSPITAL_COMMUNITY): Payer: Self-pay

## 2022-04-24 DIAGNOSIS — Z7982 Long term (current) use of aspirin: Secondary | ICD-10-CM | POA: Diagnosis not present

## 2022-04-24 DIAGNOSIS — Z1152 Encounter for screening for COVID-19: Secondary | ICD-10-CM | POA: Insufficient documentation

## 2022-04-24 DIAGNOSIS — J069 Acute upper respiratory infection, unspecified: Secondary | ICD-10-CM | POA: Diagnosis not present

## 2022-04-24 DIAGNOSIS — R0981 Nasal congestion: Secondary | ICD-10-CM | POA: Diagnosis present

## 2022-04-24 LAB — RESP PANEL BY RT-PCR (RSV, FLU A&B, COVID)  RVPGX2
Influenza A by PCR: NEGATIVE
Influenza B by PCR: NEGATIVE
Resp Syncytial Virus by PCR: NEGATIVE
SARS Coronavirus 2 by RT PCR: NEGATIVE

## 2022-04-24 MED ORDER — ACETAMINOPHEN 325 MG PO TABS
650.0000 mg | ORAL_TABLET | Freq: Once | ORAL | Status: AC
  Administered 2022-04-24: 650 mg via ORAL
  Filled 2022-04-24: qty 2

## 2022-04-24 MED ORDER — AZITHROMYCIN 250 MG PO TABS: 250.0000 mg | ORAL_TABLET | Freq: Every day | ORAL | 0 refills | Status: DC

## 2022-04-24 NOTE — ED Triage Notes (Signed)
C/o runny nose, non productive cough, diarrhea x6 days Pt reports sick family in house with same s/sy Hx lupus

## 2022-04-24 NOTE — ED Provider Notes (Signed)
[name] DEPT Provider Note   CSN: [id number] Arrival date & time: 04/24/22  1550     History  Chief Complaint  Patient presents with   Nasal Congestion    Dan Adams is a 32 y.o. male presenting to the ED with 6 days of nasal congestion, runny nose, cough.  Cough described as productive.  Recent sick contacts at home.  Hx of SLE, currently on Plaquenil.  Denies chest pain, shortness of breath, chest discomfort, or difficulty swallowing.  Endorses some nausea with some diarrhea, though denies vomiting or abdominal pain.  Unsure of fever, denies chills.  Called rheumatologist of [facility] earlier today regarding same symptoms, was recommended for further evaluation per patient.  Per patient and notes review, recent URI symptoms in November/December.  Patient states symptoms seem identical.  The history is provided by the patient and medical records.      Home Medications Prior to Admission medications   Medication Sig Start Date End Date Taking? Authorizing Provider  azithromycin (ZITHROMAX) 250 MG tablet Take 1 tablet (250 mg total) by mouth daily. Take first 2 tablets together, then 1 every day until finished. 04/24/22  Yes Prince Rome, PA-C  amLODipine (NORVASC) 10 MG tablet Take 1 tablet (10 mg total) by mouth daily. 12/18/21   Dessa Phi, DO  aspirin EC 81 MG EC tablet Take 1 tablet (81 mg total) by mouth daily. 02/12/15   Thurnell Lose, MD  Blood Pressure Monitoring ([employer] BLOOD PRESSURE MONITOR) KIT Check blood pressure at rest, same time, daily 12/17/21   Dessa Phi, DO  cloNIDine (CATAPRES) 0.1 MG tablet Take 1 tablet (0.1 mg total) by mouth 2 (two) times daily as needed (for SBP > 160 or DBP > 100). 12/17/21   Dessa Phi, DO  hydrocortisone (ANUSOL-HC) 25 MG suppository Place 1 suppository (25 mg total) rectally 2 (two) times daily. For 7 days 01/31/22   Orpah Greek, MD  hydroxychloroquine (PLAQUENIL)  200 MG tablet Take 2 tablets (400 mg total) by mouth daily. 12/09/16   Hosie Poisson, MD  mycophenolate (CELLCEPT) 500 MG tablet Take 1,500 mg by mouth 2 (two) times daily.    [provider]  predniSONE (DELTASONE) 5 MG tablet Take 5-10 mg by mouth See admin instructions. 10 mg once daily for 30 days, 7.5 mg once daily for 30 days, 5 mg once daily for 60 days.    [provider]  Voclosporin (LUPKYNIS) 7.9 MG CAPS Take 23.7 mg by mouth 2 (two) times daily.    [provider]      Allergies    Phenergan [promethazine]    Review of Systems   Review of Systems  HENT:  Positive for congestion, postnasal drip, rhinorrhea and sore throat.   Respiratory:  Positive for cough.     Physical Exam Updated Vital Signs BP 117/82 (BP Location: Right Arm)   Pulse 88   Temp 100 F (37.8 C) (Oral)   Resp 15   Wt 102 kg   SpO2 97%   BMI 37.42 kg/m  Physical Exam Vitals and nursing note reviewed.  Constitutional:      General: He is not in acute distress.    Appearance: He is well-developed. He is not ill-appearing, toxic-appearing or diaphoretic.  HENT:     Head: Normocephalic and atraumatic.     Right Ear: External ear normal.     Left Ear: External ear normal.     Nose: Congestion and rhinorrhea present.  Comments: Airway patent.  Mild erythema posterior oropharynx.  Without tonsillar exudate, swelling, or erythema.  Uvula midline, without swelling.  Handling secretions, no drooling.    Mouth/Throat:     Mouth: Mucous membranes are moist.     Pharynx: Oropharynx is clear. No oropharyngeal exudate or posterior oropharyngeal erythema.  Eyes:     Conjunctiva/sclera: Conjunctivae normal.  Neck:     Comments: No meningismus or torticollis Cardiovascular:     Rate and Rhythm: Normal rate and regular rhythm.     Pulses: Normal pulses.     Heart sounds: Normal heart sounds. No murmur heard. Pulmonary:     Effort: Pulmonary effort is normal. No respiratory  distress.     Breath sounds: Normal breath sounds. No stridor. No wheezing, rhonchi or rales.     Comments: CTAB, equal chest rise, able to communicate without difficulty, without increased respiratory effort.  Mild cough appreciated.  Chest non-TTP. Chest:     Chest wall: No tenderness.  Abdominal:     General: There is no distension.     Palpations: Abdomen is soft.     Tenderness: There is no abdominal tenderness. There is no guarding.  Musculoskeletal:        General: No swelling.     Cervical back: Neck supple. No rigidity.  Lymphadenopathy:     Cervical: Cervical adenopathy (Mild) present.  Skin:    General: Skin is warm and dry.     Capillary Refill: Capillary refill takes less than 2 seconds.     Coloration: Skin is not jaundiced or pale.  Neurological:     Mental Status: He is alert and oriented to person, place, and time.     Coordination: Coordination normal.     Gait: Gait normal.  Psychiatric:        Mood and Affect: Mood normal.     ED Results / Procedures / Treatments   Labs (all labs ordered are listed, but only abnormal results are displayed) Labs Reviewed  RESP PANEL BY RT-PCR (RSV, FLU A&B, COVID)  RVPGX2    EKG None  Radiology No results found.  Procedures Procedures    Medications Ordered in ED Medications  acetaminophen (TYLENOL) tablet 650 mg (650 mg Oral Given 04/24/22 1621)    ED Course/ Medical Decision Making/ A&P                             Medical Decision Making  Patient is a 32 year old male presenting with 6 days of URI symptoms.    Overall well-appearing on exam.  Pt afebrile without tonsillar exudate, erythema, or swelling.  Low clinical suspicion for strep pharyngitis.  Satting at 97-99% on room air.  Hemodynamically stable.  Good air movement without wheezing or rales.  No accessory muscle use, tripoding, drooling, nasal flaring, or retractions appreciated.  No hx of asthma or COPD, known hx of SLE on plaquenil.  No chest  pain, shortness of breath, or chest discomfort.  Considered CXR however low suspicion for pneumonia at this time.  Presents with mild cervical lymphadenopathy though without dysphagia.  Presentation non concerning for PTA or RPA.  No trismus or uvula deviation.  Pt able to drink water in ED without difficulty with intact air way.     Personal review of rheumatology telephone encounter from earlier today.  Pertinent information includes patient's request for a Z-Pak complaining of cold and sinus symptoms with productive cough.  Patient stated this started  4 days ago, although today pt reported to this provider that symptoms started 6 days ago.  Last Z-Pak was in November (around 11/27) with same symptoms.  No documentation appreciated of rheumatology recommendation today for further evaluation of patient's presenting symptoms of complaint.  COVID, flu, RSV negative.  Clinical diagnosis of likely viral pharyngitis.  No abx indicated.  Pt does not appear dehydrated, but did discuss importance of water rehydration.  Recommended PCP/Rheumatology follow up and conservative symptom management.  Recommend beginning Z-Pak if patient with symptoms longer than 1 week, sent to pharmacy.  Strict return precautions discussed.  Patient in NAD and in good condition at time of discharge.   After consideration the patient's encounter today, I do not feel today's workup suggests an emergent condition requiring admission or immediate intervention beyond what has been performed at this time.  Safe for discharge; instructed to return immediately for worsening symptoms, change in symptoms or any other concerns.  I have reviewed the patients home medicines and have made adjustments as needed.  Discussed course of treatment with the patient, whom demonstrated understanding.  Patient in agreement and has no further questions.    I discussed this case with my attending physician Dr. Rhunette Croft, who agreed with the proposed treatment  course or made changes to plan which were implemented.     This chart was dictated using voice recognition software.  Despite best efforts to proofread,  errors can occur which can change the documentation meaning.         Final Clinical Impression(s) / ED Diagnoses Final diagnoses:  Viral URI with cough    Rx / DC Orders ED Discharge Orders          Ordered    azithromycin (ZITHROMAX) 250 MG tablet  Daily        04/24/22 1753              Cecil Cobbs, PA-C 04/24/22 1756    Derwood Kaplan, MD 04/24/22 2324

## 2022-04-24 NOTE — Discharge Instructions (Addendum)
You were seen in the emergency department today for nasal congestion, runny nose, and cough.  We have tested you for Covid, Flu, and RSV and these results were negative.  However, it is still likely that your symptoms are related to a different viral illness.   Treatment is directed at relieving symptoms.  There is no cure for acute viral infections; it is usually best to let them run their course.  Symptoms usually last around 1-2 weeks, though cough may occasionally linger for up to 3-4 weeks.     Treatments may include:  Increased fluid intake. Sports drinks offer valuable electrolytes, sugars, and fluids.  Breathing heated mist or steam (vaporizer or shower).  Eating chicken soup or other clear broths, and maintaining good nutrition.  Getting plenty of rest.  Using lozenges, tea with honey, or warm salt water for cough relief/sore throat relief (Cepkaol or Halls lozenges are available over-the-counter) Increasing usage of your inhaler if you have asthma.   Take over-the-counter Benadryl, Zyrtec, or Claritin to decrease sinus secretions, with Mucinex to help break up remaining mucus Continue to alternate between Tylenol and ibuprofen for pain and fever control.  You may return to work 24 hours after your temperature has returned to normal.  Please follow up with your PCP/Rheumatologist for recheck of ongoing symptoms.  A Z-pak has been sent to the pharmacy, you may start this in 2-3 days if symptoms have not improved.  Return to emergency department for emergent changing or worsening of symptoms.

## 2022-04-24 NOTE — ED Provider Triage Note (Signed)
Emergency Medicine Provider Triage Evaluation Note  Aleksi Brummet , a 32 y.o. male  was evaluated in triage.  Pt complains of rhinorrhea, nasal congestion onset 6 days.  Has sick contacts at home.  Tried over-the-counter medications for symptoms.  Has associated cough.  Denies sore throat  Review of Systems  Positive:  Negative:   Physical Exam  BP 117/82 (BP Location: Right Arm)   Pulse 88   Temp 100 F (37.8 C) (Oral)   Resp 15   Wt 102 kg   SpO2 97%   BMI 37.42 kg/m  Gen:   Awake, no distress   Resp:  Normal effort  MSK:   Moves extremities without difficulty  Other:  Uvula midline without swelling.  Patent airway.  Able to speak in clear complete sentences.  No posterior pharyngeal erythema or tonsillar exudate noted.  Tolerating oral secretions without difficulty.  Medical Decision Making  Medically screening exam initiated at 4:15 PM.  Appropriate orders placed.  Kamarius Puffenbarger was informed that the remainder of the evaluation will be completed by another provider, this initial triage assessment does not replace that evaluation, and the importance of remaining in the ED until their evaluation is complete.     Carolene Gitto A, PA-C 04/24/22 1616

## 2022-11-13 ENCOUNTER — Other Ambulatory Visit: Payer: Self-pay

## 2022-11-13 ENCOUNTER — Encounter (HOSPITAL_COMMUNITY): Payer: Self-pay

## 2022-11-13 ENCOUNTER — Emergency Department (HOSPITAL_COMMUNITY)
Admission: EM | Admit: 2022-11-13 | Discharge: 2022-11-13 | Disposition: A | Discharge status: Home / Self Care | Payer: Medicaid Other | Attending: Emergency Medicine | Admitting: Emergency Medicine

## 2022-11-13 DIAGNOSIS — W130XXA Fall from, out of or through balcony, initial encounter: Secondary | ICD-10-CM | POA: Insufficient documentation

## 2022-11-13 DIAGNOSIS — J069 Acute upper respiratory infection, unspecified: Secondary | ICD-10-CM | POA: Diagnosis not present

## 2022-11-13 DIAGNOSIS — R059 Cough, unspecified: Secondary | ICD-10-CM | POA: Diagnosis present

## 2022-11-13 DIAGNOSIS — I10 Essential (primary) hypertension: Secondary | ICD-10-CM | POA: Diagnosis not present

## 2022-11-13 DIAGNOSIS — W19XXXA Unspecified fall, initial encounter: Secondary | ICD-10-CM

## 2022-11-13 DIAGNOSIS — M545 Low back pain, unspecified: Secondary | ICD-10-CM | POA: Diagnosis not present

## 2022-11-13 DIAGNOSIS — Z7982 Long term (current) use of aspirin: Secondary | ICD-10-CM | POA: Insufficient documentation

## 2022-11-13 DIAGNOSIS — G8911 Acute pain due to trauma: Secondary | ICD-10-CM | POA: Diagnosis not present

## 2022-11-13 DIAGNOSIS — Z79899 Other long term (current) drug therapy: Secondary | ICD-10-CM | POA: Insufficient documentation

## 2022-11-13 DIAGNOSIS — U071 COVID-19: Secondary | ICD-10-CM | POA: Insufficient documentation

## 2022-11-13 LAB — RESP PANEL BY RT-PCR (RSV, FLU A&B, COVID)  RVPGX2
Influenza A by PCR: NEGATIVE
Influenza B by PCR: NEGATIVE
Resp Syncytial Virus by PCR: NEGATIVE
SARS Coronavirus 2 by RT PCR: POSITIVE — AB

## 2022-11-13 NOTE — Discharge Instructions (Addendum)
You were seen in the emergency department today for headache, congestion, and back pain.  We have tested you for COVID, flu, and RSV and these results are doing.  You can follow-up the results on MyChart.  You can take Tylenol as needed for pain.  You can take up to 1000 mg of Tylenol every 6 hours.  Make sure that you are drinking lots of fluids and getting plenty of rest. You can take decongestants as long as you take them with lots of water. You can use lozenges or chloraseptic spray as needed for sore throat.   For your back I would recommend taking Tylenol, using a heating pad, or creams such as Biofreeze or IcyHot.  I also recommend some gentle movement as tolerated.  Continue to monitor how you are doing, and return to the emergency department for new or worsening symptoms such as chest pain, difficulty breathing not related to coughing, fever despite medication, or persistent vomiting or diarrhea.  It has been a pleasure taking care of you today and I hope you begin to feel better soon!

## 2022-11-13 NOTE — ED Triage Notes (Signed)
Patient reports headache and runny nose and daughter has been sick and wants to be checked out.  Also wants to be checked out since he fell off a porch Saturday.  Complains mid and lower back pain.

## 2022-11-13 NOTE — ED Provider Notes (Signed)
[facility] EMERGENCY DEPARTMENT AT [facility] Provider Note   CSN: [id number] Arrival date & time: 11/13/22  1257     History {Add pertinent medical, surgical, social history, OB history to HPI:1} Chief Complaint  Patient presents with   Fall   URI    Dan Adams is a 32 y.o. male with history of lupus, hypertension, migraines, rheumatoid arthritis, chronic back pain, anxiety, depression, who presents the emergency department complaining of headache, rhinorrhea, and lower back pain.  Patient states that his daughter has been sick, and he started developing symptoms the other day.  He is also complaining of lower back pain after he had a fall over the weekend.  He localizes his pain to his mid and his lower back.   Fall Associated symptoms include headaches.  URI Presenting symptoms: rhinorrhea   Associated symptoms: headaches        Home Medications Prior to Admission medications   Medication Sig Start Date End Date Taking? Authorizing Provider  amLODipine (NORVASC) 10 MG tablet Take 1 tablet (10 mg total) by mouth daily. 12/18/21   Noralee Stain, DO  aspirin EC 81 MG EC tablet Take 1 tablet (81 mg total) by mouth daily. 02/12/15   Leroy Sea, MD  azithromycin (ZITHROMAX) 250 MG tablet Take 1 tablet (250 mg total) by mouth daily. Take first 2 tablets together, then 1 every day until finished. 04/24/22   Cecil Cobbs, PA-C  Blood Pressure Monitoring ([employer] BLOOD PRESSURE MONITOR) KIT Check blood pressure at rest, same time, daily 12/17/21   Noralee Stain, DO  cloNIDine (CATAPRES) 0.1 MG tablet Take 1 tablet (0.1 mg total) by mouth 2 (two) times daily as needed (for SBP > 160 or DBP > 100). 12/17/21   Noralee Stain, DO  hydrocortisone (ANUSOL-HC) 25 MG suppository Place 1 suppository (25 mg total) rectally 2 (two) times daily. For 7 days 01/31/22   Gilda Crease, MD  hydroxychloroquine (PLAQUENIL) 200 MG tablet Take 2 tablets (400 mg total) by  mouth daily. 12/09/16   Kathlen Mody, MD  mycophenolate (CELLCEPT) 500 MG tablet Take 1,500 mg by mouth 2 (two) times daily.    [provider]  predniSONE (DELTASONE) 5 MG tablet Take 5-10 mg by mouth See admin instructions. 10 mg once daily for 30 days, 7.5 mg once daily for 30 days, 5 mg once daily for 60 days.    [provider]  Voclosporin (LUPKYNIS) 7.9 MG CAPS Take 23.7 mg by mouth 2 (two) times daily.    [provider]      Allergies    Phenergan [promethazine]    Review of Systems   Review of Systems  HENT:  Positive for rhinorrhea.   Musculoskeletal:  Positive for back pain.  Neurological:  Positive for headaches.  All other systems reviewed and are negative.   Physical Exam Updated Vital Signs BP 135/76 (BP Location: Right Arm)   Pulse 87   Temp 98.4 F (36.9 C) (Oral)   Resp 18   Ht 5\' 5"  (1.651 m)   Wt 101.6 kg   SpO2 100%   BMI 37.28 kg/m  Physical Exam  ED Results / Procedures / Treatments   Labs (all labs ordered are listed, but only abnormal results are displayed) Labs Reviewed  RESP PANEL BY RT-PCR (RSV, FLU A&B, COVID)  RVPGX2    EKG None  Radiology No results found.  Procedures Procedures  {Document cardiac monitor, telemetry assessment procedure when appropriate:1}  Medications Ordered in  ED Medications - No data to display  ED Course/ Medical Decision Making/ A&P   {   Click here for ABCD2, HEART and other calculatorsREFRESH Note before signing :1}                              Medical Decision Making  This patient is a 32 y.o. male who presents to the ED for concern of headache, rhinorrhea, and back pain after fall.   Differential diagnoses prior to evaluation: upper respiratory infection, lower respiratory infection, allergies, asthma, irritants, sinus/esophageal foreign body, medications, reflux, CHF, lung cancer, interstitial lung disease, postnasal drip, viral illness, Fracture (acute/chronic), muscle  strain, cauda equina / myelopathy, spinal stenosis, DDD, ligamentous injury, disk herniation, radiculopathy  Past Medical History / Social History / Additional history: Chart reviewed. Pertinent results include: lupus, hypertension, migraines, rheumatoid arthritis, chronic back pain, anxiety, depression  PDMP reviewed.   Physical Exam: Physical exam performed. The pertinent findings include: ***  Medications / Treatment: ***   Disposition: After consideration of the diagnostic results and the patients response to treatment, I feel that emergency department workup does not suggest an emergent condition requiring admission or immediate intervention beyond what has been performed at this time. Patient with back pain.  No neurological deficits and normal neuro exam.  Patient can walk but states is painful.  No loss of bowel or bladder control.  No concern for cauda equina.  No fever, night sweats, weight loss, h/o cancer, IVDU.  The plan is: ***. The patient is safe for discharge and has been instructed to return immediately for worsening symptoms, change in symptoms or any other concerns.  Final Clinical Impression(s) / ED Diagnoses Final diagnoses:  None    Rx / DC Orders ED Discharge Orders     None      Portions of this report may have been transcribed using voice recognition software. Every effort was made to ensure accuracy; however, inadvertent computerized transcription errors may be present.

## 2023-03-03 ENCOUNTER — Emergency Department (HOSPITAL_COMMUNITY): Payer: Medicaid Other

## 2023-03-03 ENCOUNTER — Other Ambulatory Visit: Payer: Self-pay

## 2023-03-03 ENCOUNTER — Emergency Department (HOSPITAL_COMMUNITY)
Admission: EM | Admit: 2023-03-03 | Discharge: 2023-03-03 | Disposition: A | Discharge status: Home / Self Care | Payer: Medicaid Other | Attending: Emergency Medicine | Admitting: Emergency Medicine

## 2023-03-03 DIAGNOSIS — Z79899 Other long term (current) drug therapy: Secondary | ICD-10-CM | POA: Insufficient documentation

## 2023-03-03 DIAGNOSIS — R531 Weakness: Secondary | ICD-10-CM | POA: Diagnosis not present

## 2023-03-03 DIAGNOSIS — J45909 Unspecified asthma, uncomplicated: Secondary | ICD-10-CM | POA: Diagnosis not present

## 2023-03-03 DIAGNOSIS — I10 Essential (primary) hypertension: Secondary | ICD-10-CM | POA: Diagnosis not present

## 2023-03-03 DIAGNOSIS — Z7982 Long term (current) use of aspirin: Secondary | ICD-10-CM | POA: Insufficient documentation

## 2023-03-03 DIAGNOSIS — R200 Anesthesia of skin: Secondary | ICD-10-CM | POA: Diagnosis not present

## 2023-03-03 DIAGNOSIS — R29818 Other symptoms and signs involving the nervous system: Secondary | ICD-10-CM

## 2023-03-03 DIAGNOSIS — R29898 Other symptoms and signs involving the musculoskeletal system: Secondary | ICD-10-CM

## 2023-03-03 DIAGNOSIS — R42 Dizziness and giddiness: Secondary | ICD-10-CM | POA: Diagnosis present

## 2023-03-03 LAB — RAPID URINE DRUG SCREEN, HOSP PERFORMED
Amphetamines: NOT DETECTED
Barbiturates: NOT DETECTED
Benzodiazepines: NOT DETECTED
Cocaine: NOT DETECTED
Opiates: NOT DETECTED
Tetrahydrocannabinol: NOT DETECTED

## 2023-03-03 LAB — COMPREHENSIVE METABOLIC PANEL
ALT: 25 U/L (ref 0–44)
AST: 27 U/L (ref 15–41)
Albumin: 3.8 g/dL (ref 3.5–5.0)
Alkaline Phosphatase: 54 U/L (ref 38–126)
Anion gap: 9 (ref 5–15)
BUN: 24 mg/dL — ABNORMAL HIGH (ref 6–20)
CO2: 22 mmol/L (ref 22–32)
Calcium: 9 mg/dL (ref 8.9–10.3)
Chloride: 105 mmol/L (ref 98–111)
Creatinine, Ser: 1.77 mg/dL — ABNORMAL HIGH (ref 0.61–1.24)
GFR, Estimated: 51 mL/min — ABNORMAL LOW (ref >60)
Glucose, Bld: 105 mg/dL — ABNORMAL HIGH (ref 70–99)
Potassium: 3.9 mmol/L (ref 3.5–5.1)
Sodium: 136 mmol/L (ref 135–145)
Total Bilirubin: 0.9 mg/dL (ref <1.2)
Total Protein: 6.2 g/dL — ABNORMAL LOW (ref 6.5–8.1)

## 2023-03-03 LAB — URINALYSIS, ROUTINE W REFLEX MICROSCOPIC
Bacteria, UA: NONE SEEN
Bilirubin Urine: NEGATIVE
Glucose, UA: NEGATIVE mg/dL
Ketones, ur: NEGATIVE mg/dL
Leukocytes,Ua: NEGATIVE
Nitrite: NEGATIVE
Protein, ur: 100 mg/dL — AB
Specific Gravity, Urine: 1.023 (ref 1.005–1.030)
pH: 6 (ref 5.0–8.0)

## 2023-03-03 LAB — CBC
HCT: 41.9 % (ref 39.0–52.0)
Hemoglobin: 14.3 g/dL (ref 13.0–17.0)
MCH: 29.1 pg (ref 26.0–34.0)
MCHC: 34.1 g/dL (ref 30.0–36.0)
MCV: 85.2 fL (ref 80.0–100.0)
Platelets: 233 10*3/uL (ref 150–400)
RBC: 4.92 MIL/uL (ref 4.22–5.81)
RDW: 12.1 % (ref 11.5–15.5)
WBC: 4.4 10*3/uL (ref 4.0–10.5)
nRBC: 0 % (ref 0.0–0.2)

## 2023-03-03 LAB — DIFFERENTIAL
Abs Immature Granulocytes: 0.03 10*3/uL (ref 0.00–0.07)
Basophils Absolute: 0 10*3/uL (ref 0.0–0.1)
Basophils Relative: 1 %
Eosinophils Absolute: 0.1 10*3/uL (ref 0.0–0.5)
Eosinophils Relative: 3 %
Immature Granulocytes: 1 %
Lymphocytes Relative: 32 %
Lymphs Abs: 1.4 10*3/uL (ref 0.7–4.0)
Monocytes Absolute: 0.5 10*3/uL (ref 0.1–1.0)
Monocytes Relative: 12 %
Neutro Abs: 2.3 10*3/uL (ref 1.7–7.7)
Neutrophils Relative %: 51 %

## 2023-03-03 LAB — I-STAT CHEM 8, ED
BUN: 26 mg/dL — ABNORMAL HIGH (ref 6–20)
Calcium, Ion: 1.2 mmol/L (ref 1.15–1.40)
Chloride: 105 mmol/L (ref 98–111)
Creatinine, Ser: 1.6 mg/dL — ABNORMAL HIGH (ref 0.61–1.24)
Glucose, Bld: 106 mg/dL — ABNORMAL HIGH (ref 70–99)
HCT: 43 % (ref 39.0–52.0)
Hemoglobin: 14.6 g/dL (ref 13.0–17.0)
Potassium: 4 mmol/L (ref 3.5–5.1)
Sodium: 139 mmol/L (ref 135–145)
TCO2: 22 mmol/L (ref 22–32)

## 2023-03-03 LAB — CBG MONITORING, ED: Glucose-Capillary: 115 mg/dL — ABNORMAL HIGH (ref 70–99)

## 2023-03-03 LAB — PROTIME-INR
INR: 1 (ref 0.8–1.2)
Prothrombin Time: 13.5 s (ref 11.4–15.2)

## 2023-03-03 LAB — I-STAT CG4 LACTIC ACID, ED: Lactic Acid, Venous: 1.1 mmol/L (ref 0.5–1.9)

## 2023-03-03 LAB — APTT: aPTT: 27 s (ref 24–36)

## 2023-03-03 LAB — ETHANOL: Alcohol, Ethyl (B): <10 mg/dL (ref <10)

## 2023-03-03 MED ORDER — PROCHLORPERAZINE EDISYLATE 10 MG/2ML IJ SOLN: 10.0000 mg | Freq: Once | INTRAMUSCULAR | Status: DC

## 2023-03-03 MED ORDER — IOHEXOL 350 MG/ML SOLN
75.0000 mL | Freq: Once | INTRAVENOUS | Status: AC | PRN
  Administered 2023-03-03: 75 mL via INTRAVENOUS

## 2023-03-03 MED ORDER — ACETAMINOPHEN 500 MG PO TABS
1000.0000 mg | ORAL_TABLET | Freq: Once | ORAL | Status: AC
Start: 2023-03-03 — End: 2023-03-03
  Administered 2023-03-03: 1000 mg via ORAL
  Filled 2023-03-03: qty 2

## 2023-03-03 MED ORDER — LORAZEPAM 1 MG PO TABS: 1.0000 mg | ORAL_TABLET | Freq: Once | ORAL | Status: DC

## 2023-03-03 NOTE — ED Provider Notes (Signed)
Patient signed out to me at 0700 by Dr. Eudelia Bunch pending MRI.  In short this is a 32 year old male with a past medical history of hypertension, migraines, lupus presenting to the emergency department as a code stroke.  Patient's last known well was 320 this morning and presented with left lower extremity weakness and dizziness.  Patient was evaluated by neurology.  CT head and CTA without acute abnormality, labs within normal range.  Patient was treated with migraine cocktail with improvement of symptoms.  On my evaluation, the patient is awake and alert in no acute distress.  He has no focal neurologic deficits on his exam, no drift in all 4 extremities, normal sensation in all 4 extremities, normal finger-to-nose, normal speech.  He states he feels back to normal.  MRI to rule out stroke is pending at this time.  Will be outside [facility] window at 0750.  Clinical Course as of 03/03/23 1317  Sun Mar 03, 2023  1119 Patient refused MRI despite anxiolysis.  [VK]  1247 I spoke with Dr. Derry Lory who still recommends MRI and if the patient is refusing would discharge against medical advice.  [VK]  1314 The patient has decided to leave against medical advice. The patient had a normal mental status examination and understands his condition and the risks of leaving including stroke leading to permanent disability and death. The patient has had an opportunity to ask questions about his medical condition. The patient has been informed  that he may return for care at any time and has been referred to his physician.    [VK]    Clinical Course User Index [VK] Rexford Maus, DO      Rexford Maus, [place] 03/03/23 1317

## 2023-03-03 NOTE — ED Notes (Signed)
Pt refusing MRI at this time even with medication

## 2023-03-03 NOTE — ED Triage Notes (Signed)
Pt arrives to ED c/o left sided sensation deficients and weakness. LKN @ 0320. Pt was at work when reporting dizziness and sensation deficits. PT GCS 15 at this time

## 2023-03-03 NOTE — Progress Notes (Signed)
Brief Neuro Update:  Was notified by Dr. Theresia Lo about patient declining MRI due to claustrophobia. Does not want to re-attempt MRI with medications. He wants to go home. He is improved. With his risk factors for stroke, I still think MRI is warranted.  Erick Blinks [facility]

## 2023-03-03 NOTE — ED Notes (Signed)
Patient transported to MRI 

## 2023-03-03 NOTE — ED Provider Notes (Signed)
[place] EMERGENCY DEPARTMENT AT [facility] Provider Note  CSN: [id number] Arrival date & time: 03/03/23 0443  Chief Complaint(s) Code Stroke  HPI Dan Adams is a 32 y.o. male with a past medical history listed below who presents to the emergency department with sudden onset lightheadedness and left leg numbness and weakness.  Last known normal 3:20 AM.  Symptoms still ongoing.  No associated headache.  No chest pain or shortness of breath.  No nausea or vomiting.  Presented by EMS as a code stroke.  The history is provided by the patient.    Past Medical History Past Medical History:  Diagnosis Date   Anxiety    Childhood asthma    Chronic back pain    Depression    Hypertension    Leukopenia 09/07/2013   Lupus (HCC)    Migraine    "@ least 2-3 times/wk now" (02/10/2015)   RA (rheumatoid arthritis) (HCC)    "feet, fingers, knees; it's from my lupus" (02/10/2015)   Stroke-like symptoms 02/09/2015-02/10/2015   "right-left"   Systemic lupus (HCC)    Thrombocytopenia, unspecified (HCC) 09/07/2013   Patient Active Problem List   Diagnosis Date Noted   Hypertensive emergency 12/16/2021   AKI (acute kidney injury) (HCC) 12/15/2021   Normocytic anemia    Pleural effusion 12/06/2016   Pericardial effusion 12/06/2016   Chest pain 12/06/2016   Fever, unspecified 04/30/2015   Constipation 04/29/2015   GERD (gastroesophageal reflux disease) 04/29/2015   Generalized anxiety disorder 03/02/2015   TIA (transient ischemic attack) 02/10/2015   Stroke-like symptoms 02/10/2015   Neurologic disorder    Non compliance with medical treatment 12/28/2014   Mouth ulcers 12/28/2014   Vitamin B12 deficiency 08/17/2014   Vitamin D deficiency 08/17/2014   Lupus (systemic lupus erythematosus) (HCC) 08/13/2014   Leukopenia 09/07/2013   Thrombocytopenia, unspecified (HCC) 09/07/2013   Home Medication(s) Prior to Admission medications   Medication Sig Start Date End Date  Taking? Authorizing Provider  amLODipine (NORVASC) 10 MG tablet Take 1 tablet (10 mg total) by mouth daily. 12/18/21  Yes Noralee Stain, DO  aspirin EC 81 MG EC tablet Take 1 tablet (81 mg total) by mouth daily. 02/12/15  Yes Leroy Sea, MD  carvedilol (COREG) 25 MG tablet Take 25 mg by mouth daily. 03/30/22 10/19/23 Yes [provider]  hydroxychloroquine (PLAQUENIL) 200 MG tablet Take 2 tablets (400 mg total) by mouth daily. 12/09/16  Yes Kathlen Mody, MD  lisinopril (ZESTRIL) 20 MG tablet Take 20 mg by mouth daily.   Yes [provider]  mycophenolate (CELLCEPT) 500 MG tablet Take 1,500 mg by mouth 2 (two) times daily.   Yes [provider]  predniSONE (DELTASONE) 2.5 MG tablet Take 2.5 mg by mouth daily. .   Yes [provider]  Blood Pressure Monitoring ([employer] BLOOD PRESSURE MONITOR) KIT Check blood pressure at rest, same time, daily 12/17/21   Noralee Stain, DO  Allergies Phenergan [promethazine]  Review of Systems Review of Systems As noted in HPI  Physical Exam Vital Signs  I have reviewed the triage vital signs BP 116/70   Pulse 77   Temp (!) 97.4 F (36.3 C) (Oral)   Resp (!) 23   Ht 5\' 4"  (1.626 m)   Wt 106.6 kg   SpO2 100%   BMI 40.34 kg/m   Physical Exam Vitals reviewed.  Constitutional:      General: He is not in acute distress.    Appearance: He is well-developed. He is not diaphoretic.  HENT:     Head: Normocephalic and atraumatic.     Nose: Nose normal.  Eyes:     General: No scleral icterus.       Right eye: No discharge.        Left eye: No discharge.     Conjunctiva/sclera: Conjunctivae normal.     Pupils: Pupils are equal, round, and reactive to light.  Cardiovascular:     Rate and Rhythm: Normal rate and regular rhythm.     Heart sounds: No murmur heard.    No friction rub. No  gallop.  Pulmonary:     Effort: Pulmonary effort is normal. No respiratory distress.     Breath sounds: Normal breath sounds. No stridor. No rales.  Abdominal:     General: There is no distension.     Palpations: Abdomen is soft.     Tenderness: There is no abdominal tenderness.  Musculoskeletal:        General: No tenderness.     Cervical back: Normal range of motion and neck supple.  Skin:    General: Skin is warm and dry.     Findings: No erythema or rash.  Neurological:     Mental Status: He is alert and oriented to person, place, and time.     Cranial Nerves: Cranial nerves 2-12 are intact.     Sensory: Sensory deficit (decreased sensation in LLE only) present.     Motor: Weakness (4/5 in LLE; 5/5 to rest of extremities) present.     Coordination: Coordination is intact. Coordination normal.     ED Results and Treatments Labs (all labs ordered are listed, but only abnormal results are displayed) Labs Reviewed  COMPREHENSIVE METABOLIC PANEL - Abnormal; Notable for the following components:      Result Value   Glucose, Bld 105 (*)    BUN 24 (*)    Creatinine, Ser 1.77 (*)    Total Protein 6.2 (*)    GFR, Estimated 51 (*)    All other components within normal limits  I-STAT CHEM 8, ED - Abnormal; Notable for the following components:   BUN 26 (*)    Creatinine, Ser 1.60 (*)    Glucose, Bld 106 (*)    All other components within normal limits  CBG MONITORING, ED - Abnormal; Notable for the following components:   Glucose-Capillary 115 (*)    All other components within normal limits  ETHANOL  PROTIME-INR  APTT  CBC  DIFFERENTIAL  RAPID URINE DRUG SCREEN, HOSP PERFORMED  URINALYSIS, ROUTINE W REFLEX MICROSCOPIC  I-STAT CG4 LACTIC ACID, ED  EKG  EKG Interpretation Date/Time:  Sunday March 03 2023 05:11:09 EST Ventricular Rate:  73 PR  Interval:  145 QRS Duration:  92 QT Interval:  355 QTC Calculation: 392 R Axis:   31  Text Interpretation: Sinus rhythm Nonspecific T abnormalities, lateral leads Borderline ST elevation, anterior leads No acute changes Confirmed by Drema Pry [phone]) on 03/03/2023 6:45:54 AM       Radiology CT ANGIO HEAD NECK W WO CM (CODE STROKE)  Result Date: 03/03/2023 CLINICAL DATA:  32 year old male code stroke presentation with left side deficits. EXAM: CT ANGIOGRAPHY HEAD AND NECK TECHNIQUE: Multidetector CT imaging of the head and neck was performed using the standard protocol during bolus administration of intravenous contrast. Multiplanar CT image reconstructions and MIPs were obtained to evaluate the vascular anatomy. Carotid stenosis measurements (when applicable) are obtained utilizing NASCET criteria, using the distal internal carotid diameter as the denominator. RADIATION DOSE REDUCTION: This exam was performed according to the departmental dose-optimization program which includes automated exposure control, adjustment of the mA and/or kV according to patient size and/or use of iterative reconstruction technique. CONTRAST:  75mL OMNIPAQUE IOHEXOL 350 MG/ML SOLN COMPARISON:  Head CT 0456 hours today. Prior CTA head and neck 02/10/2015. FINDINGS: CTA NECK Skeleton: Substantial motion artifact obscuring the upper half of the calvarium. No acute osseous abnormality identified. Upper chest: Negative. Other neck: Mild left jugular venous contrast reflux from left upper extremity injection. Mild pharynx and larynx motion artifact. Aortic arch: 4 vessel arch, left vertebral artery arises directly from the arch. No arch atherosclerosis. Right carotid system: Marginal but adequate contrast timing. Negative. Left carotid system: Negative. Vertebral arteries: Negative proximal right subclavian artery and right vertebral artery origin. Tortuous right V1 segment, better demonstrated on the previous exam. Limited  detail of the proximal V2 segment due to streak artifact. But the right vertebral appears to remain patent, caliber stable since 2016. Left vertebral artery arises directly from the arch. Late entry into the cervical transverse foramen. Similar limited detail of the proximal V2 segment due to streak. Tortuous vessel at those levels. The left vertebral artery appears to remain patent with stable caliber since 2016. CTA HEAD Posterior circulation: Fairly codominant distal vertebral arteries, vertebrobasilar junction, PICA origins are normal. Patent basilar artery without stenosis. Normal SCA and PCA origins. Both posterior communicating arteries are present. Bilateral PCA branches are stable and within normal limits. Anterior circulation: Both ICA siphons appear patent and normal. Chronically tortuous left ICA just below the skull base. Normal posterior communicating artery origins. Patent carotid termini. Normal MCA and ACA origins. Diminutive or absent anterior communicating artery. Bilateral ACA branches to the A3 segment remain normal, distal branches obscured by motion. Left MCA M1 segment and bifurcation are patent without stenosis. Proximal left MCA branches are stable and within normal limits, distal branches obscured by motion. Right MCA M1 segment and bifurcation are patent without stenosis. Proximal right MCA branches appear stable and within normal limits, distal branches obscured by motion. Venous sinuses: Not well evaluated. Anatomic variants: Codominant left vertebral artery arises directly from the arch. Review of the MIP images confirms the above findings IMPRESSION: 1. Motion degraded exam at the upper half of the calvarium, obscuring distal ACA and MCA branches. 2. But otherwise negative CTA Head and Neck. No atherosclerosis or large vessel occlusion identified. Electronically Signed   By: Odessa Fleming M.D.   On: 03/03/2023 05:16   CT HEAD CODE STROKE WO CONTRAST  Result Date: 03/03/2023 CLINICAL  DATA:  Code  stroke.  32 year old male.  Left side deficits. EXAM: CT HEAD WITHOUT CONTRAST TECHNIQUE: Contiguous axial images were obtained from the base of the skull through the vertex without intravenous contrast. RADIATION DOSE REDUCTION: This exam was performed according to the departmental dose-optimization program which includes automated exposure control, adjustment of the mA and/or kV according to patient size and/or use of iterative reconstruction technique. COMPARISON:  Brain MRI and head CT 02/10/2015 FINDINGS: Brain: Cerebral volume remains normal. Small sulcus artifact right middle frontal gyrus series 3, image 21, confirmed on previous MRI (normal finding). No midline shift, ventriculomegaly, mass effect, evidence of mass lesion, intracranial hemorrhage or evidence of cortically based acute infarction. Gray-white matter differentiation is within normal limits throughout the brain. Partially empty sella is new since 2016. Vascular: No suspicious intracranial vascular hyperdensity. Skull: Stable, intact. Sinuses/Orbits: Visualized paranasal sinuses and mastoids are stable and well aerated. Other: No gaze deviation. Visualized orbits and scalp soft tissues are within normal limits. ASPECTS [facility] Stroke Program Early CT Score) Total score (0-10 with 10 being normal): 10 IMPRESSION: 1. Partially empty sella is new since 2016, often a normal anatomic variant, but can be associated with idiopathic intracranial hypertension (pseudotumor cerebri). 2. Otherwise normal noncontrast CT appearance of the brain. ASPECTS 10. 3. These results were communicated to Dr. Amada Jupiter at 4:59 am on 03/03/2023 by text page via the [facility] messaging system. Electronically Signed   By: Odessa Fleming M.D.   On: 03/03/2023 04:59    Medications Ordered in ED Medications  prochlorperazine (COMPAZINE) injection 10 mg (10 mg Intravenous Patient Refused/Not Given 03/03/23 0523)  iohexol (OMNIPAQUE) 350 MG/ML injection 75 mL (75 mLs  Intravenous Contrast Given 03/03/23 0458)  acetaminophen (TYLENOL) tablet 1,000 mg (1,000 mg Oral Given 03/03/23 0528)   Procedures Procedures  (including critical care time) Medical Decision Making / ED Course   Medical Decision Making Amount and/or Complexity of Data Reviewed Labs: ordered. Decision-making details documented in ED Course. Radiology: ordered and independent interpretation performed. Decision-making details documented in ED Course. ECG/medicine tests: ordered and independent interpretation performed. Decision-making details documented in ED Course.  Risk OTC drugs. Prescription drug management.    Code Stroke for left sided deficits.  Patient sent to CT immediately.  CT imaging is reassuring.  Neurology recommends MRI to rule out stroke but treat for migraine headaches.  Patient given migraine cocktail and on reassessment 2 hours later, he reports that his symptoms have completely resolved.  Patient's strength and sensation are symmetric  Labs are also reassuring without any evidence of electrolyte or metabolic derangements.  Patient does have renal sufficiency but close to his baseline.  Currently pending MRI. Patient care turned over to oncoming provider. Patient case and results discussed in detail; please see their note for further ED managment.       Final Clinical Impression(s) / ED Diagnoses Final diagnoses:  None    This chart was dictated using voice recognition software.  Despite best efforts to proofread,  errors can occur which can change the documentation meaning.    Nira Conn, MD 03/03/23 [phone]

## 2023-03-03 NOTE — Consult Note (Signed)
NEUROLOGY CONSULT NOTE   Date of service: March 03, 2023 Patient Name: Dan Adams MRN:  [id number] DOB:  10/11/1990 Chief Complaint: "Left leg numbness" Requesting Provider: Nira Conn, *  History of Present Illness  Dan Adams is a 32 y.o. 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   He presents with left leg numbness and weakness.  He states that he got to work around 320 and felt normal at that time.  Subsequently, however, he began feeling quite lightheaded and then noted numbness of his left leg.  He denies tingling, just states that it felt "light."  Due to the symptoms none was: Is brought in emergently as a code stroke.  He is taken for CT/CTA.  CT was negative, CTA revealed patent vasculature up through the proximal M2's, but he became nauseated as the dye was being injected and moved his head obscuring the distal branches.  I discussed with him IV TNK, and that based on the mildness of his symptoms I would not recommend thrombolytic therapy at this time and he agreed.  He has had similar episodes on at least two other previous occasions.  Both of those were associate with a headache, but he denies headache this time.  LKW: 3:20 AM Modified rankin score: 0-Completely asymptomatic and back to baseline post- stroke IV Thrombolysis:  No, mild symptoms EVT: No LVO NIHSS 2 for left leg drift and left leg numbness   Past History   Past Medical History:  Diagnosis Date   Anxiety    Childhood asthma    Chronic back pain    Depression    Hypertension    Leukopenia 09/07/2013   Lupus (HCC)    Migraine    "@ least 2-3 times/wk now" (02/10/2015)   RA (rheumatoid arthritis) (HCC)    "feet, fingers,  knees; it's from my lupus" (02/10/2015)   Stroke-like symptoms 02/09/2015-02/10/2015   "right-left"   Systemic lupus (HCC)    Thrombocytopenia, unspecified (HCC) 09/07/2013    Past Surgical History:  Procedure Laterality Date   IR THORACENTESIS ASP PLEURAL SPACE W/IMG GUIDE  12/07/2016   TONSILLECTOMY AND ADENOIDECTOMY  2007    Family History: Family History  Problem Relation Age of Onset   Lupus Neg Hx     Social History  reports that he quit smoking about 9 years ago. His smoking use included cigarettes. He started smoking about 12 years ago. He has a 7.5 pack-year smoking history. He has never used smokeless tobacco. He reports that he does not drink alcohol and does not use drugs.  Allergies  Allergen Reactions   Phenergan [Promethazine] Nausea And Vomiting    Medications  No current facility-administered medications for this encounter.  Current Outpatient Medications:    amLODipine (NORVASC) 10 MG tablet, Take 1 tablet (10 mg total) by mouth daily., Disp: 30 tablet, Rfl: 0   aspirin EC 81 MG EC tablet, Take 1 tablet (81 mg total) by mouth daily., Disp: 30 tablet, Rfl: 0   azithromycin (ZITHROMAX) 250 MG tablet, Take 1 tablet (250 mg total) by mouth daily. Take first 2 tablets together, then 1 every day until finished., Disp: 6 tablet, Rfl: 0   Blood Pressure Monitoring ([employer] BLOOD PRESSURE MONITOR) KIT, Check blood pressure at rest, same time, daily, Disp: 1 kit, Rfl: 0   cloNIDine (CATAPRES) 0.1  MG tablet, Take 1 tablet (0.1 mg total) by mouth 2 (two) times daily as needed (for SBP > 160 or DBP > 100)., Disp: 60 tablet, Rfl: 11   hydrocortisone (ANUSOL-HC) 25 MG suppository, Place 1 suppository (25 mg total) rectally 2 (two) times daily. For 7 days, Disp: 14 suppository, Rfl: 0   hydroxychloroquine (PLAQUENIL) 200 MG tablet, Take 2 tablets (400 mg total) by mouth daily., Disp: , Rfl: 0   mycophenolate (CELLCEPT) 500 MG tablet, Take 1,500 mg by mouth 2 (two) times daily., Disp:  , Rfl:    predniSONE (DELTASONE) 5 MG tablet, Take 5-10 mg by mouth See admin instructions. 10 mg once daily for 30 days, 7.5 mg once daily for 30 days, 5 mg once daily for 60 days., Disp: , Rfl:    Voclosporin (LUPKYNIS) 7.9 MG CAPS, Take 23.7 mg by mouth 2 (two) times daily., Disp: , Rfl:   Vitals   Vitals:   2023/03/30 0400  Weight: 109 kg    Body mass index is 39.99 kg/m.  Physical Exam   Constitutional: Appears well-developed and well-nourished.   Neurologic Examination    Neuro: Mental Status: Patient is awake, alert, oriented to person, place, month, and situation. Patient is able to give a clear and coherent history. No signs of aphasia or neglect Cranial Nerves: II: Visual Fields are full. Pupils are equal, round, and reactive to light.   III,IV, VI: EOMI without ptosis or diploplia.  V: Facial sensation is symmetric to temperature VII: Facial movement is symmetric.  VIII: hearing is intact to voice X: Uvula elevates symmetrically XII: tongue is midline without atrophy or fasciculations.  Motor: Tone is normal. Bulk is normal. 5/5 strength was present in bilateral upper extremities and right leg, he has very mild weakness in the left lower extremity Sensory: Sensation is diminished in the left leg Cerebellar: FNF and HKS are intact bilaterally    Labs/Imaging/Neurodiagnostic studies   CBC:  Recent Labs  Lab Mar 30, 2023 0452 03/30/23 0453  WBC  --  4.4  NEUTROABS  --  2.3  HGB 14.6 14.3  HCT 43.0 41.9  MCV  --  85.2  PLT  --  233   Basic Metabolic Panel:  Lab Results  Component Value Date   NA 139 2023/03/30   K 4.0 03/30/2023   CO2 25 01/30/2022   GLUCOSE 106 (H) 03-30-2023   BUN 26 (H) 30-Mar-2023   CREATININE 1.60 (H) 03-30-23   CALCIUM 9.0 01/30/2022   GFRNONAA 43 (L) 01/30/2022   GFRAA >60 11/26/2018   Lipid Panel:  Lab Results  Component Value Date   LDLCALC 103 (H) 02/10/2015   HgbA1c:  Lab Results  Component Value Date   HGBA1C  5.2 02/10/2015   Urine Drug Screen: No results found for: "LABOPIA", "COCAINSCRNUR", "LABBENZ", "AMPHETMU", "THCU", "LABBARB"  Alcohol Level No results found for: "ETH" INR  Lab Results  Component Value Date   INR 1.07 05/28/2016   APTT  Lab Results  Component Value Date   APTT 27 05/28/2016    CT Head without contrast(Personally reviewed): negative  CT angio Head and Neck with contrast(Personally reviewed): Motion degraded  ASSESSMENT   Dan Adams is a 32 y.o. male with mild left leg weakness that started abruptly at work tonight.  With his previous episodes being very similar, I think complicated migraine is a significant possibility.  Certainly, however, with his lupus, this can predispose to certain hypercoagulable state, and I would favor ruling out stroke with MRI.  RECOMMENDATIONS  MRI brain If negative would treat as complicated migraine. ______________________________________________________________________    Stormy Card, MD [facility]

## 2023-03-03 NOTE — Code Documentation (Addendum)
Responded to Code Stroke called at 0425 for L sided weakness/numbness, [id number]. Pt arrived at 0443, CBG-115, NIH-2 for LLE drift/numbness, CT head negative for acute changes. TNK not given-"too good to treat." Plan MRI. Pt remains in TNK window until 0750. Please complete VS/neuro checks q43m until then.

## 2023-03-03 NOTE — Discharge Instructions (Signed)
You were seen in the emergency department for your left leg weakness.  We were concerned that this could be from a stroke and you are recommended for an MRI to have further evaluation to determine if this was a stroke however you chose to leave AGAINST MEDICAL ADVICE.  If you did have a stroke you are at risk to have a bigger stroke in the future that could lead to permanent disability or death.  I have given you a referral to follow-up with neurology as an outpatient.  You should return to the emergency department if you would like completion of your workup, if you are having worsening numbness or weakness or any other new or concerning symptoms.

## 2023-03-03 NOTE — ED Notes (Signed)
Pt placed on cardiac monitor. CCMD notified

## 2023-03-03 NOTE — ED Notes (Signed)
Pt refused MRI, MD notified

## 2023-07-05 ENCOUNTER — Emergency Department (HOSPITAL_COMMUNITY)
Admission: EM | Admit: 2023-07-05 | Discharge: 2023-07-06 | Disposition: A | Discharge status: Home / Self Care | Attending: Emergency Medicine | Admitting: Emergency Medicine

## 2023-07-05 DIAGNOSIS — R110 Nausea: Secondary | ICD-10-CM | POA: Diagnosis present

## 2023-07-05 DIAGNOSIS — K219 Gastro-esophageal reflux disease without esophagitis: Secondary | ICD-10-CM | POA: Diagnosis not present

## 2023-07-06 ENCOUNTER — Other Ambulatory Visit: Payer: Self-pay

## 2023-07-06 ENCOUNTER — Encounter (HOSPITAL_COMMUNITY): Payer: Self-pay | Admitting: Emergency Medicine

## 2023-07-06 NOTE — ED Triage Notes (Signed)
 Pt to ED from home c/o acid reflux.  States was eating chia seeds tonight and then drinking water and then started having burning sensation in epigastric/chest area and burping that burns.  States he wants an xray to make sure he doesn't have any swelling.  Denies SOB or trouble swallowing.

## 2023-07-06 NOTE — Discharge Instructions (Addendum)
 Please follow up with your primary care doctor. See the attached information regarding diet changes for improvement of reflux symptoms. Return to the ER with any new severe symptom.

## 2023-07-06 NOTE — ED Provider Notes (Signed)
 [place] EMERGENCY DEPARTMENT AT [facility] Provider Note   CSN: [id number] Arrival date & time: 07/05/23  2352     History  Chief Complaint  Patient presents with   Gastroesophageal Reflux    Dan Adams is a 33 y.o. male with history of SLE who states that he is managing his symptoms with dietary and lifestyle changes.  He presents today with concern for what he is describing as a panic attack.  Patient states he has been making significant changes to his diet, cutting out all meat with a primarily plant-based diet at this time.  States that he has been taking Chia seeds for the last few days and feels that they have been causing increased reflux.  States that today he began to experience reflux this evening after he taking his [place] Apley seeds and began to become more more anxious secondary to the reflux concerning that may be the "GSH would swell and block my esophagus".  Patient is tolerating his secretions well, tolerating p.o. with liquids without difficulty since taking his Chia seeds and currently states he feels asymptomatic with resolution of his symptoms.  Patient states he does have a history of anxiety and feels that his symptoms today were likely mostly related to this.  I have reviewed his medical records.  He has history of vitamin B12 and D deficiencies, TIA, GERD, lupus.  He is not taking any medications daily.  He is post be on Plaquenil but states he has removed himself from this medication due to preference for pursuing holistic management of his SLE.  HPI     Home Medications Prior to Admission medications   Medication Sig Start Date End Date Taking? Authorizing Provider  amLODipine (NORVASC) 10 MG tablet Take 1 tablet (10 mg total) by mouth daily. 12/18/21   Noralee Stain, DO  aspirin EC 81 MG EC tablet Take 1 tablet (81 mg total) by mouth daily. 02/12/15   Leroy Sea, MD  Blood Pressure Monitoring ([employer] BLOOD PRESSURE MONITOR) KIT Check blood  pressure at rest, same time, daily 12/17/21   Noralee Stain, DO  carvedilol (COREG) 25 MG tablet Take 25 mg by mouth daily. 03/30/22 10/19/23  [provider]  hydroxychloroquine (PLAQUENIL) 200 MG tablet Take 2 tablets (400 mg total) by mouth daily. 12/09/16   Kathlen Mody, MD      Allergies    Phenergan [promethazine]    Review of Systems   Review of Systems  Gastrointestinal:        Reflux symptoms with burning sensation.  Belching.    Physical Exam Updated Vital Signs BP (!) 140/87 (BP Location: Right Arm)   Pulse 80   Temp 98 F (36.7 C) (Oral)   Resp 18   Ht 5\' 5"  (1.651 m)   Wt 89.8 kg   SpO2 99%   BMI 32.95 kg/m  Physical Exam Vitals and nursing note reviewed.  Constitutional:      Appearance: He is obese. He is not ill-appearing or toxic-appearing.  HENT:     Head: Normocephalic and atraumatic.     Mouth/Throat:     Mouth: Mucous membranes are moist.     Pharynx: No oropharyngeal exudate or posterior oropharyngeal erythema.  Eyes:     General:        Right eye: No discharge.        Left eye: No discharge.     Conjunctiva/sclera: Conjunctivae normal.  Cardiovascular:     Rate and Rhythm: Normal rate and  regular rhythm.     Pulses: Normal pulses.     Heart sounds: Normal heart sounds. No murmur heard. Pulmonary:     Effort: Pulmonary effort is normal. No respiratory distress.     Breath sounds: Normal breath sounds. No wheezing or rales.  Abdominal:     General: Bowel sounds are normal. There is no distension.     Palpations: Abdomen is soft.     Tenderness: There is no abdominal tenderness. There is no guarding or rebound.  Musculoskeletal:        General: No deformity.     Cervical back: Neck supple.     Right lower leg: No edema.     Left lower leg: No edema.  Skin:    General: Skin is warm and dry.     Capillary Refill: Capillary refill takes less than 2 seconds.  Neurological:     General: No focal deficit present.     Mental Status: He  is alert and oriented to person, place, and time. Mental status is at baseline.  Psychiatric:        Mood and Affect: Mood normal.     ED Results / Procedures / Treatments   Labs (all labs ordered are listed, but only abnormal results are displayed) Labs Reviewed - No data to display  EKG None  Radiology No results found.  Procedures Procedures    Medications Ordered in ED Medications - No data to display  ED Course/ Medical Decision Making/ A&P Clinical Course as of 07/06/23 0539  Sat Jul 06, 2023  0233 Patient no longer taking plaquenil.  [RS]    Clinical Course User Index [RS] Yuriel Lopezmartinez, Eugene Gavia, PA-C                                 Medical Decision Making 33 year old male presents with belching and reflux symptoms with burning.  Hyper upon intake and vitals otherwise normal.  Cardiopulmonary abdominal exams are benign.  Patient well-appearing.  DDx includes limited to GERD, food bolus impaction, gastritis, PUD, ACS, esophagitis, anxiety..  Patient offered workup given report of epigastric discomfort, however clinical picture is most consistent with acute GERD exacerbation.  Patient is hemodynamically stable, clinical history inconsistent with ACS.  Patient also offered a GI cocktail and PPI with patient declining these medications.  He states he is asymptomatic at this time and prefer to follow-up in the outpatient setting.  Feel this is reasonable.  Clinical concern for emergent underlying condition that would warrant further ED workup and patient management is exceedingly low.  Adarryl voiced understanding of his medical evaluation and treatment plan. Each of their questions answered to their expressed satisfaction.  Return precautions were given.  Patient is well-appearing, stable, and was discharged in good condition.  This chart was dictated using voice recognition software, Dragon. Despite the best efforts of this provider to proofread and correct errors,  errors may still occur which can change documentation meaning.        Final Clinical Impression(s) / ED Diagnoses Final diagnoses:  Gastroesophageal reflux disease, unspecified whether esophagitis present    Rx / DC Orders ED Discharge Orders     None         Sherrilee Gilles 07/06/23 0539    Dione Booze, MD 07/06/23 [phone]

## 2023-07-28 ENCOUNTER — Emergency Department (HOSPITAL_COMMUNITY)
Admission: EM | Admit: 2023-07-28 | Discharge: 2023-07-29 | Disposition: A | Discharge status: Home / Self Care | Attending: Emergency Medicine | Admitting: Emergency Medicine

## 2023-07-28 ENCOUNTER — Encounter (HOSPITAL_COMMUNITY): Payer: Self-pay

## 2023-07-28 DIAGNOSIS — Y92410 Unspecified street and highway as the place of occurrence of the external cause: Secondary | ICD-10-CM | POA: Diagnosis not present

## 2023-07-28 DIAGNOSIS — M7918 Myalgia, other site: Secondary | ICD-10-CM

## 2023-07-28 DIAGNOSIS — Z7982 Long term (current) use of aspirin: Secondary | ICD-10-CM | POA: Diagnosis not present

## 2023-07-28 DIAGNOSIS — M549 Dorsalgia, unspecified: Secondary | ICD-10-CM | POA: Insufficient documentation

## 2023-07-28 DIAGNOSIS — Z79899 Other long term (current) drug therapy: Secondary | ICD-10-CM | POA: Diagnosis not present

## 2023-07-28 DIAGNOSIS — M542 Cervicalgia: Secondary | ICD-10-CM | POA: Diagnosis present

## 2023-07-28 MED ORDER — METHOCARBAMOL 500 MG PO TABS: 500.0000 mg | ORAL_TABLET | Freq: Two times a day (BID) | ORAL | 0 refills | Status: AC

## 2023-07-28 NOTE — Discharge Instructions (Addendum)
 Motrin  and Tylenol  as needed as directed for pain. Warm compresses for sore muscles for 20 minutes at a time, follow with gentle stretching.  Take Robaxin  as needed for muscle tightness as prescribed. Do NOT take if driving or operating machinery.
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
## 2023-09-28 ENCOUNTER — Encounter (HOSPITAL_COMMUNITY): Payer: Self-pay

## 2023-09-28 ENCOUNTER — Emergency Department (HOSPITAL_COMMUNITY)
Admission: EM | Admit: 2023-09-28 | Discharge: 2023-09-28 | Disposition: A | Discharge status: Home / Self Care | Attending: Emergency Medicine | Admitting: Emergency Medicine

## 2023-09-28 ENCOUNTER — Other Ambulatory Visit: Payer: Self-pay

## 2023-09-28 DIAGNOSIS — Z7982 Long term (current) use of aspirin: Secondary | ICD-10-CM | POA: Diagnosis not present

## 2023-09-28 DIAGNOSIS — K644 Residual hemorrhoidal skin tags: Secondary | ICD-10-CM | POA: Diagnosis not present

## 2023-09-28 DIAGNOSIS — K6289 Other specified diseases of anus and rectum: Secondary | ICD-10-CM | POA: Diagnosis present

## 2023-09-28 MED ORDER — HYDROCORTISONE (PERIANAL) 2.5 % EX CREA: 1.0000 | TOPICAL_CREAM | Freq: Two times a day (BID) | CUTANEOUS | 0 refills | Status: AC

## 2023-09-28 NOTE — ED Triage Notes (Signed)
 Pt is coming in for lightly streaked red blood in stool with a Hx of hemorrhoids, he was lifting some heavy object at work and now the hemorrhoid has enlarged and become more painful. He is otherwise stable with no other complaints at this time.

## 2023-09-28 NOTE — Discharge Instructions (Signed)
 Use the Anusol  cream as prescribed. You can also take sitz baths for additional comfort measures.  Follow up with [facility] if symptoms persist.

## 2023-09-28 NOTE — ED Provider Notes (Signed)
  [place] EMERGENCY DEPARTMENT AT [facility] Provider Note   CSN: [id number] Arrival date & time: 09/28/23  9447     Patient presents with: Hemorrhoids   Dan Adams is a 33 y.o. male.   Patient with recurrent rectal pain and swelling. He reports soft stool today but constipated stool last week and heavy lifting at work. History of hemorrhoids. No current bleeding. No abdominal pain.  The history is provided by the patient. No language interpreter was used.       Prior to Admission medications   Medication Sig Start Date End Date Taking? Authorizing Provider  hydrocortisone  (ANUSOL -HC) 2.5 % rectal cream Place 1 Application rectally 2 (two) times daily. 09/28/23  Yes Lindsey Hommel, Margit, PA-C  amLODipine  (NORVASC ) 10 MG tablet Take 1 tablet (10 mg total) by mouth daily. 12/18/21   Rojelio Nest, DO  aspirin  EC 81 MG EC tablet Take 1 tablet (81 mg total) by mouth daily. 02/12/15   Singh, Prashant K, MD  Blood Pressure Monitoring ([employer] BLOOD PRESSURE MONITOR) KIT Check blood pressure at rest, same time, daily 12/17/21   Rojelio Nest, DO  carvedilol (COREG) 25 MG tablet Take 25 mg by mouth daily. 03/30/22 10/19/23  [provider]  hydroxychloroquine  (PLAQUENIL ) 200 MG tablet Take 2 tablets (400 mg total) by mouth daily. 12/09/16   Akula, Vijaya, MD  methocarbamol  (ROBAXIN ) 500 MG tablet Take 1 tablet (500 mg total) by mouth 2 (two) times daily. 07/28/23   Beverley Leita LABOR, PA-C    Allergies: Phenergan  [promethazine ]    Review of Systems  Updated Vital Signs BP (!) 141/95 (BP Location: Left Arm)   Pulse 65   Temp 98.1 F (36.7 C) (Oral)   Resp 18   Ht 5' 5 (1.651 m)   Wt 87.5 kg   SpO2 98%   BMI 32.12 kg/m   Physical Exam Constitutional:      Appearance: He is well-developed.  Pulmonary:     Effort: Pulmonary effort is normal.  Genitourinary:    Comments: Large external hemorrhoid without thrombosis.  Musculoskeletal:        General: Normal range  of motion.     Cervical back: Normal range of motion.   Skin:    General: Skin is warm and dry.   Neurological:     Mental Status: He is alert and oriented to person, place, and time.     (all labs ordered are listed, but only abnormal results are displayed) Labs Reviewed - No data to display  EKG: None  Radiology: No results found.   Procedures   Medications Ordered in the ED - No data to display                                  Medical Decision Making       Final diagnoses:  External hemorrhoids    ED Discharge Orders          Ordered    hydrocortisone  (ANUSOL -HC) 2.5 % rectal cream  2 times daily        09/28/23 0906               Odell Margit, PA-C 09/28/23 0908    Elnor Jayson LABOR, DO 09/28/23 1512

## 2023-10-02 ENCOUNTER — Ambulatory Visit: Admitting: Family Medicine

## 2023-10-17 ENCOUNTER — Ambulatory Visit: Admitting: Family Medicine

## 2024-02-01 ENCOUNTER — Emergency Department (HOSPITAL_COMMUNITY)
Admission: EM | Admit: 2024-02-01 | Discharge: 2024-02-01 | Disposition: A | Discharge status: Home / Self Care | Attending: Emergency Medicine | Admitting: Emergency Medicine

## 2024-02-01 DIAGNOSIS — I10 Essential (primary) hypertension: Secondary | ICD-10-CM | POA: Diagnosis present

## 2024-02-01 DIAGNOSIS — Z79899 Other long term (current) drug therapy: Secondary | ICD-10-CM | POA: Diagnosis not present

## 2024-02-01 DIAGNOSIS — Z7982 Long term (current) use of aspirin: Secondary | ICD-10-CM | POA: Insufficient documentation

## 2024-02-01 NOTE — Discharge Instructions (Signed)

## 2024-02-01 NOTE — ED Triage Notes (Signed)
 BIB EMS from home - took BP at home with device to find 150/110. Has not taken his amlodipine  and lisinopril since February for HBP, deciding to take dark chocolate to go the more holistic route. EMS BP was 170/p. HR 90's.

## 2024-02-01 NOTE — ED Provider Notes (Signed)
 [place] EMERGENCY DEPARTMENT AT [facility] Provider Note   CSN: [id number] Arrival date & time: 02/01/24  1155     Patient presents with: Hypertension   Dan Adams is a 33 y.o. male with history of hypertension and anxiety presents via EMS with complaints of elevated blood pressure.  Patient states that he took his blood pressure at home was 150.  States he got nervous and took it again and it was 170.  He is asymptomatic without any significant headache, vision changes, chest pain or shortness of breath.  Reports that he has not been taking his blood pressure medication and that he has been trying to go to the  holistic route by eating dark chocolate.  Reports that he is supposed to be taking amlodipine  and lisinopril.    Hypertension      Past Medical History:  Diagnosis Date   Anxiety    Childhood asthma    Chronic back pain    Depression    Hypertension    Leukopenia 09/07/2013   Lupus    Migraine    @ least 2-3 times/wk now (02/10/2015)   RA (rheumatoid arthritis) (HCC)    feet, fingers, knees; it's from my lupus (02/10/2015)   Stroke-like symptoms 02/09/2015-02/10/2015   right-left   Systemic lupus (HCC)    Thrombocytopenia, unspecified 09/07/2013   Past Surgical History:  Procedure Laterality Date   IR THORACENTESIS ASP PLEURAL SPACE W/IMG GUIDE  12/07/2016   TONSILLECTOMY AND ADENOIDECTOMY  2007     Prior to Admission medications   Medication Sig Start Date End Date Taking? Authorizing Provider  amLODipine  (NORVASC ) 10 MG tablet Take 1 tablet (10 mg total) by mouth daily. 12/18/21   Rojelio Nest, DO  aspirin  EC 81 MG EC tablet Take 1 tablet (81 mg total) by mouth daily. 02/12/15   Singh, Prashant K, MD  Blood Pressure Monitoring ([employer] BLOOD PRESSURE MONITOR) KIT Check blood pressure at rest, same time, daily 12/17/21   Rojelio Nest, DO  carvedilol (COREG) 25 MG tablet Take 25 mg by mouth daily. 03/30/22 10/19/23  [provider]  hydrocortisone  (ANUSOL -HC) 2.5 % rectal cream Place 1 Application rectally 2 (two) times daily. 09/28/23   Odell Balls, PA-C  hydroxychloroquine  (PLAQUENIL ) 200 MG tablet Take 2 tablets (400 mg total) by mouth daily. 12/09/16   Akula, Vijaya, MD  methocarbamol  (ROBAXIN ) 500 MG tablet Take 1 tablet (500 mg total) by mouth 2 (two) times daily. 07/28/23   Beverley Leita LABOR, PA-C    Allergies: Phenergan  [promethazine ]    Review of Systems  All other systems reviewed and are negative.   Updated Vital Signs BP (!) 148/91 (BP Location: Right Arm)   Pulse 70   Temp 98.7 F (37.1 C) (Oral)   Resp 18   SpO2 100%   Physical Exam Vitals and nursing note reviewed.  Constitutional:      General: He is not in acute distress.    Appearance: He is well-developed.  HENT:     Head: Normocephalic and atraumatic.  Eyes:     Conjunctiva/sclera: Conjunctivae normal.  Cardiovascular:     Rate and Rhythm: Normal rate and regular rhythm.     Heart sounds: No murmur heard. Pulmonary:     Effort: Pulmonary effort is normal. No respiratory distress.     Breath sounds: Normal breath sounds.  Abdominal:     Palpations: Abdomen is soft.     Tenderness: There is no abdominal tenderness.  Musculoskeletal:  General: No swelling.     Cervical back: Neck supple.  Skin:    General: Skin is warm and dry.     Capillary Refill: Capillary refill takes less than 2 seconds.  Neurological:     Mental Status: He is alert.     Comments: Patient is alert and oriented. There is no abnormal phonation. Symmetric smile without facial droop.  Moves all extremities spontaneously. 5/5 strength in upper and lower extremities. . No sensation deficit. There is no nystagmus. EOMI, PERRL. normal ambulation.    Psychiatric:        Mood and Affect: Mood normal.     (all labs ordered are listed, but only abnormal results are displayed) Labs Reviewed - No data to display  EKG: None  Radiology: No results  found.   Procedures   Medications Ordered in the ED - No data to display  Clinical Course as of 02/01/24 1227  Sat Feb 01, 2024  1222 Patient with history of hypertension anxiety evaluated for elevated blood pressure in the setting of noncompliance.  Upon arrival he is 148/91, otherwise hemodynamically stable.  He is asymptomatic.  He has no neurodeficits on exam.  Do not feel that any lab work or imaging is indicated today.  Patient is supposed to be on amlodipine  and lisinopril.  He has his amlodipine  here today.  He will take his dose of 10 mg here.  Encouraged on compliance and close PCP follow-up.  Patient is understanding agreement with plan. [JT]    Clinical Course User Index [JT] Donnajean Lynwood DEL, PA-C                                 Medical Decision Making  This patient presents to the ED with chief complaint(s) of hypertension .  The complaint involves an extensive differential diagnosis and also carries with it a high risk of complications and morbidity.   Pertinent past medical history as listed in HPI  The differential diagnosis includes  Exam and history not consistent with subarachnoid hemorrhage, CVA, TIA, ACS. Additional history obtained: Records reviewed Care Everywhere/External Records  Disposition:   Patient will be discharged home. The patient has been appropriately medically screened and/or stabilized in the ED. I have low suspicion for any other emergent medical condition which would require further screening, evaluation or treatment in the ED or require inpatient management. At time of discharge the patient is hemodynamically stable and in no acute distress. I have discussed work-up results and diagnosis with patient and answered all questions. Patient is agreeable with discharge plan. We discussed strict return precautions for returning to the emergency department and they verbalized understanding.     Social Determinants of Health:   none  This note was  dictated with voice recognition software.  Despite best efforts at proofreading, errors may have occurred which can change the documentation meaning.       Final diagnoses:  Uncontrolled hypertension    ED Discharge Orders     None          Donnajean Lynwood DEL DEVONNA 02/01/24 1227    Laurice Maude BROCKS, MD 02/01/24 1350

## 2024-02-01 NOTE — ED Notes (Signed)
 Pt has home supply of amlodipine  that PA instructed him to take. Water provided for same. Pt in NAD, a/o x4, and without complaint.
